# Patient Record
Sex: Female | Born: 1985
Health system: Southern US, Community
[De-identification: ages and names within clinical notes are randomized; demographics above are authoritative.]

## PROBLEM LIST (undated history)

## (undated) ENCOUNTER — Emergency Department (HOSPITAL_COMMUNITY): Discharge status: Absent without leave | Payer: Medicaid Other

## (undated) ENCOUNTER — Inpatient Hospital Stay (HOSPITAL_COMMUNITY): Payer: Self-pay

## (undated) DIAGNOSIS — Z789 Other specified health status: Secondary | ICD-10-CM

## (undated) DIAGNOSIS — IMO0002 Reserved for concepts with insufficient information to code with codable children: Secondary | ICD-10-CM

## (undated) DIAGNOSIS — O24419 Gestational diabetes mellitus in pregnancy, unspecified control: Secondary | ICD-10-CM

## (undated) DIAGNOSIS — E7800 Pure hypercholesterolemia, unspecified: Secondary | ICD-10-CM

## (undated) DIAGNOSIS — E119 Type 2 diabetes mellitus without complications: Secondary | ICD-10-CM

## (undated) DIAGNOSIS — E1165 Type 2 diabetes mellitus with hyperglycemia: Secondary | ICD-10-CM

## (undated) DIAGNOSIS — Z9289 Personal history of other medical treatment: Secondary | ICD-10-CM

## (undated) DIAGNOSIS — E0590 Thyrotoxicosis, unspecified without thyrotoxic crisis or storm: Secondary | ICD-10-CM

## (undated) HISTORY — DX: Personal history of other medical treatment: Z92.89

## (undated) HISTORY — DX: Type 2 diabetes mellitus without complications: E11.9

## (undated) HISTORY — DX: Reserved for concepts with insufficient information to code with codable children: IMO0002

## (undated) HISTORY — DX: Type 2 diabetes mellitus with hyperglycemia: E11.65

## (undated) HISTORY — PX: NO PAST SURGERIES: SHX2092

## (undated) HISTORY — DX: Pure hypercholesterolemia, unspecified: E78.00

## (undated) HISTORY — DX: Thyrotoxicosis, unspecified without thyrotoxic crisis or storm: E05.90

---

## 2007-01-29 DIAGNOSIS — Z9289 Personal history of other medical treatment: Secondary | ICD-10-CM

## 2007-01-29 HISTORY — DX: Personal history of other medical treatment: Z92.89

## 2008-03-17 ENCOUNTER — Ambulatory Visit (HOSPITAL_COMMUNITY): Admission: RE | Admit: 2008-03-17 | Discharge: 2008-03-17 | Payer: Self-pay | Admitting: Obstetrics & Gynecology

## 2008-08-09 ENCOUNTER — Inpatient Hospital Stay (HOSPITAL_COMMUNITY): Admission: AD | Admit: 2008-08-09 | Discharge: 2008-08-12 | Payer: Self-pay | Admitting: Family Medicine

## 2008-08-09 ENCOUNTER — Ambulatory Visit: Payer: Self-pay | Admitting: Advanced Practice Midwife

## 2010-03-19 ENCOUNTER — Inpatient Hospital Stay (INDEPENDENT_AMBULATORY_CARE_PROVIDER_SITE_OTHER)
Admission: RE | Admit: 2010-03-19 | Discharge: 2010-03-19 | Disposition: A | Discharge status: Home / Self Care | Payer: Self-pay | Source: Ambulatory Visit | Attending: Emergency Medicine | Admitting: Emergency Medicine

## 2010-03-19 DIAGNOSIS — L509 Urticaria, unspecified: Secondary | ICD-10-CM

## 2010-05-06 LAB — CBC
Platelets: 169 10*3/uL (ref 150–400)
RBC: 4.4 MIL/uL (ref 3.87–5.11)
RDW: 14.8 % (ref 11.5–15.5)
WBC: 10.4 10*3/uL (ref 4.0–10.5)
WBC: 9.6 10*3/uL (ref 4.0–10.5)

## 2010-05-06 LAB — URINE MICROSCOPIC-ADD ON

## 2010-05-06 LAB — URINALYSIS, ROUTINE W REFLEX MICROSCOPIC
Nitrite: NEGATIVE
Specific Gravity, Urine: <1.005 — ABNORMAL LOW (ref 1.005–1.030)
Urobilinogen, UA: 0.2 mg/dL (ref 0.0–1.0)
pH: 6 (ref 5.0–8.0)

## 2011-01-29 DIAGNOSIS — O24419 Gestational diabetes mellitus in pregnancy, unspecified control: Secondary | ICD-10-CM

## 2011-01-29 HISTORY — DX: Gestational diabetes mellitus in pregnancy, unspecified control: O24.419

## 2011-03-31 ENCOUNTER — Inpatient Hospital Stay (HOSPITAL_COMMUNITY)
Admission: AD | Admit: 2011-03-31 | Discharge: 2011-03-31 | Disposition: A | Discharge status: Home / Self Care | Payer: Medicaid Other | Source: Ambulatory Visit | Attending: Obstetrics & Gynecology | Admitting: Obstetrics & Gynecology

## 2011-03-31 ENCOUNTER — Inpatient Hospital Stay (HOSPITAL_COMMUNITY): Payer: Medicaid Other

## 2011-03-31 ENCOUNTER — Encounter (HOSPITAL_COMMUNITY): Payer: Self-pay

## 2011-03-31 ENCOUNTER — Emergency Department (INDEPENDENT_AMBULATORY_CARE_PROVIDER_SITE_OTHER)
Admission: EM | Admit: 2011-03-31 | Discharge: 2011-03-31 | Disposition: A | Discharge status: Other Acute Inpatient Hospital | Payer: Self-pay | Source: Home / Self Care | Attending: Emergency Medicine | Admitting: Emergency Medicine

## 2011-03-31 ENCOUNTER — Encounter (HOSPITAL_COMMUNITY): Payer: Self-pay | Admitting: *Deleted

## 2011-03-31 DIAGNOSIS — O209 Hemorrhage in early pregnancy, unspecified: Secondary | ICD-10-CM

## 2011-03-31 DIAGNOSIS — O039 Complete or unspecified spontaneous abortion without complication: Secondary | ICD-10-CM

## 2011-03-31 HISTORY — DX: Other specified health status: Z78.9

## 2011-03-31 LAB — POCT URINALYSIS DIP (DEVICE)
Bilirubin Urine: NEGATIVE
Glucose, UA: 500 mg/dL — AB
Ketones, ur: NEGATIVE mg/dL
Specific Gravity, Urine: 1.015 (ref 1.005–1.030)

## 2011-03-31 LAB — CBC
HCT: 38.5 % (ref 36.0–46.0)
Hemoglobin: 12.8 g/dL (ref 12.0–15.0)
MCV: 75.3 fL — ABNORMAL LOW (ref 78.0–100.0)
Platelets: 213 10*3/uL (ref 150–400)
RBC: 5.11 MIL/uL (ref 3.87–5.11)
WBC: 8.2 10*3/uL (ref 4.0–10.5)

## 2011-03-31 NOTE — Discharge Instructions (Signed)
Go to [facility] now. You need to make sure that the pregnancy is seen the right place. They are expecting you.

## 2011-03-31 NOTE — ED Provider Notes (Signed)
History     Chief Complaint  Patient presents with  . Vaginal Bleeding   HPI 26 y.o. G2P1 at Unknown EGA with low back pain and vaginal bleeding x 2 days. Heavier bleeding yesterday, today spotting. No abd pain. Presented to Urgent Care today, sent to MAU for further eval.    Past Medical History  Diagnosis Date  . No pertinent past medical history     No past surgical history on file.  No family history on file.  History  Substance Use Topics  . Smoking status: Never Smoker   . Smokeless tobacco: Not on file  . Alcohol Use: No    Allergies: No Known Allergies  Prescriptions prior to admission  Medication Sig Dispense Refill  . Multiple Vitamin (MULTIVITAMIN) capsule Take 1 capsule by mouth daily.        Review of Systems  Constitutional: Negative.   Respiratory: Negative.   Cardiovascular: Negative.   Gastrointestinal: Negative for nausea, vomiting, abdominal pain, diarrhea and constipation.  Genitourinary: Negative for dysuria, urgency, frequency, hematuria and flank pain.       Positive for vaginal bleeding  Musculoskeletal: Positive for back pain.  Neurological: Negative.   Psychiatric/Behavioral: Negative.    Physical Exam   Blood pressure 119/59, pulse 75, temperature 99 F (37.2 C), temperature source Oral, resp. rate 18, height 5\' 2"  (1.575 m), weight 157 lb 9.6 oz (71.487 kg), last menstrual period 01/21/2011.  Physical Exam  Constitutional: She is oriented to person, place, and time. She appears well-developed and well-nourished. No distress.  HENT:  Head: Normocephalic and atraumatic.  Cardiovascular: Normal rate, regular rhythm and normal heart sounds.   Respiratory: Effort normal and breath sounds normal. No respiratory distress.  GI: Soft. Bowel sounds are normal. She exhibits no distension and no mass. There is no tenderness. There is no rebound and no guarding.  Genitourinary: There is no rash or lesion on the right labia. There is no rash or  lesion on the left labia. Uterus is not deviated, not enlarged, not fixed and not tender. Cervix exhibits no motion tenderness, no discharge and no friability. Right adnexum displays no mass, no tenderness and no fullness. Left adnexum displays no mass, no tenderness and no fullness. There is bleeding (moderate) around the vagina. No erythema or tenderness around the vagina. No vaginal discharge found.       Cervix sl open  Neurological: She is alert and oriented to person, place, and time.  Skin: Skin is warm and dry.  Psychiatric: She has a normal mood and affect.    MAU Course  Procedures  Results for orders placed during the hospital encounter of 03/31/11 (from the past 24 hour(s))  CBC     Status: Abnormal   Collection Time   03/31/11 12:40 PM      Component Value Range   WBC 8.2  4.0 - 10.5 (K/uL)   RBC 5.11  3.87 - 5.11 (MIL/uL)   Hemoglobin 12.8  12.0 - 15.0 (g/dL)   HCT 56.2  13.0 - 86.5 (%)   MCV 75.3 (*) 78.0 - 100.0 (fL)   MCH 25.0 (*) 26.0 - 34.0 (pg)   MCHC 33.2  30.0 - 36.0 (g/dL)   RDW 78.4  69.6 - 29.5 (%)   Platelets 213  150 - 400 (K/uL)  ABO/RH     Status: Normal   Collection Time   03/31/11 12:40 PM      Component Value Range   ABO/RH(D) B POS  HCG, QUANTITATIVE, PREGNANCY     Status: Abnormal   Collection Time   03/31/11 12:40 PM      Component Value Range   hCG, Beta Chain, Quant, S 9282 (*) <5 (mIU/mL)   U/s: 6.3 week fetal pole, no cardiac activity, large subchorionic bleeding c/w failed IUP  Assessment and Plan  26 y.o. G2P1 with SAB at 6.[redacted] weeks EGA Offered Cytotec vs. Expectant mgmt, pt desires expectant management at this time F/U in MAU in 2 weeks, precautions rev'd  Elodie Panameno 03/31/2011, 1:23 PM

## 2011-03-31 NOTE — Progress Notes (Signed)
Pt report having vaginal bleeding x 2 days with back pain. Went to [facility] today and was sent to MAU for further evaluation.

## 2011-03-31 NOTE — ED Provider Notes (Signed)
Medical Screening exam and patient care preformed by advanced practice provider.  Agree with the above management.  

## 2011-03-31 NOTE — ED Provider Notes (Signed)
History     CSN: [id number]  Arrival date & time 03/31/11  7829   First MD Initiated Contact with Patient 03/31/11 [phone]      Chief Complaint  Patient presents with  . Possible Pregnancy    pt request preg test    (Consider location/radiation/quality/duration/timing/severity/associated sxs/prior treatment) HPI Comments: Patient is a G2 P1 who presents for painless vaginal bleeding starting yesterday. Reports some mild bilateral lower back pain. No nausea, vomiting, fevers, dysuria, urinary urgency, frequency, vaginal discharge. Patient denies having intercourse prior to the vaginal bleeding starting. Patient is 9 weeks 6 days pregnant by LMP, this diagnosis being pregnant about 2 weeks ago. Has had no prenatal care or ultrasound confirming location of pregnancy. No history of STDs, PID, ectopic pregnancy.  Patient is a 26 y.o. female presenting with vaginal bleeding. The history is provided by the spouse and the patient. The history is limited by a language barrier. No language interpreter was used.  Vaginal Bleeding This is a new problem. The current episode started yesterday. The problem has not changed since onset.Pertinent negatives include no abdominal pain. She has tried nothing for the symptoms. The treatment provided no relief.    History reviewed. No pertinent past medical history.  History reviewed. No pertinent past surgical history.  History reviewed. No pertinent family history.  History  Substance Use Topics  . Smoking status: Never Smoker   . Smokeless tobacco: Not on file  . Alcohol Use: No    OB History    Grav Para Term Preterm Abortions TAB SAB Ect Mult Living   2 1        1       Review of Systems  Constitutional: Negative for fever.  Gastrointestinal: Negative for nausea, vomiting and abdominal pain.  Genitourinary: Positive for vaginal bleeding. Negative for dysuria, frequency, hematuria, flank pain, vaginal pain and pelvic pain.  Neurological:  Negative for syncope.    Allergies  Review of patient's allergies indicates no known allergies.  Home Medications   Current Outpatient Rx  Name Route Sig Dispense Refill  . MULTIVITAMINS PO CAPS Oral Take 1 capsule by mouth daily.      BP 121/53  Pulse 77  Temp(Src) 98.6 F (37 C) (Oral)  Resp 18  SpO2 100%  LMP 01/21/2011  Physical Exam  Nursing note and vitals reviewed. Constitutional: She is oriented to person, place, and time. She appears well-developed and well-nourished. No distress.  HENT:  Head: Normocephalic and atraumatic.  Eyes: Conjunctivae and EOM are normal.  Neck: Normal range of motion.  Cardiovascular: Normal rate, regular rhythm and normal heart sounds.   Pulmonary/Chest: Effort normal and breath sounds normal.  Abdominal: Soft. Normal appearance and bowel sounds are normal. She exhibits no distension. There is no tenderness. There is no CVA tenderness.  Musculoskeletal: Normal range of motion.  Neurological: She is alert and oriented to person, place, and time.  Skin: Skin is warm and dry.  Psychiatric: She has a normal mood and affect. Her behavior is normal. Judgment and thought content normal.    ED Course  Procedures (including critical care time)  Labs Reviewed  POCT URINALYSIS DIP (DEVICE) - Abnormal; Notable for the following:    Glucose, UA 500 (*)    Hgb urine dipstick LARGE (*)    All other components within normal limits  POCT PREGNANCY, URINE - Abnormal; Notable for the following:    Preg Test, Ur POSITIVE (*)    All other components within normal limits  No results found.   1. Vaginal bleeding before [redacted] weeks gestation       MDM  Patient has had no prenatal care. Transferring to women's to rule out ectopic. Discussed case with intake nurse.   Luiz Blare, MD 03/31/11 [phone]

## 2011-03-31 NOTE — ED Notes (Signed)
Pt states she was seen 2 weeks ago and had a positive pregnancy test, they told her she was two months pregnant , states she started bleeding yesterday.

## 2011-04-01 LAB — GC/CHLAMYDIA PROBE AMP, GENITAL
Chlamydia, DNA Probe: NEGATIVE
GC Probe Amp, Genital: NEGATIVE

## 2011-04-14 ENCOUNTER — Inpatient Hospital Stay (HOSPITAL_COMMUNITY)
Admission: AD | Admit: 2011-04-14 | Discharge: 2011-04-14 | Disposition: A | Discharge status: Home / Self Care | Payer: Medicaid Other | Source: Ambulatory Visit | Attending: Obstetrics & Gynecology | Admitting: Obstetrics & Gynecology

## 2011-04-14 DIAGNOSIS — O039 Complete or unspecified spontaneous abortion without complication: Secondary | ICD-10-CM | POA: Insufficient documentation

## 2011-04-14 NOTE — MAU Note (Signed)
Pt here for f/u BHCG. Denies pain or vaginal bleeding.

## 2011-04-14 NOTE — MAU Provider Note (Signed)
  History     CSN: [id number]  Arrival date and time: 04/14/11 0913   None     Chief Complaint  Patient presents with  . Follow-up   HPI 26 y.o. G2P1 here for f/u quant 2 weeks s/p SAB. States bleeding stopped 1 week ago, denies pain or other complaints or concerns. Pt was seen in MAU with vaginal bleeding on 3/3, quant was 9282 at that time, u/s showed 6.3 week IUP with no cardiac activity. Pt opted for expectant management.     Past Medical History  Diagnosis Date  . No pertinent past medical history     No past surgical history on file.  No family history on file.  History  Substance Use Topics  . Smoking status: Never Smoker   . Smokeless tobacco: Not on file  . Alcohol Use: No    Allergies: No Known Allergies  Prescriptions prior to admission  Medication Sig Dispense Refill  . Multiple Vitamin (MULTIVITAMIN) capsule Take 1 capsule by mouth daily.        Review of Systems  Constitutional: Negative.   Respiratory: Negative.   Cardiovascular: Negative.   Gastrointestinal: Negative for nausea, vomiting, abdominal pain, diarrhea and constipation.  Genitourinary: Negative for dysuria, urgency, frequency, hematuria and flank pain.       Negative for vaginal bleeding, vaginal discharge  Musculoskeletal: Negative.   Neurological: Negative.   Psychiatric/Behavioral: Negative.    Physical Exam   Blood pressure 122/69, pulse 74, temperature 97.4 F (36.3 C), temperature source Oral, resp. rate 18, last menstrual period 01/21/2011.  Physical Exam  Nursing note and vitals reviewed. Constitutional: She is oriented to person, place, and time. She appears well-developed and well-nourished. No distress.  Cardiovascular: Normal rate.   Respiratory: Effort normal.  Musculoskeletal: Normal range of motion.  Neurological: She is alert and oriented to person, place, and time.  Skin: Skin is warm and dry.  Psychiatric: She has a normal mood and affect.    MAU Course   Procedures  Results for orders placed during the hospital encounter of 04/14/11 (from the past 24 hour(s))  HCG, QUANTITATIVE, PREGNANCY     Status: Abnormal   Collection Time   04/14/11  9:23 AM      Component Value Range   hCG, Beta Chain, Quant, S 16 (*) <5 (mIU/mL)     Assessment and Plan  26 y.o. G2P1 with SAB F/U PRN, gave info for GYN clinic   [facility] 04/14/2011, 9:25 AM

## 2011-04-14 NOTE — ED Notes (Signed)
Veronica Love,CNM discussed results with pt and husband both verbalized understanding.

## 2011-10-09 ENCOUNTER — Other Ambulatory Visit (HOSPITAL_COMMUNITY): Payer: Self-pay | Admitting: Family

## 2011-10-09 DIAGNOSIS — Z0489 Encounter for examination and observation for other specified reasons: Secondary | ICD-10-CM

## 2011-10-09 LAB — OB RESULTS CONSOLE HIV ANTIBODY (ROUTINE TESTING): HIV: NONREACTIVE

## 2011-10-09 LAB — OB RESULTS CONSOLE RUBELLA ANTIBODY, IGM: Rubella: IMMUNE

## 2011-10-09 LAB — OB RESULTS CONSOLE HEPATITIS B SURFACE ANTIGEN: Hepatitis B Surface Ag: NEGATIVE

## 2011-10-09 LAB — OB RESULTS CONSOLE PLATELET COUNT: Platelets: 172 10*3/uL

## 2011-10-09 LAB — OB RESULTS CONSOLE RPR: RPR: NONREACTIVE

## 2011-10-09 LAB — GLUCOSE, 3 HOUR

## 2011-10-09 LAB — OB RESULTS CONSOLE HGB/HCT, BLOOD: Hemoglobin: 11.9 g/dL

## 2011-10-09 LAB — SICKLE CELL SCREEN

## 2011-10-09 LAB — OB RESULTS CONSOLE ANTIBODY SCREEN: Antibody Screen: POSITIVE

## 2011-10-09 LAB — GLUCOSE, 1 HOUR: Glucose, 1 hour: 145

## 2011-10-10 LAB — OB RESULTS CONSOLE ANTIBODY SCREEN: Antibody Screen: POSITIVE

## 2011-10-14 ENCOUNTER — Ambulatory Visit (INDEPENDENT_AMBULATORY_CARE_PROVIDER_SITE_OTHER): Payer: Medicaid Other | Admitting: Obstetrics & Gynecology

## 2011-10-14 ENCOUNTER — Encounter: Payer: Medicaid Other | Attending: Obstetrics and Gynecology | Admitting: Dietician

## 2011-10-14 VITALS — Ht 60.0 in | Wt 155.8 lb

## 2011-10-14 DIAGNOSIS — O24419 Gestational diabetes mellitus in pregnancy, unspecified control: Secondary | ICD-10-CM

## 2011-10-14 DIAGNOSIS — O99810 Abnormal glucose complicating pregnancy: Secondary | ICD-10-CM

## 2011-10-14 DIAGNOSIS — Z713 Dietary counseling and surveillance: Secondary | ICD-10-CM | POA: Insufficient documentation

## 2011-10-14 LAB — POCT URINALYSIS DIP (DEVICE)
Bilirubin Urine: NEGATIVE
Leukocytes, UA: NEGATIVE
Nitrite: NEGATIVE
pH: 8.5 — ABNORMAL HIGH (ref 5.0–8.0)

## 2011-10-14 MED ORDER — ACCU-CHEK SOFTCLIX LANCETS MISC: Status: DC

## 2011-10-14 MED ORDER — GLUCOSE BLOOD VI STRP: ORAL_STRIP | Status: DC

## 2011-10-14 NOTE — Progress Notes (Signed)
Nutrition Note: 1st visit- RD/ CDE only Pt seen for GDM diet education. Pt has gained 0.8# @ ~[redacted]w[redacted]d (pt stated she is due 03/19/11), which is < expected. Pt given verbal & written GDM education with a translator Enid Derry) & vietnamese handouts. Pt reports eating 3 meals & 0-1 snack/d.  Pt reports N occ but no V.  Pt is taking PNV. Discussed wt gain goals of 11-20# or 0.5#/wk. Discussed importance of BF. Pt agrees to follow GDM diet including 3 meals & 3 snacks with proper CHO/pro combination. Pt does not receive WIC during this pregnancy but plans to apply. Pt stated she does not plan to BF but was open to at least trying in the hospital. F/u in 4-6 wks Blondell Reveal, MS, RD, LDN

## 2011-10-14 NOTE — Progress Notes (Signed)
Diabetes Education:  Seen today for GDM and blood glucose monitoring.  Assisted by the [place] interpreter.  Completed review of the recommended testing times and the glucose goals.  Provided an [employer] Lot [id number] Exp: 05/09/2014 with a Softclix lancing device and lancets Lot: [id number] Exp 2017-02.  She is to monitor fasting and two hours post-meal.  Today on return demonstration, her blood glucose fasting was 81 mg.  She was instructed to bring her meter and glucose log to all clinic appointments.  Maggie Binnie Vonderhaar, RN, RD, CDE

## 2011-10-15 DIAGNOSIS — O24419 Gestational diabetes mellitus in pregnancy, unspecified control: Secondary | ICD-10-CM | POA: Insufficient documentation

## 2011-10-16 ENCOUNTER — Other Ambulatory Visit (HOSPITAL_COMMUNITY): Payer: Self-pay | Admitting: Family

## 2011-10-16 ENCOUNTER — Ambulatory Visit (HOSPITAL_COMMUNITY)
Admission: RE | Admit: 2011-10-16 | Discharge: 2011-10-16 | Disposition: A | Discharge status: Home / Self Care | Payer: Medicaid Other | Source: Ambulatory Visit | Attending: Family | Admitting: Family

## 2011-10-16 DIAGNOSIS — O99810 Abnormal glucose complicating pregnancy: Secondary | ICD-10-CM | POA: Insufficient documentation

## 2011-10-16 DIAGNOSIS — O24419 Gestational diabetes mellitus in pregnancy, unspecified control: Secondary | ICD-10-CM

## 2011-10-16 DIAGNOSIS — Z0489 Encounter for examination and observation for other specified reasons: Secondary | ICD-10-CM

## 2011-10-16 DIAGNOSIS — Z3689 Encounter for other specified antenatal screening: Secondary | ICD-10-CM | POA: Insufficient documentation

## 2011-10-17 DIAGNOSIS — O24419 Gestational diabetes mellitus in pregnancy, unspecified control: Secondary | ICD-10-CM

## 2011-10-21 ENCOUNTER — Encounter: Payer: Self-pay | Admitting: Obstetrics & Gynecology

## 2011-10-21 ENCOUNTER — Ambulatory Visit (INDEPENDENT_AMBULATORY_CARE_PROVIDER_SITE_OTHER): Payer: Medicaid Other | Admitting: Obstetrics & Gynecology

## 2011-10-21 VITALS — BP 123/79 | Temp 98.0°F | Wt 156.5 lb

## 2011-10-21 DIAGNOSIS — O24419 Gestational diabetes mellitus in pregnancy, unspecified control: Secondary | ICD-10-CM

## 2011-10-21 DIAGNOSIS — O0990 Supervision of high risk pregnancy, unspecified, unspecified trimester: Secondary | ICD-10-CM

## 2011-10-21 DIAGNOSIS — O99810 Abnormal glucose complicating pregnancy: Secondary | ICD-10-CM

## 2011-10-21 DIAGNOSIS — O2340 Unspecified infection of urinary tract in pregnancy, unspecified trimester: Secondary | ICD-10-CM | POA: Insufficient documentation

## 2011-10-21 DIAGNOSIS — B951 Streptococcus, group B, as the cause of diseases classified elsewhere: Secondary | ICD-10-CM

## 2011-10-21 DIAGNOSIS — O2390 Unspecified genitourinary tract infection in pregnancy, unspecified trimester: Secondary | ICD-10-CM

## 2011-10-21 DIAGNOSIS — Z23 Encounter for immunization: Secondary | ICD-10-CM

## 2011-10-21 DIAGNOSIS — N390 Urinary tract infection, site not specified: Secondary | ICD-10-CM

## 2011-10-21 LAB — OB RESULTS CONSOLE GBS: GBS: POSITIVE

## 2011-10-21 LAB — POCT URINALYSIS DIP (DEVICE)
Bilirubin Urine: NEGATIVE
Hgb urine dipstick: NEGATIVE
Nitrite: NEGATIVE
Protein, ur: NEGATIVE mg/dL
pH: 5.5 (ref 5.0–8.0)

## 2011-10-21 MED ORDER — INFLUENZA VIRUS VACC SPLIT PF IM SUSP
0.5000 mL | INTRAMUSCULAR | Status: AC
  Administered 2011-10-21: 0.5 mL via INTRAMUSCULAR

## 2011-10-21 NOTE — Patient Instructions (Addendum)
Gestational Diabetes Mellitus Gestational diabetes mellitus (GDM) is diabetes that occurs only during pregnancy. This happens when the body cannot properly handle the glucose (sugar) that increases in the blood after eating. During pregnancy, insulin resistance (reduced sensitivity to insulin) occurs because of the release of hormones from the placenta. Usually, the pancreas of pregnant women produces enough insulin to overcome the resistance that occurs. However, in gestational diabetes, the insulin is there but it does not work effectively. If the resistance is severe enough that the pancreas does not produce enough insulin, extra glucose builds up in the blood.  WHO IS AT RISK FOR DEVELOPING GESTATIONAL DIABETES?  Women with a history of diabetes in the family.   Women over age 25.   Women who are overweight.   Women in certain ethnic groups (Hispanic, African American, Native American, Asian and Pacific Islander).  WHAT CAN HAPPEN TO THE BABY? If the mother's blood glucose is too high while she is pregnant, the extra sugar will travel through the umbilical cord to the baby. Some of the problems the baby may have are:  Large Baby - If the baby receives too much sugar, the baby will gain more weight. This may cause the baby to be too large to be born normally (vaginally) and a Cesarean section (C-section) may be needed.   Low Blood Glucose (hypoglycemia) - The baby makes extra insulin, in response to the extra sugar its gets from its mother. When the baby is born and no longer needs this extra insulin, the baby's blood glucose level may drop.   Jaundice (yellow coloring of the skin and eyes) - This is fairly common in babies. It is caused from a build-up of the chemical called bilirubin. This is rarely serious, but is seen more often in babies whose mothers had gestational diabetes.  RISKS TO THE MOTHER Women who have had gestational diabetes may be at higher risk for some problems,  including:  Preeclampsia or toxemia, which includes problems with high blood pressure. Blood pressure and protein levels in the urine must be checked frequently.   Infections.   Cesarean section (C-section) for delivery.   Developing Type 2 diabetes later in life. About 30-50% will develop diabetes later, especially if obese.  DIAGNOSIS  The hormones that cause insulin resistance are highest at about 24-28 weeks of pregnancy. If symptoms are experienced, they are much like symptoms you would normally expect during pregnancy.  GDM is often diagnosed using a two part method: 1. After 24-28 weeks of pregnancy, the woman drinks a glucose solution and takes a blood test. If the glucose level is high, a second test will be given.  2. Oral Glucose Tolerance Test (OGTT) which is 3 hours long - After not eating overnight, the blood glucose is checked. The woman drinks a glucose solution, and hourly blood glucose tests are taken.  If the woman has risk factors for GDM, the caregiver may test earlier than 24 weeks of pregnancy. TREATMENT  Treatment of GDM is directed at keeping the mother's blood glucose level normal, and may include:  Meal planning.   Taking insulin or other medicine to control your blood glucose level.   Exercise.   Keeping a daily record of the foods you eat.   Blood glucose monitoring and keeping a record of your blood glucose levels.   May monitor ketone levels in the urine, although this is no longer considered necessary in most pregnancies.  HOME CARE INSTRUCTIONS  While you are pregnant:    Follow your caregiver's advice regarding your prenatal appointments, meal planning, exercise, medicines, vitamins, blood and other tests, and physical activities.   Keep a record of your meals, blood glucose tests, and the amount of insulin you are taking (if any). Show this to your caregiver at every prenatal visit.   If you have GDM, you may have problems with hypoglycemia (low  blood glucose). You may suspect this if you become suddenly dizzy, feel shaky, and/or weak. If you think this is happening and you have a glucose meter, try to test your blood glucose level. Follow your caregiver's advice for when and how to treat your low blood glucose. Generally, the 15:15 rule is followed: Treat by consuming 15 grams of carbohydrates, wait 15 minutes, and recheck blood glucose. Examples of 15 grams of carbohydrates are:   1 cup skim or low-fat milk.    cup juice.   3-4 glucose tablets.   5-6 hard candies.   1 small box raisins.    cup regular soda pop.   Practice good hygiene, to avoid infections.   Do not smoke.  SEEK MEDICAL CARE IF:   You develop abnormal vaginal discharge, with or without itching.   You become weak and tired more than expected.   You seem to sweat a lot.   You have a sudden increase in weight, 5 pounds or more in one week.   You are losing weight, 3 pounds or more in a week.   Your blood glucose level is high, and you need instructions on what to do about it.  SEEK IMMEDIATE MEDICAL CARE IF:   You develop a severe headache.   You faint or pass out.   You develop nausea and vomiting.   You become disoriented or confused.   You have a convulsion.   You develop vision problems.   You develop stomach pain.   You develop vaginal bleeding.   You develop uterine contractions.   You have leaking or a gush of fluid from the vagina.  AFTER YOU HAVE THE BABY:  Go to all of your follow-up appointments, and have blood tests as advised by your caregiver.   Maintain a healthy lifestyle, to prevent diabetes in the future. This includes:   Following a healthy meal plan.   Controlling your weight.   Getting enough exercise and proper rest.   Do not smoke.   Breastfeed your baby if you can. This will lower the chance of you and your baby developing diabetes later in life.  For more information about diabetes, go to the American  Diabetes Association at: www.americandiabetesassociation.org. For more information about gestational diabetes, go to the American Congress of Obstetricians and Gynecologists at: www.acog.org. Document Released: 04/22/2000 Document Revised: 01/03/2011 Document Reviewed: 11/14/2008 ExitCare Patient Information 2012 [employer]. 

## 2011-10-21 NOTE — Progress Notes (Signed)
Nutrition note: f/u visit Pt has gained 1.5# @ [redacted]w[redacted]d, which is < expected. But pt gained 1.5# in 1 wk, which is > expected. Pt reports still only eating 3 meals & 1-2 snacks/ d & is often hungry. Pt stated she has noticed that her BS have been high after she eats a large amount of rice. Pt reports eating fruit alone as a snack. Pt unable to state food sources with cho vs pro only foods. Reviewed GDM diet & importance of having 3 meals & 3 snacks/ d with CHO & pro at all meals & snacks. Discussed pro sources pt could eat if hungry & has had her CHO allotment for that meal/ snack - ex nuts, cheese, egg, meat, peanut butter.   Pt agrees to decrease rice portions & add protein sources at all meals & snacks. F/u in 2-4 wks Blondell Reveal, MS, RD, LDN

## 2011-10-21 NOTE — Progress Notes (Signed)
U/S scheduled 11/04/11 at 930 am.

## 2011-10-21 NOTE — Progress Notes (Signed)
Transfer from [facility] abnormal early blood glucose, with 1 hr 145, 3 hr GTT 1 hr 236. Normal FBS now, pp are improving as she adjusts to diet. RTC 2 weeks. Detailed [place] 3 weeks.

## 2011-10-21 NOTE — Progress Notes (Signed)
Pulse 91 Desires flu vaccine, consent signed

## 2011-10-22 DIAGNOSIS — O24419 Gestational diabetes mellitus in pregnancy, unspecified control: Secondary | ICD-10-CM

## 2011-11-04 ENCOUNTER — Encounter: Payer: Medicaid Other | Admitting: Family Medicine

## 2011-11-04 ENCOUNTER — Ambulatory Visit (HOSPITAL_COMMUNITY)
Admission: RE | Admit: 2011-11-04 | Discharge: 2011-11-04 | Disposition: A | Discharge status: Home / Self Care | Payer: Medicaid Other | Source: Ambulatory Visit | Attending: Obstetrics & Gynecology | Admitting: Obstetrics & Gynecology

## 2011-11-04 ENCOUNTER — Ambulatory Visit (INDEPENDENT_AMBULATORY_CARE_PROVIDER_SITE_OTHER): Payer: Medicaid Other | Admitting: Family Medicine

## 2011-11-04 VITALS — BP 117/76 | Temp 97.5°F | Wt 156.3 lb

## 2011-11-04 DIAGNOSIS — Z363 Encounter for antenatal screening for malformations: Secondary | ICD-10-CM | POA: Insufficient documentation

## 2011-11-04 DIAGNOSIS — O358XX0 Maternal care for other (suspected) fetal abnormality and damage, not applicable or unspecified: Secondary | ICD-10-CM | POA: Insufficient documentation

## 2011-11-04 DIAGNOSIS — Z1389 Encounter for screening for other disorder: Secondary | ICD-10-CM | POA: Insufficient documentation

## 2011-11-04 DIAGNOSIS — O99810 Abnormal glucose complicating pregnancy: Secondary | ICD-10-CM | POA: Insufficient documentation

## 2011-11-04 DIAGNOSIS — O24419 Gestational diabetes mellitus in pregnancy, unspecified control: Secondary | ICD-10-CM

## 2011-11-04 LAB — POCT URINALYSIS DIP (DEVICE)
Hgb urine dipstick: NEGATIVE
Ketones, ur: NEGATIVE mg/dL
Protein, ur: NEGATIVE mg/dL
Specific Gravity, Urine: 1.01 (ref 1.005–1.030)

## 2011-11-04 MED ORDER — GLUCOSE BLOOD VI STRP: ORAL_STRIP | Status: DC

## 2011-11-04 MED ORDER — ACCU-CHEK SOFTCLIX LANCETS MISC: Status: DC

## 2011-11-04 NOTE — Progress Notes (Signed)
Fasting BS all in 70s or low 80s. PP 65-185 (5/36 over 120). Majority 70s to 100s. Continue diet control. Ultrasound (fetal survey) normal. No cramping, bleeding, headache, vision changes.

## 2011-11-04 NOTE — Patient Instructions (Signed)
Call if blood sugars are repeatedly over 95 fasting or over 120 after meals, call.   Gestational Diabetes Mellitus Gestational diabetes mellitus (GDM) is diabetes that occurs only during pregnancy. This happens when the body cannot properly handle the glucose (sugar) that increases in the blood after eating. During pregnancy, insulin resistance (reduced sensitivity to insulin) occurs because of the release of hormones from the placenta. Usually, the pancreas of pregnant women produces enough insulin to overcome the resistance that occurs. However, in gestational diabetes, the insulin is there but it does not work effectively. If the resistance is severe enough that the pancreas does not produce enough insulin, extra glucose builds up in the blood.  WHO IS AT RISK FOR DEVELOPING GESTATIONAL DIABETES?  Women with a history of diabetes in the family.  Women over age 36.  Women who are overweight.  Women in certain ethnic groups (Hispanic, African American, Native American, [place] and [place]er). WHAT CAN HAPPEN TO THE BABY? If the mother's blood glucose is too high while she is pregnant, the extra sugar will travel through the umbilical cord to the baby. Some of the problems the baby may have are:  Large Baby - If the baby receives too much sugar, the baby will gain more weight. This may cause the baby to be too large to be born normally (vaginally) and a Cesarean section (C-section) may be needed.  Low Blood Glucose (hypoglycemia)  The baby makes extra insulin, in response to the extra sugar its gets from its mother. When the baby is born and no longer needs this extra insulin, the baby's blood glucose level may drop.  Jaundice (yellow coloring of the skin and eyes)  This is fairly common in babies. It is caused from a build-up of the chemical called bilirubin. This is rarely serious, but is seen more often in babies whose mothers had gestational diabetes. RISKS TO THE MOTHER Women who have  had gestational diabetes may be at higher risk for some problems, including:  Preeclampsia or toxemia, which includes problems with high blood pressure. Blood pressure and protein levels in the urine must be checked frequently.  Infections.  Cesarean section (C-section) for delivery.  Developing Type 2 diabetes later in life. About 30-50% will develop diabetes later, especially if obese. DIAGNOSIS  The hormones that cause insulin resistance are highest at about 24-28 weeks of pregnancy. If symptoms are experienced, they are much like symptoms you would normally expect during pregnancy.  GDM is often diagnosed using a two part method: 1. After 24-28 weeks of pregnancy, the woman drinks a glucose solution and takes a blood test. If the glucose level is high, a second test will be given. 2. Oral Glucose Tolerance Test (OGTT) which is 3 hours long  After not eating overnight, the blood glucose is checked. The woman drinks a glucose solution, and hourly blood glucose tests are taken. If the woman has risk factors for GDM, the caregiver may test earlier than 24 weeks of pregnancy. TREATMENT  Treatment of GDM is directed at keeping the mother's blood glucose level normal, and may include:  Meal planning.  Taking insulin or other medicine to control your blood glucose level.  Exercise.  Keeping a daily record of the foods you eat.  Blood glucose monitoring and keeping a record of your blood glucose levels.  May monitor ketone levels in the urine, although this is no longer considered necessary in most pregnancies. HOME CARE INSTRUCTIONS  While you are pregnant:  Follow your  caregiver's advice regarding your prenatal appointments, meal planning, exercise, medicines, vitamins, blood and other tests, and physical activities.  Keep a record of your meals, blood glucose tests, and the amount of insulin you are taking (if any). Show this to your caregiver at every prenatal visit.  If you have  GDM, you may have problems with hypoglycemia (low blood glucose). You may suspect this if you become suddenly dizzy, feel shaky, and/or weak. If you think this is happening and you have a glucose meter, try to test your blood glucose level. Follow your caregiver's advice for when and how to treat your low blood glucose. Generally, the 15:15 rule is followed: Treat by consuming 15 grams of carbohydrates, wait 15 minutes, and recheck blood glucose. Examples of 15 grams of carbohydrates are:  1 cup skim or low-fat milk.   cup juice.  3-4 glucose tablets.  5-6 hard candies.  1 small box raisins.   cup regular soda pop.  Practice good hygiene, to avoid infections.  Do not smoke. SEEK MEDICAL CARE IF:   You develop abnormal vaginal discharge, with or without itching.  You become weak and tired more than expected.  You seem to sweat a lot.  You have a sudden increase in weight, 5 pounds or more in one week.  You are losing weight, 3 pounds or more in a week.  Your blood glucose level is high, and you need instructions on what to do about it. SEEK IMMEDIATE MEDICAL CARE IF:   You develop a severe headache.  You faint or pass out.  You develop nausea and vomiting.  You become disoriented or confused.  You have a convulsion.  You develop vision problems.  You develop stomach pain.  You develop vaginal bleeding.  You develop uterine contractions.  You have leaking or a gush of fluid from the vagina. AFTER YOU HAVE THE BABY:  Go to all of your follow-up appointments, and have blood tests as advised by your caregiver.  Maintain a healthy lifestyle, to prevent diabetes in the future. This includes:  Following a healthy meal plan.  Controlling your weight.  Getting enough exercise and proper rest.  Do not smoke.  Breastfeed your baby if you can. This will lower the chance of you and your baby developing diabetes later in life. For more information about diabetes,  go to the American Diabetes Association at: PMFashions.com.cy. For more information about gestational diabetes, go to the [employer] at: RentRule.com.au. Document Released: 04/22/2000 Document Revised: 04/08/2011 Document Reviewed: 11/14/2008 [facility] Patient Information 2013 [place], [place].   Pregnancy - Second Trimester The second trimester of pregnancy (3 to 6 months) is a period of rapid growth for you and your baby. At the end of the sixth month, your baby is about 9 inches long and weighs 1 1/2 pounds. You will begin to feel the baby move between 18 and 20 weeks of the pregnancy. This is called quickening. Weight gain is faster. A clear fluid (colostrum) may leak out of your breasts. You may feel small contractions of the womb (uterus). This is known as false labor or Braxton-Hicks contractions. This is like a practice for labor when the baby is ready to be born. Usually, the problems with morning sickness have usually passed by the end of your first trimester. Some women develop small dark blotches (called cholasma, mask of pregnancy) on their face that usually goes away after the baby is born. Exposure to the sun makes the blotches  worse. Acne may also develop in some pregnant women and pregnant women who have acne, may find that it goes away. PRENATAL EXAMS  Blood work may continue to be done during prenatal exams. These tests are done to check on your health and the probable health of your baby. Blood work is used to follow your blood levels (hemoglobin). Anemia (low hemoglobin) is common during pregnancy. Iron and vitamins are given to help prevent this. You will also be checked for diabetes between 24 and 28 weeks of the pregnancy. Some of the previous blood tests may be repeated.  The size of the uterus is measured during each visit. This is to make sure that the baby is continuing to grow properly according to the dates of the  pregnancy.  Your blood pressure is checked every prenatal visit. This is to make sure you are not getting toxemia.  Your urine is checked to make sure you do not have an infection, diabetes or protein in the urine.  Your weight is checked often to make sure gains are happening at the suggested rate. This is to ensure that both you and your baby are growing normally.  Sometimes, an ultrasound is performed to confirm the proper growth and development of the baby. This is a test which bounces harmless sound waves off the baby so your caregiver can more accurately determine due dates. Sometimes, a specialized test is done on the amniotic fluid surrounding the baby. This test is called an amniocentesis. The amniotic fluid is obtained by sticking a needle into the belly (abdomen). This is done to check the chromosomes in instances where there is a concern about possible genetic problems with the baby. It is also sometimes done near the end of pregnancy if an early delivery is required. In this case, it is done to help make sure the baby's lungs are mature enough for the baby to live outside of the womb. CHANGES OCCURING IN THE SECOND TRIMESTER OF PREGNANCY Your body goes through many changes during pregnancy. They vary from person to person. Talk to your caregiver about changes you notice that you are concerned about.  During the second trimester, you will likely have an increase in your appetite. It is normal to have cravings for certain foods. This varies from person to person and pregnancy to pregnancy.  Your lower abdomen will begin to bulge.  You may have to urinate more often because the uterus and baby are pressing on your bladder. It is also common to get more bladder infections during pregnancy (pain with urination). You can help this by drinking lots of fluids and emptying your bladder before and after intercourse.  You may begin to get stretch marks on your hips, abdomen, and breasts. These  are normal changes in the body during pregnancy. There are no exercises or medications to take that prevent this change.  You may begin to develop swollen and bulging veins (varicose veins) in your legs. Wearing support hose, elevating your feet for 15 minutes, 3 to 4 times a day and limiting salt in your diet helps lessen the problem.  Heartburn may develop as the uterus grows and pushes up against the stomach. Antacids recommended by your caregiver helps with this problem. Also, eating smaller meals 4 to 5 times a day helps.  Constipation can be treated with a stool softener or adding bulk to your diet. Drinking lots of fluids, vegetables, fruits, and whole grains are helpful.  Exercising is also helpful. If you have  been very active up until your pregnancy, most of these activities can be continued during your pregnancy. If you have been less active, it is helpful to start an exercise program such as walking.  Hemorrhoids (varicose veins in the rectum) may develop at the end of the second trimester. Warm sitz baths and hemorrhoid cream recommended by your caregiver helps hemorrhoid problems.  Backaches may develop during this time of your pregnancy. Avoid heavy lifting, wear low heal shoes and practice good posture to help with backache problems.  Some pregnant women develop tingling and numbness of their hand and fingers because of swelling and tightening of ligaments in the wrist (carpel tunnel syndrome). This goes away after the baby is born.  As your breasts enlarge, you may have to get a bigger bra. Get a comfortable, cotton, support bra. Do not get a nursing bra until the last month of the pregnancy if you will be nursing the baby.  You may get a dark line from your belly button to the pubic area called the linea nigra.  You may develop rosy cheeks because of increase blood flow to the face.  You may develop spider looking lines of the face, neck, arms and chest. These go away after  the baby is born. HOME CARE INSTRUCTIONS   It is extremely important to avoid all smoking, herbs, alcohol, and unprescribed drugs during your pregnancy. These chemicals affect the formation and growth of the baby. Avoid these chemicals throughout the pregnancy to ensure the delivery of a healthy infant.  Most of your home care instructions are the same as suggested for the first trimester of your pregnancy. Keep your caregiver's appointments. Follow your caregiver's instructions regarding medication use, exercise and diet.  During pregnancy, you are providing food for you and your baby. Continue to eat regular, well-balanced meals. Choose foods such as meat, fish, milk and other low fat dairy products, vegetables, fruits, and whole-grain breads and cereals. Your caregiver will tell you of the ideal weight gain.  A physical sexual relationship may be continued up until near the end of pregnancy if there are no other problems. Problems could include early (premature) leaking of amniotic fluid from the membranes, vaginal bleeding, abdominal pain, or other medical or pregnancy problems.  Exercise regularly if there are no restrictions. Check with your caregiver if you are unsure of the safety of some of your exercises. The greatest weight gain will occur in the last 2 trimesters of pregnancy. Exercise will help you:  Control your weight.  Get you in shape for labor and delivery.  Lose weight after you have the baby.  Wear a good support or jogging bra for breast tenderness during pregnancy. This may help if worn during sleep. Pads or tissues may be used in the bra if you are leaking colostrum.  Do not use hot tubs, steam rooms or saunas throughout the pregnancy.  Wear your seat belt at all times when driving. This protects you and your baby if you are in an accident.  Avoid raw meat, uncooked cheese, cat litter boxes and soil used by cats. These carry germs that can cause birth defects in the  baby.  The second trimester is also a good time to visit your dentist for your dental health if this has not been done yet. Getting your teeth cleaned is OK. Use a soft toothbrush. Brush gently during pregnancy.  It is easier to loose urine during pregnancy. Tightening up and strengthening the pelvic muscles will help  with this problem. Practice stopping your urination while you are going to the bathroom. These are the same muscles you need to strengthen. It is also the muscles you would use as if you were trying to stop from passing gas. You can practice tightening these muscles up 10 times a set and repeating this about 3 times per day. Once you know what muscles to tighten up, do not perform these exercises during urination. It is more likely to contribute to an infection by backing up the urine.  Ask for help if you have financial, counseling or nutritional needs during pregnancy. Your caregiver will be able to offer counseling for these needs as well as refer you for other special needs.  Your skin may become oily. If so, wash your face with mild soap, use non-greasy moisturizer and oil or cream based makeup. MEDICATIONS AND DRUG USE IN PREGNANCY  Take prenatal vitamins as directed. The vitamin should contain 1 milligram of folic acid. Keep all vitamins out of reach of children. Only a couple vitamins or tablets containing iron may be fatal to a baby or young child when ingested.  Avoid use of all medications, including herbs, over-the-counter medications, not prescribed or suggested by your caregiver. Only take over-the-counter or prescription medicines for pain, discomfort, or fever as directed by your caregiver. Do not use aspirin.  Let your caregiver also know about herbs you may be using.  Alcohol is related to a number of birth defects. This includes fetal alcohol syndrome. All alcohol, in any form, should be avoided completely. Smoking will cause low birth rate and premature  babies.  Street or illegal drugs are very harmful to the baby. They are absolutely forbidden. A baby born to an addicted mother will be addicted at birth. The baby will go through the same withdrawal an adult does. SEEK MEDICAL CARE IF:  You have any concerns or worries during your pregnancy. It is better to call with your questions if you feel they cannot wait, rather than worry about them. SEEK IMMEDIATE MEDICAL CARE IF:   An unexplained oral temperature above 102 F (38.9 C) develops, or as your caregiver suggests.  You have leaking of fluid from the vagina (birth canal). If leaking membranes are suspected, take your temperature and tell your caregiver of this when you call.  There is vaginal spotting, bleeding, or passing clots. Tell your caregiver of the amount and how many pads are used. Light spotting in pregnancy is common, especially following intercourse.  You develop a bad smelling vaginal discharge with a change in the color from clear to white.  You continue to feel sick to your stomach (nauseated) and have no relief from remedies suggested. You vomit blood or coffee ground-like materials.  You lose more than 2 pounds of weight or gain more than 2 pounds of weight over 1 week, or as suggested by your caregiver.  You notice swelling of your face, hands, feet, or legs.  You get exposed to [place] measles and have never had them.  You are exposed to fifth disease or chickenpox.  You develop belly (abdominal) pain. Round ligament discomfort is a common non-cancerous (benign) cause of abdominal pain in pregnancy. Your caregiver still must evaluate you.  You develop a bad headache that does not go away.  You develop fever, diarrhea, pain with urination, or shortness of breath.  You develop visual problems, blurry, or double vision.  You fall or are in a car accident or any kind of trauma.  There is mental or physical violence at home. Document Released: 01/08/2001 Document  Revised: 04/08/2011 Document Reviewed: 07/13/2008 [facility] Patient Information 2013 [place], [place].

## 2011-11-04 NOTE — Progress Notes (Signed)
Pulse: 88

## 2011-11-25 ENCOUNTER — Ambulatory Visit (INDEPENDENT_AMBULATORY_CARE_PROVIDER_SITE_OTHER): Payer: Medicaid Other | Admitting: Obstetrics and Gynecology

## 2011-11-25 VITALS — BP 114/74 | Temp 97.7°F | Wt 158.8 lb

## 2011-11-25 DIAGNOSIS — O0990 Supervision of high risk pregnancy, unspecified, unspecified trimester: Secondary | ICD-10-CM

## 2011-11-25 DIAGNOSIS — O24419 Gestational diabetes mellitus in pregnancy, unspecified control: Secondary | ICD-10-CM

## 2011-11-25 DIAGNOSIS — O99810 Abnormal glucose complicating pregnancy: Secondary | ICD-10-CM

## 2011-11-25 LAB — POCT URINALYSIS DIP (DEVICE)
Bilirubin Urine: NEGATIVE
Glucose, UA: NEGATIVE mg/dL
Hgb urine dipstick: NEGATIVE
Specific Gravity, Urine: 1.01 (ref 1.005–1.030)
Urobilinogen, UA: 0.2 mg/dL (ref 0.0–1.0)

## 2011-11-25 NOTE — Progress Notes (Signed)
Fetal Echo scheduled with Dr. Elizebeth Brooking Nov. 4th at 11 am. Auth # [id number].

## 2011-11-25 NOTE — Progress Notes (Signed)
Pulse: 89

## 2011-11-25 NOTE — Progress Notes (Signed)
CBG f 69-83 2hr pp 98-168 (4/16 abnormal mainly after dinner). Patient doing well without complaints. Advised to pay attention to diet at dinner. Will schedule fetal echo

## 2011-12-09 ENCOUNTER — Ambulatory Visit (INDEPENDENT_AMBULATORY_CARE_PROVIDER_SITE_OTHER): Payer: Medicaid Other | Admitting: Advanced Practice Midwife

## 2011-12-09 VITALS — BP 109/69 | Temp 97.8°F | Wt 159.6 lb

## 2011-12-09 DIAGNOSIS — O2390 Unspecified genitourinary tract infection in pregnancy, unspecified trimester: Secondary | ICD-10-CM

## 2011-12-09 DIAGNOSIS — O99810 Abnormal glucose complicating pregnancy: Secondary | ICD-10-CM

## 2011-12-09 DIAGNOSIS — O0990 Supervision of high risk pregnancy, unspecified, unspecified trimester: Secondary | ICD-10-CM

## 2011-12-09 LAB — POCT URINALYSIS DIP (DEVICE)
Bilirubin Urine: NEGATIVE
Hgb urine dipstick: NEGATIVE
pH: 7 (ref 5.0–8.0)

## 2011-12-09 NOTE — Progress Notes (Signed)
Pulse: 89

## 2011-12-09 NOTE — Progress Notes (Signed)
Doing well.  Good fetal movement, denies vaginal bleeding, LOF, regular contractions.  Pt forgot blood sugar log but reports fasting sugars in 90s and after meals ranging from 110-150s.  Discussed diet, need to bring log next visit to review.

## 2011-12-30 ENCOUNTER — Encounter: Payer: Self-pay | Admitting: Obstetrics & Gynecology

## 2011-12-30 ENCOUNTER — Ambulatory Visit (INDEPENDENT_AMBULATORY_CARE_PROVIDER_SITE_OTHER): Payer: Medicaid Other | Admitting: Obstetrics & Gynecology

## 2011-12-30 VITALS — BP 105/72 | Temp 97.0°F | Wt 162.5 lb

## 2011-12-30 DIAGNOSIS — O0990 Supervision of high risk pregnancy, unspecified, unspecified trimester: Secondary | ICD-10-CM

## 2011-12-30 DIAGNOSIS — O24419 Gestational diabetes mellitus in pregnancy, unspecified control: Secondary | ICD-10-CM

## 2011-12-30 DIAGNOSIS — O99810 Abnormal glucose complicating pregnancy: Secondary | ICD-10-CM

## 2011-12-30 LAB — POCT URINALYSIS DIP (DEVICE)
Ketones, ur: 15 mg/dL — AB
Protein, ur: NEGATIVE mg/dL
Specific Gravity, Urine: 1.01 (ref 1.005–1.030)
pH: 7 (ref 5.0–8.0)

## 2011-12-30 NOTE — Progress Notes (Signed)
Pulse- 96 

## 2011-12-30 NOTE — Progress Notes (Signed)
Routine visit. Good FM. No OB problems. Good sugars on no meds. Almost all fbs less than 90 and 2 hours less than 130. Routine labs at next visit.

## 2012-01-13 ENCOUNTER — Ambulatory Visit (INDEPENDENT_AMBULATORY_CARE_PROVIDER_SITE_OTHER): Payer: Self-pay | Admitting: Family

## 2012-01-13 ENCOUNTER — Encounter: Payer: Self-pay | Attending: Obstetrics & Gynecology | Admitting: Dietician

## 2012-01-13 VITALS — BP 98/66 | Temp 98.1°F | Wt 162.7 lb

## 2012-01-13 DIAGNOSIS — O24419 Gestational diabetes mellitus in pregnancy, unspecified control: Secondary | ICD-10-CM

## 2012-01-13 DIAGNOSIS — O0990 Supervision of high risk pregnancy, unspecified, unspecified trimester: Secondary | ICD-10-CM

## 2012-01-13 DIAGNOSIS — O99810 Abnormal glucose complicating pregnancy: Secondary | ICD-10-CM | POA: Insufficient documentation

## 2012-01-13 DIAGNOSIS — Z713 Dietary counseling and surveillance: Secondary | ICD-10-CM | POA: Insufficient documentation

## 2012-01-13 LAB — POCT URINALYSIS DIP (DEVICE)
Hgb urine dipstick: NEGATIVE
Protein, ur: NEGATIVE mg/dL
Specific Gravity, Urine: 1.01 (ref 1.005–1.030)
Urobilinogen, UA: 0.2 mg/dL (ref 0.0–1.0)
pH: 6.5 (ref 5.0–8.0)

## 2012-01-13 MED ORDER — GLYBURIDE 2.5 MG PO TABS: 2.5000 mg | ORAL_TABLET | Freq: Two times a day (BID) | ORAL | Status: DC

## 2012-01-13 NOTE — Progress Notes (Signed)
p=102 

## 2012-01-13 NOTE — Progress Notes (Signed)
Diabetes Education:  Reports using the [employer] interpreter, that at her last pharmacy visit, she was told that she no longer had the Medicaid to cover her strips and lancets.  On checking with the DSS representative off of the High Risk lobby, it was discovered that indeed, her Medicaid in not in effect, and she needs to see her social worker with necessary information to enable her to continue to use Medicaid.  The DSS lady gave her the name and number of the social worker to contact.  I have supplied her with a True Track meter [id number] Exp: 2013/12/27 and 1 box of strips Lot: [id number] Exp: 2014/03/28 and 1 box of strips Lot: [id number] Exp: 2015/07/01.  Review of the meter and on return demonstration, her Bl glucose was 100 mg/dl.  Possibly had a piece of candy earlier.  Maggie Elly Haffey, RN, RD, CDE

## 2012-01-13 NOTE — Progress Notes (Signed)
FBS 92-101 (3/6 abnl); B 110-146 (4/6 abnl), L 110-187 (3/6), 130-190 (5/6); Pt states unable to check blood sugar in past week due to not having strips; begin Glyburide 2.5mg  BID; Meet with Seward Grater to review diet again and obtain additional strips.  Results show positive antibody screen, however type not easily identifiable > request Lisa follow-up with type.

## 2012-01-13 NOTE — Addendum Note (Signed)
Addended by: Melissa Noon on: 01/13/2012 04:50 PM   Modules accepted: Orders

## 2012-01-13 NOTE — Progress Notes (Signed)
UNABLE TO OBTAIN ANTIBODY TYPE WITH RESEARCH > OBTAIN AT NEXT VISIT

## 2012-01-20 ENCOUNTER — Ambulatory Visit (INDEPENDENT_AMBULATORY_CARE_PROVIDER_SITE_OTHER): Payer: Self-pay | Admitting: Obstetrics and Gynecology

## 2012-01-20 ENCOUNTER — Encounter: Payer: Self-pay | Admitting: Obstetrics and Gynecology

## 2012-01-20 ENCOUNTER — Encounter: Payer: Medicaid Other | Attending: Obstetrics & Gynecology | Admitting: Dietician

## 2012-01-20 VITALS — BP 113/70 | Temp 97.1°F | Wt 166.0 lb

## 2012-01-20 DIAGNOSIS — B951 Streptococcus, group B, as the cause of diseases classified elsewhere: Secondary | ICD-10-CM

## 2012-01-20 DIAGNOSIS — O361990 Maternal care for other isoimmunization, unspecified trimester, not applicable or unspecified: Secondary | ICD-10-CM

## 2012-01-20 DIAGNOSIS — O99810 Abnormal glucose complicating pregnancy: Secondary | ICD-10-CM

## 2012-01-20 DIAGNOSIS — O2390 Unspecified genitourinary tract infection in pregnancy, unspecified trimester: Secondary | ICD-10-CM

## 2012-01-20 DIAGNOSIS — O24419 Gestational diabetes mellitus in pregnancy, unspecified control: Secondary | ICD-10-CM

## 2012-01-20 DIAGNOSIS — Z713 Dietary counseling and surveillance: Secondary | ICD-10-CM | POA: Insufficient documentation

## 2012-01-20 DIAGNOSIS — O2340 Unspecified infection of urinary tract in pregnancy, unspecified trimester: Secondary | ICD-10-CM

## 2012-01-20 DIAGNOSIS — N390 Urinary tract infection, site not specified: Secondary | ICD-10-CM

## 2012-01-20 DIAGNOSIS — O361190 Maternal care for Anti-A sensitization, unspecified trimester, not applicable or unspecified: Secondary | ICD-10-CM

## 2012-01-20 LAB — CBC
HCT: 35.4 % — ABNORMAL LOW (ref 36.0–46.0)
Hemoglobin: 11.7 g/dL — ABNORMAL LOW (ref 12.0–15.0)
MCH: 23.9 pg — ABNORMAL LOW (ref 26.0–34.0)
MCHC: 33.1 g/dL (ref 30.0–36.0)

## 2012-01-20 LAB — RPR

## 2012-01-20 LAB — POCT URINALYSIS DIP (DEVICE)
Bilirubin Urine: NEGATIVE
Hgb urine dipstick: NEGATIVE
Nitrite: NEGATIVE
pH: 6.5 (ref 5.0–8.0)

## 2012-01-20 NOTE — Progress Notes (Signed)
Diabetes Education:  Seen today for increased post-meal glucose levels at breakfast and dinner.  Eating later breakfast with increased intake of rice along with larger servings at her evening meal.  Does not have a real schedule.  Notes that she eats when she is hungry.  Counseled regarding use of protein, non-starchy vegetables at meals and to use some of the amount of food she is using at meal time and use as a snack.  Recommended eating an earlier breakfast.  Review of the need to increase the Glyburide in the AM to 5 mg or 2 tabs.  Maggie Shine Mikes, RN, RD, CDE

## 2012-01-20 NOTE — Progress Notes (Signed)
Pulse- 96 

## 2012-01-20 NOTE — Patient Instructions (Signed)
Gestational Diabetes Mellitus Gestational diabetes mellitus (GDM) is diabetes that occurs only during pregnancy. This happens when the body cannot properly handle the glucose (sugar) that increases in the blood after eating. During pregnancy, insulin resistance (reduced sensitivity to insulin) occurs because of the release of hormones from the placenta. Usually, the pancreas of pregnant women produces enough insulin to overcome the resistance that occurs. However, in gestational diabetes, the insulin is there but it does not work effectively. If the resistance is severe enough that the pancreas does not produce enough insulin, extra glucose builds up in the blood.  WHO IS AT RISK FOR DEVELOPING GESTATIONAL DIABETES?  Women with a history of diabetes in the family.  Women over age 25.  Women who are overweight.  Women in certain ethnic groups (Hispanic, African American, Native American, Asian and Pacific Islander). WHAT CAN HAPPEN TO THE BABY? If the mother's blood glucose is too high while she is pregnant, the extra sugar will travel through the umbilical cord to the baby. Some of the problems the baby may have are:  Large Baby - If the baby receives too much sugar, the baby will gain more weight. This may cause the baby to be too large to be born normally (vaginally) and a Cesarean section (C-section) may be needed.  Low Blood Glucose (hypoglycemia)  The baby makes extra insulin, in response to the extra sugar its gets from its mother. When the baby is born and no longer needs this extra insulin, the baby's blood glucose level may drop.  Jaundice (yellow coloring of the skin and eyes)  This is fairly common in babies. It is caused from a build-up of the chemical called bilirubin. This is rarely serious, but is seen more often in babies whose mothers had gestational diabetes. RISKS TO THE MOTHER Women who have had gestational diabetes may be at higher risk for some problems,  including:  Preeclampsia or toxemia, which includes problems with high blood pressure. Blood pressure and protein levels in the urine must be checked frequently.  Infections.  Cesarean section (C-section) for delivery.  Developing Type 2 diabetes later in life. About 30-50% will develop diabetes later, especially if obese. DIAGNOSIS  The hormones that cause insulin resistance are highest at about 24-28 weeks of pregnancy. If symptoms are experienced, they are much like symptoms you would normally expect during pregnancy.  GDM is often diagnosed using a two part method: 1. After 24-28 weeks of pregnancy, the woman drinks a glucose solution and takes a blood test. If the glucose level is high, a second test will be given. 2. Oral Glucose Tolerance Test (OGTT) which is 3 hours long  After not eating overnight, the blood glucose is checked. The woman drinks a glucose solution, and hourly blood glucose tests are taken. If the woman has risk factors for GDM, the caregiver may test earlier than 24 weeks of pregnancy. TREATMENT  Treatment of GDM is directed at keeping the mother's blood glucose level normal, and may include:  Meal planning.  Taking insulin or other medicine to control your blood glucose level.  Exercise.  Keeping a daily record of the foods you eat.  Blood glucose monitoring and keeping a record of your blood glucose levels.  May monitor ketone levels in the urine, although this is no longer considered necessary in most pregnancies. HOME CARE INSTRUCTIONS  While you are pregnant:  Follow your caregiver's advice regarding your prenatal appointments, meal planning, exercise, medicines, vitamins, blood and other tests, and physical   activities.  Keep a record of your meals, blood glucose tests, and the amount of insulin you are taking (if any). Show this to your caregiver at every prenatal visit.  If you have GDM, you may have problems with hypoglycemia (low blood glucose).  You may suspect this if you become suddenly dizzy, feel shaky, and/or weak. If you think this is happening and you have a glucose meter, try to test your blood glucose level. Follow your caregiver's advice for when and how to treat your low blood glucose. Generally, the 15:15 rule is followed: Treat by consuming 15 grams of carbohydrates, wait 15 minutes, and recheck blood glucose. Examples of 15 grams of carbohydrates are:  1 cup skim or low-fat milk.   cup juice.  3-4 glucose tablets.  5-6 hard candies.  1 small box raisins.   cup regular soda pop.  Practice good hygiene, to avoid infections.  Do not smoke. SEEK MEDICAL CARE IF:   You develop abnormal vaginal discharge, with or without itching.  You become weak and tired more than expected.  You seem to sweat a lot.  You have a sudden increase in weight, 5 pounds or more in one week.  You are losing weight, 3 pounds or more in a week.  Your blood glucose level is high, and you need instructions on what to do about it. SEEK IMMEDIATE MEDICAL CARE IF:   You develop a severe headache.  You faint or pass out.  You develop nausea and vomiting.  You become disoriented or confused.  You have a convulsion.  You develop vision problems.  You develop stomach pain.  You develop vaginal bleeding.  You develop uterine contractions.  You have leaking or a gush of fluid from the vagina. AFTER YOU HAVE THE BABY:  Go to all of your follow-up appointments, and have blood tests as advised by your caregiver.  Maintain a healthy lifestyle, to prevent diabetes in the future. This includes:  Following a healthy meal plan.  Controlling your weight.  Getting enough exercise and proper rest.  Do not smoke.  Breastfeed your baby if you can. This will lower the chance of you and your baby developing diabetes later in life. For more information about diabetes, go to the American Diabetes Association at:  www.americandiabetesassociation.org. For more information about gestational diabetes, go to the American Congress of Obstetricians and Gynecologists at: www.acog.org. Document Released: 04/22/2000 Document Revised: 04/08/2011 Document Reviewed: 11/14/2008 ExitCare Patient Information 2013 [employer].  

## 2012-01-20 NOTE — Progress Notes (Signed)
B pos, ABS pos: redraw for AB identification. No hx blood transfusion.  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 F: 78-90, PP breakfast: 90-120; lunch:160-130-137-121-140;dinner: 140-145-120-137-145. Increase am dose to 5 mg.See Seward Grater

## 2012-01-29 NOTE — L&D Delivery Note (Signed)
Operative Delivery Note At 6:57 AM a viable female was delivered via Vaginal, Spontaneous Delivery.  Presentation: vertex; Position: Left, Occiput, Anterior  Delivery of the head: 03/19/2012  6:56 AM First maneuver: 03/19/2012  6:56 AM, Suprapubic Pressure Second maneuver: 03/19/2012  6:56 AM, McRoberts  Woods screw maneuver attempted with slight rotation of infant in counterclockwise direction, but delivery accomplished when nurse repositioned to opposite side of pt and reapplied suprapubic pressure    Verbal consent: obtained from patient.  APGAR: 6, 7; weight pending Placenta status: Intact, Spontaneous.   Cord: 3 vessels with the following complications: None.   Cord clamped and cut immediately following delivery and infant taken to warmer for further evaluation  Anesthesia: Epidural  Episiotomy: None Lacerations: 2nd degree Suture Repair: 3.0 vicryl rapide Est. Blood Loss (mL): 400  Mom to postpartum.  Baby stable, moving both arms normally, rooming in with parents.  [name] 03/19/2012, 7:33 AM  I was present for this delivery in which student midwife pushed with pt and assisted delivery of infant's head, then dystocia was determined and I performed maneuvers for shoulder dystocia and delivery of infant.  Dr Emelda Fear called to room and arrived in less than one minute, and was present for delivery of infant, cord clamping, and early resuscitation of infant. Infant stable and placed in father's arms within 10 minutes after birth, as repair was performed by student midwife and myself.  Sharen Counter Certified Nurse-Midwife

## 2012-02-04 ENCOUNTER — Encounter: Payer: Self-pay | Admitting: Obstetrics & Gynecology

## 2012-02-04 ENCOUNTER — Ambulatory Visit (INDEPENDENT_AMBULATORY_CARE_PROVIDER_SITE_OTHER): Payer: Medicaid Other | Admitting: Obstetrics & Gynecology

## 2012-02-04 VITALS — BP 111/74 | Temp 96.8°F | Wt 167.5 lb

## 2012-02-04 DIAGNOSIS — O24419 Gestational diabetes mellitus in pregnancy, unspecified control: Secondary | ICD-10-CM

## 2012-02-04 DIAGNOSIS — O99810 Abnormal glucose complicating pregnancy: Secondary | ICD-10-CM

## 2012-02-04 DIAGNOSIS — O0990 Supervision of high risk pregnancy, unspecified, unspecified trimester: Secondary | ICD-10-CM

## 2012-02-04 LAB — POCT URINALYSIS DIP (DEVICE)
Bilirubin Urine: NEGATIVE
Hgb urine dipstick: NEGATIVE
Nitrite: NEGATIVE
pH: 7 (ref 5.0–8.0)

## 2012-02-04 MED ORDER — GLUCOSE BLOOD VI STRP: ORAL_STRIP | Status: DC

## 2012-02-04 MED ORDER — ACCU-CHEK SOFTCLIX LANCETS MISC: Status: DC

## 2012-02-04 MED ORDER — GLYBURIDE 5 MG PO TABS: 5.0000 mg | ORAL_TABLET | Freq: Two times a day (BID) | ORAL | Status: DC

## 2012-02-04 NOTE — Progress Notes (Addendum)
Fetal echo scheduled with Dr. Elizebeth Brooking on 02/20/12 @ 0920.  Medicaid authorization completed.

## 2012-02-04 NOTE — Progress Notes (Signed)
Pulse: 93

## 2012-02-04 NOTE — Addendum Note (Signed)
Addended by: Faythe Casa on: 02/04/2012 12:00 PM   Modules accepted: Orders

## 2012-02-04 NOTE — Patient Instructions (Signed)
Return to clinic for any obstetric concerns or go to MAU for evaluation  

## 2012-02-04 NOTE — Progress Notes (Signed)
[place] interpreter present for encounter.  CBGs fastings 58-93; postprandials B 100-135, L 110-135, D 125-150s.  Increased Glyburide to 5mg  po bid. Will reevaluate CBGs in one week; will also start twice a week testing.  Patient informed.  No other complaints or concerns.  Fetal movement and labor precautions reviewed.  Fetal echo results not seen, patient did not go for scheduled appointment.  This was rescheduled, will follow up report.

## 2012-02-10 ENCOUNTER — Ambulatory Visit (INDEPENDENT_AMBULATORY_CARE_PROVIDER_SITE_OTHER): Payer: Medicaid Other | Admitting: Obstetrics & Gynecology

## 2012-02-10 ENCOUNTER — Encounter: Payer: Self-pay | Admitting: Obstetrics & Gynecology

## 2012-02-10 ENCOUNTER — Encounter (HOSPITAL_COMMUNITY): Payer: Self-pay | Admitting: *Deleted

## 2012-02-10 ENCOUNTER — Inpatient Hospital Stay (HOSPITAL_COMMUNITY): Payer: Medicaid Other

## 2012-02-10 ENCOUNTER — Inpatient Hospital Stay (HOSPITAL_COMMUNITY)
Admission: AD | Admit: 2012-02-10 | Discharge: 2012-02-10 | Disposition: A | Discharge status: Home / Self Care | Payer: Medicaid Other | Source: Intra-hospital | Attending: Obstetrics & Gynecology | Admitting: Obstetrics & Gynecology

## 2012-02-10 VITALS — BP 111/74 | Wt 168.5 lb

## 2012-02-10 DIAGNOSIS — Z3689 Encounter for other specified antenatal screening: Secondary | ICD-10-CM

## 2012-02-10 DIAGNOSIS — O99810 Abnormal glucose complicating pregnancy: Secondary | ICD-10-CM | POA: Insufficient documentation

## 2012-02-10 DIAGNOSIS — O24419 Gestational diabetes mellitus in pregnancy, unspecified control: Secondary | ICD-10-CM

## 2012-02-10 LAB — POCT URINALYSIS DIP (DEVICE)
Hgb urine dipstick: NEGATIVE
Ketones, ur: NEGATIVE mg/dL
Protein, ur: NEGATIVE mg/dL
Specific Gravity, Urine: <=1.005 (ref 1.005–1.030)
pH: 6.5 (ref 5.0–8.0)

## 2012-02-10 NOTE — MAU Provider Note (Signed)
Chief Complaint:  No chief complaint on file.   First Provider Initiated Contact with Patient 02/10/12 1153      HPI: Veronica Love is a 27 y.o. G3P1011 at [redacted]w[redacted]d who presents to maternity admissions from clinic following a non-reactive NST. The patient had a tracing showing minimal variability, but no decelerations. She reports no pain, discomfort, contractions here today. She reports good fetal movement.     Past Medical History: Past Medical History  Diagnosis Date  . No pertinent past medical history     Past obstetric history: OB History    Grav Para Term Preterm Abortions TAB SAB Ect Mult Living   3 1 1  1  1   1      # Outc Date GA Lbr Len/2nd Wgt Sex Del Anes PTL Lv   1 TRM 7/10 [redacted]w[redacted]d 24:00 7lb14oz(3.572kg) F SVD EPI  Yes   2 SAB 3/13 [redacted]w[redacted]d          3 CUR               Past Surgical History: Past Surgical History  Procedure Date  . No past surgeries     Family History: History reviewed. No pertinent family history.  Social History: History  Substance Use Topics  . Smoking status: Never Smoker   . Smokeless tobacco: Never Used  . Alcohol Use: No    Allergies: No Known Allergies  Meds:  Prescriptions prior to admission  Medication Sig Dispense Refill  . glyBURIDE (DIABETA) 5 MG tablet Take 1 tablet (5 mg total) by mouth 2 (two) times daily with a meal.  60 tablet  5  . Prenatal Vit-Fe Fumarate-FA (MULTIVITAMIN-PRENATAL) 27-0.8 MG TABS Take 1 tablet by mouth daily.      Marland Kitchen ACCU-CHEK SOFTCLIX LANCETS lancets Use as instructed  100 each  12  . glucose blood (ACCU-CHEK SMARTVIEW) test strip Use as instructed  100 each  12    ROS: Pertinent findings in history of present illness.  Physical Exam  Blood pressure 124/79, pulse 118, temperature 97.6 F (36.4 C), temperature source Oral, resp. rate 18, last menstrual period 06/12/2011. GENERAL: Well-developed, well-nourished female in no acute distress.  HEENT: normocephalic HEART: normal rate and rhythm RESP: normal  effort, clear to auscultation ABDOMEN: Soft, non-tender, gravid appropriate for gestational age EXTREMITIES: Nontender, no edema NEURO: alert and oriented    FHT:  Baseline 155 , moderate variability, accelerations present, no decelerations Contractions: none   Labs: Results for orders placed in visit on 02/10/12 (from the past 24 hour(s))  POCT URINALYSIS DIP (DEVICE)     Status: Abnormal   Collection Time   02/10/12  9:09 AM      Component Value Range   Glucose, UA 100 (*) NEGATIVE mg/dL   Bilirubin Urine NEGATIVE  NEGATIVE   Ketones, ur NEGATIVE  NEGATIVE mg/dL   Specific Gravity, Urine <=1.005  1.005 - 1.030   Hgb urine dipstick NEGATIVE  NEGATIVE   pH 6.5  5.0 - 8.0   Protein, ur NEGATIVE  NEGATIVE mg/dL   Urobilinogen, UA 0.2  0.0 - 1.0 mg/dL   Nitrite NEGATIVE  NEGATIVE   Leukocytes, UA TRACE (*) NEGATIVE    Imaging:    MAU Course: Discussed patient with Dr. Macon Large. Reactive tracing here in MAU. BPP today.  Assessment: 1. Gestational diabetes mellitus, currently pregnant   2. NST (non-stress test) reactive     Plan: Discharge home Patient encouraged to keep next appointment in the clinic Patient may return  to MAU as needed.       Follow-up Information    Follow up with [facility]. (As scheduled)    Contact information:   [address] [place] [address]           Medication List     As of 02/10/2012  1:13 PM    TAKE these medications         ACCU-CHEK SOFTCLIX LANCETS lancets   Use as instructed      glucose blood test strip   Use as instructed      glyBURIDE 5 MG tablet   Commonly known as: DIABETA   Take 1 tablet (5 mg total) by mouth 2 (two) times daily with a meal.      multivitamin-prenatal 27-0.8 MG Tabs   Take 1 tablet by mouth daily.         Freddi Starr, PA-C 02/10/2012 1:13 PM

## 2012-02-10 NOTE — Patient Instructions (Signed)
Fetal Movement Counts Patient Name: __________________________________________________ Patient Due Date: ____________________ Kick counts is highly recommended in high risk pregnancies, but it is a good idea for every pregnant woman to do. Start counting fetal movements at 28 weeks of the pregnancy. Fetal movements increase after eating a full meal or eating or drinking something sweet (the blood sugar is higher). It is also important to drink plenty of fluids (well hydrated) before doing the count. Lie on your left side because it helps with the circulation or you can sit in a comfortable chair with your arms over your belly (abdomen) with no distractions around you. DOING THE COUNT  Try to do the count the same time of day each time you do it.  Mark the day and time, then see how long it takes for you to feel 10 movements (kicks, flutters, swishes, rolls). You should have at least 10 movements within 2 hours. You will most likely feel 10 movements in much less than 2 hours. If you do not, wait an hour and count again. After a couple of days you will see a pattern.  What you are looking for is a change in the pattern or not enough counts in 2 hours. Is it taking longer in time to reach 10 movements? SEEK MEDICAL CARE IF:  You feel less than 10 counts in 2 hours. Tried twice.  No movement in one hour.  The pattern is changing or taking longer each day to reach 10 counts in 2 hours.  You feel the baby is not moving as it usually does. Date: ____________ Movements: ____________ Start time: ____________ Finish time: ____________  Date: ____________ Movements: ____________ Start time: ____________ Finish time: ____________ Date: ____________ Movements: ____________ Start time: ____________ Finish time: ____________ Date: ____________ Movements: ____________ Start time: ____________ Finish time: ____________ Date: ____________ Movements: ____________ Start time: ____________ Finish time:  ____________ Date: ____________ Movements: ____________ Start time: ____________ Finish time: ____________ Date: ____________ Movements: ____________ Start time: ____________ Finish time: ____________ Date: ____________ Movements: ____________ Start time: ____________ Finish time: ____________  Date: ____________ Movements: ____________ Start time: ____________ Finish time: ____________ Date: ____________ Movements: ____________ Start time: ____________ Finish time: ____________ Date: ____________ Movements: ____________ Start time: ____________ Finish time: ____________ Date: ____________ Movements: ____________ Start time: ____________ Finish time: ____________ Date: ____________ Movements: ____________ Start time: ____________ Finish time: ____________ Date: ____________ Movements: ____________ Start time: ____________ Finish time: ____________ Date: ____________ Movements: ____________ Start time: ____________ Finish time: ____________  Date: ____________ Movements: ____________ Start time: ____________ Finish time: ____________ Date: ____________ Movements: ____________ Start time: ____________ Finish time: ____________ Date: ____________ Movements: ____________ Start time: ____________ Finish time: ____________ Date: ____________ Movements: ____________ Start time: ____________ Finish time: ____________ Date: ____________ Movements: ____________ Start time: ____________ Finish time: ____________ Date: ____________ Movements: ____________ Start time: ____________ Finish time: ____________ Date: ____________ Movements: ____________ Start time: ____________ Finish time: ____________  Date: ____________ Movements: ____________ Start time: ____________ Finish time: ____________ Date: ____________ Movements: ____________ Start time: ____________ Finish time: ____________ Date: ____________ Movements: ____________ Start time: ____________ Finish time: ____________ Date: ____________ Movements:  ____________ Start time: ____________ Finish time: ____________ Date: ____________ Movements: ____________ Start time: ____________ Finish time: ____________ Date: ____________ Movements: ____________ Start time: ____________ Finish time: ____________ Date: ____________ Movements: ____________ Start time: ____________ Finish time: ____________  Date: ____________ Movements: ____________ Start time: ____________ Finish time: ____________ Date: ____________ Movements: ____________ Start time: ____________ Finish time: ____________ Date: ____________ Movements: ____________ Start time: ____________ Finish time: ____________ Date: ____________ Movements:   ____________ Start time: ____________ Finish time: ____________ Date: ____________ Movements: ____________ Start time: ____________ Finish time: ____________ Date: ____________ Movements: ____________ Start time: ____________ Finish time: ____________ Date: ____________ Movements: ____________ Start time: ____________ Finish time: ____________  Date: ____________ Movements: ____________ Start time: ____________ Finish time: ____________ Date: ____________ Movements: ____________ Start time: ____________ Finish time: ____________ Date: ____________ Movements: ____________ Start time: ____________ Finish time: ____________ Date: ____________ Movements: ____________ Start time: ____________ Finish time: ____________ Date: ____________ Movements: ____________ Start time: ____________ Finish time: ____________ Date: ____________ Movements: ____________ Start time: ____________ Finish time: ____________ Date: ____________ Movements: ____________ Start time: ____________ Finish time: ____________  Date: ____________ Movements: ____________ Start time: ____________ Finish time: ____________ Date: ____________ Movements: ____________ Start time: ____________ Finish time: ____________ Date: ____________ Movements: ____________ Start time: ____________ Finish  time: ____________ Date: ____________ Movements: ____________ Start time: ____________ Finish time: ____________ Date: ____________ Movements: ____________ Start time: ____________ Finish time: ____________ Date: ____________ Movements: ____________ Start time: ____________ Finish time: ____________ Date: ____________ Movements: ____________ Start time: ____________ Finish time: ____________  Date: ____________ Movements: ____________ Start time: ____________ Finish time: ____________ Date: ____________ Movements: ____________ Start time: ____________ Finish time: ____________ Date: ____________ Movements: ____________ Start time: ____________ Finish time: ____________ Date: ____________ Movements: ____________ Start time: ____________ Finish time: ____________ Date: ____________ Movements: ____________ Start time: ____________ Finish time: ____________ Date: ____________ Movements: ____________ Start time: ____________ Finish time: ____________ Document Released: 02/13/2006 Document Revised: 04/08/2011 Document Reviewed: 08/16/2008 ExitCare Patient Information 2013 [employer].  

## 2012-02-10 NOTE — MAU Provider Note (Signed)
Attestation of Attending Supervision of Advanced Practitioner (PA/CNM/NP): Evaluation and management procedures were performed by the Advanced Practitioner under my supervision and collaboration.  I have reviewed the Advanced Practitioner's note and chart, and I agree with the management and plan. BPP 10/10, patient sent home with labor and fetal movement precautions.  Jaynie Collins, MD, FACOG Attending Obstetrician & Gynecologist Faculty Practice, [facility]

## 2012-02-10 NOTE — Progress Notes (Signed)
P-109 

## 2012-02-10 NOTE — MAU Note (Signed)
Pt sent up from clinic for non-reactive NST.  Reports fetal movement now.  Denies any pain or bleeding or lof.

## 2012-02-10 NOTE — Progress Notes (Deleted)
NST tracing evaluated by Dr. Penne Lash- pt sent to MAU for prolonged EFM and BPP.

## 2012-02-10 NOTE — Progress Notes (Signed)
NST shows minimal variability.  There are a few 10 x 10 accelerations.  Need to send to MAU for prolonged monitoring and a BPP.  Fastings:53-92; 2 hr pp 100-139 (1 abml value); 110-150 (most in 130s); 22 pp dinner 96-130 most nml. Continue glyburide.  To MAU.

## 2012-02-13 ENCOUNTER — Ambulatory Visit (INDEPENDENT_AMBULATORY_CARE_PROVIDER_SITE_OTHER): Payer: Medicaid Other | Admitting: *Deleted

## 2012-02-13 VITALS — BP 117/69 | Wt 172.0 lb

## 2012-02-13 DIAGNOSIS — O99810 Abnormal glucose complicating pregnancy: Secondary | ICD-10-CM

## 2012-02-13 DIAGNOSIS — O24419 Gestational diabetes mellitus in pregnancy, unspecified control: Secondary | ICD-10-CM

## 2012-02-13 NOTE — Progress Notes (Signed)
1/16 NST reviewed and reactive

## 2012-02-13 NOTE — Progress Notes (Signed)
P-85 

## 2012-02-17 ENCOUNTER — Ambulatory Visit (INDEPENDENT_AMBULATORY_CARE_PROVIDER_SITE_OTHER): Payer: Medicaid Other | Admitting: Obstetrics & Gynecology

## 2012-02-17 VITALS — BP 106/68 | Temp 96.4°F | Wt 171.2 lb

## 2012-02-17 DIAGNOSIS — O99810 Abnormal glucose complicating pregnancy: Secondary | ICD-10-CM

## 2012-02-17 DIAGNOSIS — O24419 Gestational diabetes mellitus in pregnancy, unspecified control: Secondary | ICD-10-CM

## 2012-02-17 LAB — POCT URINALYSIS DIP (DEVICE)
Bilirubin Urine: NEGATIVE
Glucose, UA: 500 mg/dL — AB
Hgb urine dipstick: NEGATIVE
Ketones, ur: NEGATIVE mg/dL
Specific Gravity, Urine: 1.01 (ref 1.005–1.030)

## 2012-02-17 LAB — GLUCOSE, CAPILLARY: Glucose-Capillary: 187 mg/dL — ABNORMAL HIGH (ref 70–99)

## 2012-02-17 LAB — HEMOGLOBIN A1C
Hgb A1c MFr Bld: 6.7 % — ABNORMAL HIGH (ref <5.7)
Mean Plasma Glucose: 146 mg/dL — ABNORMAL HIGH (ref <117)

## 2012-02-17 NOTE — Progress Notes (Signed)
Urine glucose 500 today.  CBGs all within normal limits as recorded in her log book  Patient reported having sugary drink and rice noodles this morning.  Emphasized importance of sticking to prescribed diet.  Random CBG in clinic 187. Will check HgbA1C. Advised to bring log book and meter next visit.  NST performed today was reviewed and was found to be reactive.  Continue recommended antenatal testing and prenatal care. Tocometer showed q3-4 minutes contractions, cervix checked and was closed/thick/posterior. Patient reassured, fetal movement and labor precautions reviewed.

## 2012-02-17 NOTE — Progress Notes (Signed)
P = 87 

## 2012-02-17 NOTE — Patient Instructions (Signed)
Return to clinic for any obstetric concerns or go to MAU for evaluation  

## 2012-02-20 ENCOUNTER — Ambulatory Visit (INDEPENDENT_AMBULATORY_CARE_PROVIDER_SITE_OTHER): Payer: Medicaid Other | Admitting: Obstetrics and Gynecology

## 2012-02-20 VITALS — BP 117/69 | Wt 173.7 lb

## 2012-02-20 DIAGNOSIS — O99810 Abnormal glucose complicating pregnancy: Secondary | ICD-10-CM

## 2012-02-20 NOTE — Progress Notes (Signed)
NST reviewed and reactive. Patient aware of contractions on monitor Cx: FT/thick/ posterior/-3. FM/PTL precautions reviewed

## 2012-02-20 NOTE — Progress Notes (Signed)
P = 79    Dr. Jolayne Panther informed of NST results and frequency of UC's.  Cx exam per Dr. Jolayne Panther

## 2012-02-24 ENCOUNTER — Encounter: Payer: Medicaid Other | Attending: Obstetrics & Gynecology | Admitting: Dietician

## 2012-02-24 ENCOUNTER — Ambulatory Visit (INDEPENDENT_AMBULATORY_CARE_PROVIDER_SITE_OTHER): Payer: Medicaid Other | Admitting: Obstetrics & Gynecology

## 2012-02-24 VITALS — BP 120/70 | Temp 97.1°F | Wt 175.0 lb

## 2012-02-24 DIAGNOSIS — O99810 Abnormal glucose complicating pregnancy: Secondary | ICD-10-CM

## 2012-02-24 DIAGNOSIS — Z713 Dietary counseling and surveillance: Secondary | ICD-10-CM | POA: Insufficient documentation

## 2012-02-24 DIAGNOSIS — O24419 Gestational diabetes mellitus in pregnancy, unspecified control: Secondary | ICD-10-CM

## 2012-02-24 LAB — POCT URINALYSIS DIP (DEVICE)
Bilirubin Urine: NEGATIVE
Glucose, UA: 500 mg/dL — AB
Leukocytes, UA: NEGATIVE
Nitrite: NEGATIVE
pH: 7 (ref 5.0–8.0)

## 2012-02-24 NOTE — Progress Notes (Signed)
Diabetes Education:  Review of glucose meter reveals a number of entries in her glucose log that are not in her meter.  With the assistance of the interpreter, determined that she "is using two meters" and only brought one to clinic.  Even with the one meter, there were a number of elevated numbers in the 150-220 range that were not entered into her log book.  Ask that she use only one meter and at her next clinic visit, to bring both meters.  Cautioned her that she needs to be limiting the starch servings, especially the rice.  Reviewed serving sizes.  Maggie May. RN, RD, CDE

## 2012-02-24 NOTE — Patient Instructions (Signed)
Return to clinic for any obstetric concerns or go to MAU for evaluation  

## 2012-02-24 NOTE — Progress Notes (Signed)
02/17/12 Hgb A1C 6.7.  Repeat urine glucose today was 500 again. Reviewed meter CBG log with Maggie May, RN, CDE; her numbers on the meter do not match up with her log book, several numbers are missing, and she did not record high values such as 251 which was obtained this morning (she wrote 87).  She had a long conversation with Maggie with the help of her interpreter, emphasized the importance of accurate reporting to make sure she is appropriately treated as incorrect treatment this could result in increased risk of maternal of fetal morbidity.  She will get her NST today, continue twice weekly testing.  NST performed today was reviewed and was found to be reactive.  Continue recommended antenatal testing and prenatal care.  Fetal movement and labor precautions reviewed.

## 2012-02-24 NOTE — Progress Notes (Signed)
Pulse-  95 

## 2012-02-27 ENCOUNTER — Ambulatory Visit (INDEPENDENT_AMBULATORY_CARE_PROVIDER_SITE_OTHER): Payer: Medicaid Other | Admitting: *Deleted

## 2012-02-27 VITALS — BP 112/73 | Wt 176.3 lb

## 2012-02-27 DIAGNOSIS — O99810 Abnormal glucose complicating pregnancy: Secondary | ICD-10-CM

## 2012-02-27 NOTE — Progress Notes (Signed)
P-90 

## 2012-03-02 ENCOUNTER — Encounter: Payer: Self-pay | Admitting: Obstetrics and Gynecology

## 2012-03-02 ENCOUNTER — Ambulatory Visit (INDEPENDENT_AMBULATORY_CARE_PROVIDER_SITE_OTHER): Payer: Medicaid Other | Admitting: Obstetrics and Gynecology

## 2012-03-02 VITALS — BP 113/70 | Wt 177.0 lb

## 2012-03-02 DIAGNOSIS — B951 Streptococcus, group B, as the cause of diseases classified elsewhere: Secondary | ICD-10-CM

## 2012-03-02 DIAGNOSIS — O2390 Unspecified genitourinary tract infection in pregnancy, unspecified trimester: Secondary | ICD-10-CM

## 2012-03-02 DIAGNOSIS — N390 Urinary tract infection, site not specified: Secondary | ICD-10-CM

## 2012-03-02 DIAGNOSIS — O99810 Abnormal glucose complicating pregnancy: Secondary | ICD-10-CM

## 2012-03-02 DIAGNOSIS — O2340 Unspecified infection of urinary tract in pregnancy, unspecified trimester: Secondary | ICD-10-CM

## 2012-03-02 DIAGNOSIS — O24419 Gestational diabetes mellitus in pregnancy, unspecified control: Secondary | ICD-10-CM

## 2012-03-02 LAB — POCT URINALYSIS DIP (DEVICE)
Glucose, UA: NEGATIVE mg/dL
Hgb urine dipstick: NEGATIVE
Leukocytes, UA: NEGATIVE
Nitrite: NEGATIVE
Urobilinogen, UA: 0.2 mg/dL (ref 0.0–1.0)

## 2012-03-02 NOTE — Progress Notes (Signed)
NST reviewed and reactive. Patient did not bring meter today, just log book. CBG f 62-86 2hr pp 57-237 (5/11 abnormal). Patient not always recording CBGs. Reviewed importance of adhering to diet and recording CBG's. FM/PTL precautions reviewed. Patient declined exam today. F/U ultrasound on 2/6

## 2012-03-02 NOTE — Progress Notes (Signed)
P=80     , c/o edema in feet only, Used Interpreter Snow,c/o pelvic pressure x 2 days,

## 2012-03-03 LAB — GC/CHLAMYDIA PROBE AMP, URINE
Chlamydia, Swab/Urine, PCR: NEGATIVE
GC Probe Amp, Urine: NEGATIVE

## 2012-03-05 ENCOUNTER — Ambulatory Visit (INDEPENDENT_AMBULATORY_CARE_PROVIDER_SITE_OTHER): Payer: Medicaid Other | Admitting: General Practice

## 2012-03-05 ENCOUNTER — Ambulatory Visit (HOSPITAL_COMMUNITY)
Admission: RE | Admit: 2012-03-05 | Discharge: 2012-03-05 | Disposition: A | Discharge status: Home / Self Care | Payer: Medicaid Other | Source: Ambulatory Visit | Attending: Obstetrics & Gynecology | Admitting: Obstetrics & Gynecology

## 2012-03-05 VITALS — BP 125/74 | Wt 178.1 lb

## 2012-03-05 DIAGNOSIS — Z3689 Encounter for other specified antenatal screening: Secondary | ICD-10-CM | POA: Insufficient documentation

## 2012-03-05 DIAGNOSIS — O99810 Abnormal glucose complicating pregnancy: Secondary | ICD-10-CM | POA: Insufficient documentation

## 2012-03-05 NOTE — Progress Notes (Signed)
Pulse- 84 Patient states she sometimes feels contractions but they aren't painful

## 2012-03-05 NOTE — Progress Notes (Signed)
NST 03/05/12 reactive 

## 2012-03-09 ENCOUNTER — Ambulatory Visit (INDEPENDENT_AMBULATORY_CARE_PROVIDER_SITE_OTHER): Payer: Medicaid Other | Admitting: Obstetrics & Gynecology

## 2012-03-09 ENCOUNTER — Encounter: Payer: Self-pay | Admitting: *Deleted

## 2012-03-09 VITALS — BP 116/77 | Temp 97.9°F | Wt 181.7 lb

## 2012-03-09 DIAGNOSIS — O99810 Abnormal glucose complicating pregnancy: Secondary | ICD-10-CM

## 2012-03-09 DIAGNOSIS — O24419 Gestational diabetes mellitus in pregnancy, unspecified control: Secondary | ICD-10-CM

## 2012-03-09 LAB — POCT URINALYSIS DIP (DEVICE)
Bilirubin Urine: NEGATIVE
Nitrite: NEGATIVE
pH: 6.5 (ref 5.0–8.0)

## 2012-03-09 NOTE — Progress Notes (Signed)
Pulse: 81

## 2012-03-09 NOTE — Progress Notes (Signed)
FBS 56-88. PP 64-160, usually 120-140. Will not change meds. [place] next week for growth

## 2012-03-09 NOTE — Progress Notes (Signed)
NST today reactive, few contractions. Needs growth [place] 38 weeks

## 2012-03-09 NOTE — Progress Notes (Signed)
U/S scheduled 03/16/12 at 1145 am.

## 2012-03-12 ENCOUNTER — Other Ambulatory Visit: Payer: Medicaid Other

## 2012-03-16 ENCOUNTER — Ambulatory Visit (HOSPITAL_COMMUNITY)
Admission: RE | Admit: 2012-03-16 | Discharge: 2012-03-16 | Disposition: A | Discharge status: Home / Self Care | Payer: Medicaid Other | Source: Ambulatory Visit | Attending: Obstetrics & Gynecology | Admitting: Obstetrics & Gynecology

## 2012-03-16 ENCOUNTER — Other Ambulatory Visit: Payer: Self-pay | Admitting: Obstetrics and Gynecology

## 2012-03-16 ENCOUNTER — Encounter: Payer: Self-pay | Admitting: Obstetrics and Gynecology

## 2012-03-16 ENCOUNTER — Ambulatory Visit (INDEPENDENT_AMBULATORY_CARE_PROVIDER_SITE_OTHER): Payer: Medicaid Other | Admitting: Obstetrics and Gynecology

## 2012-03-16 VITALS — BP 128/82 | Temp 98.0°F | Wt 187.8 lb

## 2012-03-16 DIAGNOSIS — B951 Streptococcus, group B, as the cause of diseases classified elsewhere: Secondary | ICD-10-CM

## 2012-03-16 DIAGNOSIS — Z3689 Encounter for other specified antenatal screening: Secondary | ICD-10-CM | POA: Insufficient documentation

## 2012-03-16 DIAGNOSIS — O2340 Unspecified infection of urinary tract in pregnancy, unspecified trimester: Secondary | ICD-10-CM

## 2012-03-16 DIAGNOSIS — O24419 Gestational diabetes mellitus in pregnancy, unspecified control: Secondary | ICD-10-CM

## 2012-03-16 DIAGNOSIS — O99810 Abnormal glucose complicating pregnancy: Secondary | ICD-10-CM | POA: Insufficient documentation

## 2012-03-16 DIAGNOSIS — N390 Urinary tract infection, site not specified: Secondary | ICD-10-CM

## 2012-03-16 DIAGNOSIS — O0990 Supervision of high risk pregnancy, unspecified, unspecified trimester: Secondary | ICD-10-CM

## 2012-03-16 DIAGNOSIS — O2390 Unspecified genitourinary tract infection in pregnancy, unspecified trimester: Secondary | ICD-10-CM

## 2012-03-16 NOTE — Progress Notes (Signed)
Pulse: 79

## 2012-03-16 NOTE — Progress Notes (Signed)
NSt reviewed and reactive. CBG- majority within range. A few post lunch and dinner in 150 range. Patient admits to not adhering to diet. Diabetic education provided. FM/labor precautions reviewed. F/U growth ultrasound today. Will schedule induction of labor on 2/26 at 39 weeks.

## 2012-03-16 NOTE — Progress Notes (Signed)
[place] growth today

## 2012-03-17 LAB — POCT URINALYSIS DIP (DEVICE)
Ketones, ur: NEGATIVE mg/dL
Protein, ur: NEGATIVE mg/dL
Urobilinogen, UA: 0.2 mg/dL (ref 0.0–1.0)

## 2012-03-18 ENCOUNTER — Inpatient Hospital Stay (HOSPITAL_COMMUNITY)
Admission: AD | Admit: 2012-03-18 | Discharge: 2012-03-21 | DRG: 774 | Disposition: A | Discharge status: Home / Self Care | Payer: Medicaid Other | Source: Ambulatory Visit | Attending: Obstetrics & Gynecology | Admitting: Obstetrics & Gynecology

## 2012-03-18 ENCOUNTER — Telehealth (HOSPITAL_COMMUNITY): Payer: Self-pay | Admitting: *Deleted

## 2012-03-18 ENCOUNTER — Encounter (HOSPITAL_COMMUNITY): Payer: Self-pay | Admitting: *Deleted

## 2012-03-18 DIAGNOSIS — E119 Type 2 diabetes mellitus without complications: Secondary | ICD-10-CM | POA: Diagnosis present

## 2012-03-18 DIAGNOSIS — IMO0001 Reserved for inherently not codable concepts without codable children: Secondary | ICD-10-CM

## 2012-03-18 DIAGNOSIS — O864 Pyrexia of unknown origin following delivery: Secondary | ICD-10-CM | POA: Diagnosis not present

## 2012-03-18 DIAGNOSIS — Z22330 Carrier of Group B streptococcus: Secondary | ICD-10-CM

## 2012-03-18 DIAGNOSIS — IMO0002 Reserved for concepts with insufficient information to code with codable children: Secondary | ICD-10-CM | POA: Diagnosis not present

## 2012-03-18 DIAGNOSIS — O3660X0 Maternal care for excessive fetal growth, unspecified trimester, not applicable or unspecified: Secondary | ICD-10-CM | POA: Diagnosis present

## 2012-03-18 DIAGNOSIS — O2432 Unspecified pre-existing diabetes mellitus in childbirth: Secondary | ICD-10-CM | POA: Diagnosis present

## 2012-03-18 DIAGNOSIS — O99892 Other specified diseases and conditions complicating childbirth: Secondary | ICD-10-CM | POA: Diagnosis present

## 2012-03-18 HISTORY — DX: Gestational diabetes mellitus in pregnancy, unspecified control: O24.419

## 2012-03-18 LAB — PREPARE RBC (CROSSMATCH)

## 2012-03-18 LAB — CBC
HCT: 37.5 % (ref 36.0–46.0)
MCHC: 32.3 g/dL (ref 30.0–36.0)
MCV: 73.8 fL — ABNORMAL LOW (ref 78.0–100.0)
Platelets: 146 10*3/uL — ABNORMAL LOW (ref 150–400)
RDW: 15.4 % (ref 11.5–15.5)
WBC: 7.8 10*3/uL (ref 4.0–10.5)

## 2012-03-18 LAB — GLUCOSE, CAPILLARY: Glucose-Capillary: 105 mg/dL — ABNORMAL HIGH (ref 70–99)

## 2012-03-18 MED ORDER — ONDANSETRON HCL 4 MG/2ML IJ SOLN: 4.0000 mg | Freq: Four times a day (QID) | INTRAMUSCULAR | Status: DC | PRN

## 2012-03-18 MED ORDER — ACETAMINOPHEN 325 MG PO TABS: 650.0000 mg | ORAL_TABLET | ORAL | Status: DC | PRN

## 2012-03-18 MED ORDER — PENICILLIN G POTASSIUM 5000000 UNITS IJ SOLR
5.0000 10*6.[IU] | Freq: Once | INTRAVENOUS | Status: AC
  Administered 2012-03-18: 5 10*6.[IU] via INTRAVENOUS
  Filled 2012-03-18: qty 5

## 2012-03-18 MED ORDER — LACTATED RINGERS IV SOLN
500.0000 mL | INTRAVENOUS | Status: DC | PRN
  Administered 2012-03-19: 500 mL via INTRAVENOUS

## 2012-03-18 MED ORDER — IBUPROFEN 600 MG PO TABS: 600.0000 mg | ORAL_TABLET | Freq: Four times a day (QID) | ORAL | Status: DC | PRN

## 2012-03-18 MED ORDER — LIDOCAINE HCL (PF) 1 % IJ SOLN
30.0000 mL | INTRAMUSCULAR | Status: DC | PRN
  Filled 2012-03-18: qty 30

## 2012-03-18 MED ORDER — LACTATED RINGERS IV SOLN
500.0000 mL | Freq: Once | INTRAVENOUS | Status: AC
  Administered 2012-03-19: 500 mL via INTRAVENOUS

## 2012-03-18 MED ORDER — FLEET ENEMA 7-19 GM/118ML RE ENEM: 1.0000 | ENEMA | RECTAL | Status: DC | PRN

## 2012-03-18 MED ORDER — PENICILLIN G POTASSIUM 5000000 UNITS IJ SOLR
2.5000 10*6.[IU] | INTRAVENOUS | Status: DC
  Administered 2012-03-19: 2.5 10*6.[IU] via INTRAVENOUS
  Filled 2012-03-18 (×4): qty 2.5

## 2012-03-18 MED ORDER — PHENYLEPHRINE 40 MCG/ML (10ML) SYRINGE FOR IV PUSH (FOR BLOOD PRESSURE SUPPORT): 80.0000 ug | PREFILLED_SYRINGE | INTRAVENOUS | Status: DC | PRN

## 2012-03-18 MED ORDER — OXYCODONE-ACETAMINOPHEN 5-325 MG PO TABS: 1.0000 | ORAL_TABLET | ORAL | Status: DC | PRN

## 2012-03-18 MED ORDER — EPHEDRINE 5 MG/ML INJ: 10.0000 mg | INTRAVENOUS | Status: DC | PRN

## 2012-03-18 MED ORDER — OXYTOCIN 40 UNITS IN LACTATED RINGERS INFUSION - SIMPLE MED
62.5000 mL/h | INTRAVENOUS | Status: DC
  Filled 2012-03-18: qty 1000

## 2012-03-18 MED ORDER — PHENYLEPHRINE 40 MCG/ML (10ML) SYRINGE FOR IV PUSH (FOR BLOOD PRESSURE SUPPORT)
80.0000 ug | PREFILLED_SYRINGE | INTRAVENOUS | Status: DC | PRN
  Filled 2012-03-18: qty 5

## 2012-03-18 MED ORDER — CITRIC ACID-SODIUM CITRATE 334-500 MG/5ML PO SOLN: 30.0000 mL | ORAL | Status: DC | PRN

## 2012-03-18 MED ORDER — LACTATED RINGERS IV SOLN
INTRAVENOUS | Status: DC
  Administered 2012-03-18 – 2012-03-19 (×2): via INTRAVENOUS

## 2012-03-18 MED ORDER — OXYTOCIN BOLUS FROM INFUSION
500.0000 mL | INTRAVENOUS | Status: DC
  Administered 2012-03-19: 500 mL via INTRAVENOUS

## 2012-03-18 MED ORDER — EPHEDRINE 5 MG/ML INJ
10.0000 mg | INTRAVENOUS | Status: DC | PRN
  Filled 2012-03-18: qty 4

## 2012-03-18 MED ORDER — DIPHENHYDRAMINE HCL 50 MG/ML IJ SOLN: 12.5000 mg | INTRAMUSCULAR | Status: DC | PRN

## 2012-03-18 MED ORDER — FENTANYL 2.5 MCG/ML BUPIVACAINE 1/10 % EPIDURAL INFUSION (WH - ANES)
14.0000 mL/h | INTRAMUSCULAR | Status: DC
  Filled 2012-03-18: qty 125

## 2012-03-18 NOTE — MAU Note (Signed)
Pt G3 P1 at 38wks felt a pop and leaking clear fluid since 1900.  Having contractions.

## 2012-03-18 NOTE — H&P (Signed)
Veronica Love is a 27 y.o. female, G3P1011 at [redacted]w[redacted]d who is a patient of [facility] presenting for suspected ROM at 1900 and contractions starting at the same time. Denies vaginal bleeding. Feeling fetal movement. Gestational diabetic on Glyburide 5mg  PO daily, reports BS mostly good with occasional postprandial as high as 200. Patient failed early glucose screening, probable Type 2 DM. GBS bacteriuria in early pregnancy.   Previous delivery was SVD of 6#15oz infant without difficulty. EFW on U/S today 4780g (10#9oz).   Maternal Medical History:  Reason for admission: Rupture of membranes and contractions.  Nausea.  Contractions: Onset was 3-5 hours ago.   Frequency: regular.   Duration is approximately 1 minute.   Perceived severity is moderate.    Fetal activity: Perceived fetal activity is normal.    Prenatal Complications - Diabetes: gestational. Diabetes is managed by oral agent (monotherapy).      OB History   Grav Para Term Preterm Abortions TAB SAB Ect Mult Living   3 1 1  1  1   1      Past Medical History  Diagnosis Date  . No pertinent past medical history   . Gestational diabetes 2013   Past Surgical History  Procedure Laterality Date  . No past surgeries     Family History: family history is not on file. Social History:  reports that she has never smoked. She has never used smokeless tobacco. She reports that she does not drink alcohol or use illicit drugs.   Prenatal Transfer Tool  Maternal Diabetes: Yes:  Diabetes Type:  Insulin/Medication controlled Genetic Screening: Normal Maternal Ultrasounds/Referrals: Normal Fetal Ultrasounds or other Referrals:  None Maternal Substance Abuse:  No Significant Maternal Medications:  Meds include: Other:  Significant Maternal Lab Results:  Lab values include: Group B Strep positive Other Comments:  Glyburide 5mg  daily  Review of Systems  Constitutional: Negative for fever and chills.  Eyes: Negative for blurred vision.   Gastrointestinal: Positive for abdominal pain. Negative for nausea and vomiting.  Neurological: Negative for headaches.    Dilation: 3.5 Effacement (%): 60 Station: -1 Exam by:: Leandro Reasoner, student midwife Blood pressure 153/87, pulse 74, temperature 98 F (36.7 C), temperature source Oral, resp. rate 18, height 5' (1.524 m), weight 86.183 kg (190 lb), last menstrual period 06/12/2011. Maternal Exam:  Uterine Assessment: Contraction strength is mild.  Contraction duration is 1 minute. Contraction frequency is regular.   Abdomen: Patient reports no abdominal tenderness. Fetal presentation: vertex  Introitus: Ferning test: positive.  Amniotic fluid character: clear.  Pelvis: adequate for delivery.   Cervix: Cervix evaluated by digital exam.     Fetal Exam Fetal Monitor Review: Mode: ultrasound.   Baseline rate: 140.  Variability: moderate (6-25 bpm).   Pattern: accelerations present and no decelerations.    Fetal State Assessment: Category I - tracings are normal.     Physical Exam  Constitutional: She appears well-developed and well-nourished.  Cardiovascular: Normal rate, regular rhythm and normal heart sounds.   Respiratory: Effort normal and breath sounds normal.  Genitourinary:  3-4/60/-1 vertex  Musculoskeletal: She exhibits edema.  2+ edema in BLE  Neurological: She displays normal reflexes.    Prenatal labs: ABO, Rh: B/Positive/-- (09/11 0000) Antibody: NEG (12/23 0937) Rubella: Immune (09/11 0000) RPR: NON REAC (12/23 0937)  HBsAg: Negative (09/11 0000)  HIV: NON REACTIVE (12/23 0937)  GBS:  Positive   Assessment/Plan: 1. Active labor with SROM 2. A2GDM, probable pre-gestational Type 2 DM.  3. Macrosomia on U/S  today with EFW 4780g (10#9oz) 4. GBS positive 5.  Elevated intake BP  Admit to L&D Blood glucose q2h Penicillin for GBS prophylaxis  Preeclampsia labs ordered  [name] 03/18/2012, 10:15 PM  I have seen this patient and agree  with the above midwife student's note.  LEFTWICH-KIRBY, LISA Certified Nurse-Midwife

## 2012-03-18 NOTE — Telephone Encounter (Signed)
Preadmission screen interpreter number [phone]

## 2012-03-19 ENCOUNTER — Encounter (HOSPITAL_COMMUNITY): Payer: Self-pay | Admitting: Anesthesiology

## 2012-03-19 ENCOUNTER — Inpatient Hospital Stay (HOSPITAL_COMMUNITY): Payer: Medicaid Other | Admitting: Anesthesiology

## 2012-03-19 ENCOUNTER — Other Ambulatory Visit: Payer: Medicaid Other

## 2012-03-19 DIAGNOSIS — E119 Type 2 diabetes mellitus without complications: Secondary | ICD-10-CM

## 2012-03-19 DIAGNOSIS — O2432 Unspecified pre-existing diabetes mellitus in childbirth: Secondary | ICD-10-CM

## 2012-03-19 LAB — GLUCOSE, CAPILLARY
Glucose-Capillary: 79 mg/dL (ref 70–99)
Glucose-Capillary: 109 mg/dL — ABNORMAL HIGH (ref 70–99)
Glucose-Capillary: 65 mg/dL — ABNORMAL LOW (ref 70–99)
Glucose-Capillary: 69 mg/dL — ABNORMAL LOW (ref 70–99)
Glucose-Capillary: 84 mg/dL (ref 70–99)
Glucose-Capillary: 59 mg/dL — ABNORMAL LOW (ref 70–99)

## 2012-03-19 LAB — URINALYSIS, ROUTINE W REFLEX MICROSCOPIC
Bilirubin Urine: NEGATIVE
Leukocytes, UA: NEGATIVE
Nitrite: NEGATIVE
Specific Gravity, Urine: <1.005 — ABNORMAL LOW (ref 1.005–1.030)
Urobilinogen, UA: 0.2 mg/dL (ref 0.0–1.0)
pH: 6 (ref 5.0–8.0)

## 2012-03-19 LAB — URINE MICROSCOPIC-ADD ON

## 2012-03-19 LAB — CBC
HCT: 35 % — ABNORMAL LOW (ref 36.0–46.0)
Platelets: 138 10*3/uL — ABNORMAL LOW (ref 150–400)
RDW: 15.7 % — ABNORMAL HIGH (ref 11.5–15.5)
WBC: 19.2 10*3/uL — ABNORMAL HIGH (ref 4.0–10.5)

## 2012-03-19 LAB — COMPREHENSIVE METABOLIC PANEL
Albumin: 2.7 g/dL — ABNORMAL LOW (ref 3.5–5.2)
Alkaline Phosphatase: 164 U/L — ABNORMAL HIGH (ref 39–117)
BUN: 6 mg/dL (ref 6–23)
Creatinine, Ser: 0.73 mg/dL (ref 0.50–1.10)
Potassium: 3.8 mEq/L (ref 3.5–5.1)
Total Protein: 6.2 g/dL (ref 6.0–8.3)

## 2012-03-19 LAB — MRSA PCR SCREENING: MRSA by PCR: NEGATIVE

## 2012-03-19 LAB — PROTEIN / CREATININE RATIO, URINE
Creatinine, Urine: 39.12 mg/dL
Total Protein, Urine: 20 mg/dL

## 2012-03-19 MED ORDER — IBUPROFEN 600 MG PO TABS
600.0000 mg | ORAL_TABLET | Freq: Four times a day (QID) | ORAL | Status: DC
  Administered 2012-03-19 – 2012-03-21 (×8): 600 mg via ORAL
  Filled 2012-03-19 (×8): qty 1

## 2012-03-19 MED ORDER — MISOPROSTOL 200 MCG PO TABS
800.0000 ug | ORAL_TABLET | Freq: Once | ORAL | Status: AC
  Administered 2012-03-19: 800 ug via ORAL
  Filled 2012-03-19: qty 4

## 2012-03-19 MED ORDER — ACETAMINOPHEN 500 MG PO TABS
1000.0000 mg | ORAL_TABLET | Freq: Once | ORAL | Status: AC
  Administered 2012-03-19: 1000 mg via ORAL
  Filled 2012-03-19: qty 2

## 2012-03-19 MED ORDER — FENTANYL CITRATE 0.05 MG/ML IJ SOLN: 100.0000 ug | INTRAMUSCULAR | Status: DC | PRN

## 2012-03-19 MED ORDER — TERBUTALINE SULFATE 1 MG/ML IJ SOLN: 0.2500 mg | Freq: Once | INTRAMUSCULAR | Status: DC | PRN

## 2012-03-19 MED ORDER — BENZOCAINE-MENTHOL 20-0.5 % EX AERO
1.0000 "application " | INHALATION_SPRAY | CUTANEOUS | Status: DC | PRN
  Administered 2012-03-19: 1 via TOPICAL
  Filled 2012-03-19: qty 56

## 2012-03-19 MED ORDER — PRENATAL MULTIVITAMIN CH
1.0000 | ORAL_TABLET | Freq: Every day | ORAL | Status: DC
  Administered 2012-03-19 – 2012-03-21 (×3): 1 via ORAL
  Filled 2012-03-19 (×3): qty 1

## 2012-03-19 MED ORDER — TETANUS-DIPHTH-ACELL PERTUSSIS 5-2.5-18.5 LF-MCG/0.5 IM SUSP
0.5000 mL | Freq: Once | INTRAMUSCULAR | Status: AC
  Administered 2012-03-20: 0.5 mL via INTRAMUSCULAR
  Filled 2012-03-19: qty 0.5

## 2012-03-19 MED ORDER — SIMETHICONE 80 MG PO CHEW: 80.0000 mg | CHEWABLE_TABLET | ORAL | Status: DC | PRN

## 2012-03-19 MED ORDER — LACTATED RINGERS IV SOLN
INTRAVENOUS | Status: DC
  Administered 2012-03-19 (×2): via INTRAVENOUS

## 2012-03-19 MED ORDER — MAGNESIUM SULFATE BOLUS VIA INFUSION
4.0000 g | Freq: Once | INTRAVENOUS | Status: AC
  Administered 2012-03-19: 4 g via INTRAVENOUS
  Filled 2012-03-19: qty 500

## 2012-03-19 MED ORDER — DIPHENHYDRAMINE HCL 25 MG PO CAPS: 25.0000 mg | ORAL_CAPSULE | Freq: Four times a day (QID) | ORAL | Status: DC | PRN

## 2012-03-19 MED ORDER — LANOLIN HYDROUS EX OINT: TOPICAL_OINTMENT | CUTANEOUS | Status: DC | PRN

## 2012-03-19 MED ORDER — FENTANYL 2.5 MCG/ML BUPIVACAINE 1/10 % EPIDURAL INFUSION (WH - ANES)
INTRAMUSCULAR | Status: DC | PRN
  Administered 2012-03-19: 12 mL/h via EPIDURAL

## 2012-03-19 MED ORDER — MAGNESIUM SULFATE 40 G IN LACTATED RINGERS - SIMPLE
2.0000 g/h | INTRAVENOUS | Status: AC
  Administered 2012-03-19: 2 g/h via INTRAVENOUS
  Filled 2012-03-19: qty 500

## 2012-03-19 MED ORDER — DIBUCAINE 1 % RE OINT: 1.0000 "application " | TOPICAL_OINTMENT | RECTAL | Status: DC | PRN

## 2012-03-19 MED ORDER — ONDANSETRON HCL 4 MG/2ML IJ SOLN: 4.0000 mg | INTRAMUSCULAR | Status: DC | PRN

## 2012-03-19 MED ORDER — OXYCODONE-ACETAMINOPHEN 5-325 MG PO TABS
1.0000 | ORAL_TABLET | ORAL | Status: DC | PRN
  Administered 2012-03-20: 1 via ORAL
  Filled 2012-03-19: qty 1

## 2012-03-19 MED ORDER — LIDOCAINE HCL (PF) 1 % IJ SOLN
INTRAMUSCULAR | Status: DC | PRN
  Administered 2012-03-19: 4 mL
  Administered 2012-03-19: 3 mL

## 2012-03-19 MED ORDER — OXYTOCIN 40 UNITS IN LACTATED RINGERS INFUSION - SIMPLE MED
1.0000 m[IU]/min | INTRAVENOUS | Status: DC
  Administered 2012-03-19: 2 m[IU]/min via INTRAVENOUS

## 2012-03-19 MED ORDER — WITCH HAZEL-GLYCERIN EX PADS: 1.0000 "application " | MEDICATED_PAD | CUTANEOUS | Status: DC | PRN

## 2012-03-19 MED ORDER — ONDANSETRON HCL 4 MG PO TABS: 4.0000 mg | ORAL_TABLET | ORAL | Status: DC | PRN

## 2012-03-19 MED ORDER — SENNOSIDES-DOCUSATE SODIUM 8.6-50 MG PO TABS
2.0000 | ORAL_TABLET | Freq: Every day | ORAL | Status: DC
  Administered 2012-03-19 – 2012-03-20 (×2): 2 via ORAL

## 2012-03-19 MED ORDER — ZOLPIDEM TARTRATE 5 MG PO TABS: 5.0000 mg | ORAL_TABLET | Freq: Every evening | ORAL | Status: DC | PRN

## 2012-03-19 NOTE — Progress Notes (Signed)
Patient sweating and slightly dizzy upon standing, temp 98.0 oral, CBG checked due to patient being Gestation Diabetic, CBG=59, apple juice given per patient request. Will recheck CBG in 30 minutes.

## 2012-03-19 NOTE — Progress Notes (Signed)
Veronica Love is a 27 y.o. G3P1011 at [redacted]w[redacted]d  admitted for active labor with SROM  Subjective:  Mostly comfortable with epidural Objective: BP 138/69  Pulse 80  Temp(Src) 97.9 F (36.6 C) (Oral)  Resp 18  Ht 5' (1.524 m)  Wt 86.183 kg (190 lb)  BMI 37.11 kg/m2  SpO2 100%  LMP 06/12/2011   Total I/O In: -  Out: 1400 [Urine:1400]  FHT:  FHR: 140 bpm, variability: moderate,  accelerations:  Present,  decelerations:  Absent UC:   regular, every 2-3 minutes SVE:   Dilation: 5.5 Effacement (%): 90 Station: -1 Exam by:: Shawon Denzer SCNM IUPC placed posteriorly without difficulty. Ctx inadequate.  Labs: Lab Results  Component Value Date   WBC 7.8 03/18/2012   HGB 12.1 03/18/2012   HCT 37.5 03/18/2012   MCV 73.8* 03/18/2012   PLT 146* 03/18/2012    Assessment / Plan: Protracted active phase  Labor: start Pitocin  Fetal Wellbeing:  Category I Pain Control:  Epidural Anticipated MOD:  NSVD  [name] 03/19/2012, 4:03 AM

## 2012-03-19 NOTE — Progress Notes (Signed)
I have seen this patient and agree with the above midwife student's note.  LEFTWICH-KIRBY, Pablo Mathurin Certified Nurse-Midwife 

## 2012-03-19 NOTE — Progress Notes (Addendum)
Patient ID: Veronica Love, female   DOB: May 23, 1985, 27 y.o.   MRN: [id number]   S:  Called to room for excessive postpartum bleeding with clots. Pt has soaked through 2-3 pads. Pt denies dizziness. Is desiring to eat breakfast.  O:   Filed Vitals:   03/19/12 0831  BP: 139/75  Pulse: 102  Temp:   Resp:    Exam:  Fundus firm but significant bleeding and clots passed with fundal massage. Bimanual exam done and uterus cleared of clots, seems to be clamped down well now.  Blood loss since delivery close to 500 ml.  Plan to monitor closely. 800 mcg cytotec given PO. CBC in AM.  Napoleon Form, MD

## 2012-03-19 NOTE — Progress Notes (Signed)
Interim progress note  S: Called to bedside by Rn because of fever of 101.2 oral. Pt feels overall well, just having some cramping. No unusual pains, dyspnea, headache, scotoma, RUQ pain, or swelling. Denies increased vaginal pain and dysuria.  Bleeding appropriate  O:  Gen: NAD, alert, cooperative with exam HEENT: NCAT, EOMI, PERRL CV: RRR, good S1/S2, no murmur Resp: CTABL, no wheezes, non-labored Abd: Soft abdomen, fundus firm and palpable around the umbilicus Ext: No edema Neuro: Alert and oriented, No gross deficits  A/P 27 y/o [id number] s/p SVD. Notably had large baby at 10#7oz, baby in NICU due to hypoglycemia. Now with hypertesnion, with previous urine protein will start mag to treat for pre-eclampsia - Fever of unknown origin, will treat with tylenol an dre-check in 1 hour - Cath UA- check for signs of infection and protein  - Recent Urine cre/ratio 0.51 and BP of 185/149 then 144/81 - Start mag, transfer to ICU  Kevin Fenton, MD

## 2012-03-19 NOTE — Progress Notes (Signed)
Veronica Love is a 27 y.o. G3P1011 at [redacted]w[redacted]d  admitted for term labor with SROM.   Subjective:  Comfortable with epidural.    Objective: BP 136/80  Pulse 73  Temp(Src) 98.6 F (37 C) (Oral)  Resp 18  Ht 5' (1.524 m)  Wt 86.183 kg (190 lb)  BMI 37.11 kg/m2  SpO2 100%  LMP 06/12/2011   Total I/O In: -  Out: 600 [Urine:600]  FHT:  FHR: 150 bpm, variability: moderate,  accelerations:  Present,  decelerations:  Absent UC:   regular, every 2 minutes SVE:   Dilation: 5 Effacement (%): 70 Station: -1 Exam by:: Nicholas Trompeter SCNM  Labs: Lab Results  Component Value Date   WBC 7.8 03/18/2012   HGB 12.1 03/18/2012   HCT 37.5 03/18/2012   MCV 73.8* 03/18/2012   PLT 146* 03/18/2012    Assessment / Plan: Spontaneous labor, progressing normally  Labor: Progressing normally Fetal Wellbeing:  Category I Pain Control:  Epidural Anticipated MOD:  NSVD  Veronica Love 03/19/2012, 2:09 AM

## 2012-03-19 NOTE — Progress Notes (Signed)
I have seen this patient and agree with the above midwife student's note.  LEFTWICH-KIRBY, Denelda Akerley Certified Nurse-Midwife 

## 2012-03-19 NOTE — Anesthesia Preprocedure Evaluation (Signed)
Anesthesia Evaluation  Patient identified by MRN, date of birth, ID band Patient awake    Reviewed: Allergy & Precautions, H&P , Patient's Chart, lab work & pertinent test results  Airway Mallampati: II TM Distance: >3 FB Neck ROM: full    Dental no notable dental hx. (+) Teeth Intact   Pulmonary neg pulmonary ROS,  breath sounds clear to auscultation  Pulmonary exam normal       Cardiovascular negative cardio ROS  Rhythm:regular Rate:Normal     Neuro/Psych negative neurological ROS  negative psych ROS   GI/Hepatic negative GI ROS, Neg liver ROS,   Endo/Other  diabetes, Well Controlled, Gestational, Oral Hypoglycemic AgentsMorbid obesity  Renal/GU negative Renal ROS  negative genitourinary   Musculoskeletal   Abdominal Normal abdominal exam  (+)   Peds  Hematology negative hematology ROS (+)   Anesthesia Other Findings   Reproductive/Obstetrics (+) Pregnancy                           Anesthesia Physical Anesthesia Plan  ASA: III  Anesthesia Plan: Epidural   Post-op Pain Management:    Induction:   Airway Management Planned:   Additional Equipment:   Intra-op Plan:   Post-operative Plan:   Informed Consent: I have reviewed the patients History and Physical, chart, labs and discussed the procedure including the risks, benefits and alternatives for the proposed anesthesia with the patient or authorized representative who has indicated his/her understanding and acceptance.     Plan Discussed with: Anesthesiologist  Anesthesia Plan Comments:         Anesthesia Quick Evaluation

## 2012-03-19 NOTE — Progress Notes (Signed)
UR completed 

## 2012-03-19 NOTE — Progress Notes (Signed)
Veronica Love is a 27 y.o. G3P1011 at [redacted]w[redacted]d  admitted for term labor  Subjective: Comfortable with epidural. Feeling lots of vaginal pressure  Objective: BP 153/86  Pulse 88  Temp(Src) 99.1 F (37.3 C) (Oral)  Resp 20  Ht 5' (1.524 m)  Wt 86.183 kg (190 lb)  BMI 37.11 kg/m2  SpO2 100%  LMP 06/12/2011   Total I/O In: -  Out: 1400 [Urine:1400]  FHT:  FHR: 140 bpm, variability: moderate,  accelerations:  Present,  decelerations:  Absent UC:   regular, every 2 minutes SVE:   Dilation: 10 Effacement (%): 90 Station: -1 Exam by:: Damara Klunder SCNM  Labs: Lab Results  Component Value Date   WBC 7.8 03/18/2012   HGB 12.1 03/18/2012   HCT 37.5 03/18/2012   MCV 73.8* 03/18/2012   PLT 146* 03/18/2012    Assessment / Plan: Spontaneous labor, progressing normally Will begin pushing  Labor: Progressing normally Fetal Wellbeing:  Category I Pain Control:  Epidural Anticipated MOD:  NSVD  [name] 03/19/2012, 6:41 AM

## 2012-03-19 NOTE — Anesthesia Procedure Notes (Signed)
Epidural Patient location during procedure: OB Start time: 03/19/2012 1:23 AM  Staffing Anesthesiologist: Malen Gauze, Kacie Huxtable A. Performed by: anesthesiologist   Preanesthetic Checklist Completed: patient identified, site marked, surgical consent, pre-op evaluation, timeout performed, IV checked, risks and benefits discussed and monitors and equipment checked  Epidural Patient position: sitting Prep: site prepped and draped and DuraPrep Patient monitoring: continuous pulse ox and blood pressure Approach: midline Injection technique: LOR air  Needle:  Needle type: Tuohy  Needle gauge: 17 G Needle length: 9 cm and 9 Needle insertion depth: 6 cm Catheter type: closed end flexible Catheter size: 19 Gauge Catheter at skin depth: 11 cm Test dose: negative and Other  Assessment Events: blood not aspirated, injection not painful, no injection resistance, negative IV test and no paresthesia  Additional Notes Patient identified. Risks and benefits discussed including failed block, incomplete  Pain control, post dural puncture headache, nerve damage, paralysis, blood pressure Changes, nausea, vomiting, reactions to medications-both toxic and allergic and post Partum back pain. All questions were answered. Patient expressed understanding and wished to proceed. Sterile technique was used throughout procedure. Epidural site was Dressed with sterile barrier dressing. No paresthesias, signs of intravascular injection Or signs of intrathecal spread were encountered.  Patient was more comfortable after the epidural was dosed. Please see RN's note for documentation of vital signs and FHR which are stable.

## 2012-03-19 NOTE — Progress Notes (Signed)
CSW attempted to meet with MOB to complete assessment, but was informed by RN that she has recently been transferred to [facility] for fever and pre-eclampsia to start mag.  CSW will attempt again at a later time to meet with MOB.

## 2012-03-19 NOTE — Progress Notes (Signed)
I have seen this patient and agree with the above midwife student's note.  LEFTWICH-KIRBY, Maeola Mchaney Certified Nurse-Midwife 

## 2012-03-20 LAB — CBC
MCH: 24.2 pg — ABNORMAL LOW (ref 26.0–34.0)
MCHC: 33 g/dL (ref 30.0–36.0)
MCV: 73.2 fL — ABNORMAL LOW (ref 78.0–100.0)
Platelets: 135 10*3/uL — ABNORMAL LOW (ref 150–400)
RDW: 15.5 % (ref 11.5–15.5)

## 2012-03-20 MED ORDER — HYDROCHLOROTHIAZIDE 25 MG PO TABS
25.0000 mg | ORAL_TABLET | Freq: Every day | ORAL | Status: DC
  Administered 2012-03-21: 25 mg via ORAL
  Filled 2012-03-20: qty 1

## 2012-03-20 MED ORDER — FERROUS SULFATE 325 (65 FE) MG PO TABS
325.0000 mg | ORAL_TABLET | Freq: Two times a day (BID) | ORAL | Status: DC
  Administered 2012-03-20 – 2012-03-21 (×2): 325 mg via ORAL
  Filled 2012-03-20 (×2): qty 1

## 2012-03-20 NOTE — Anesthesia Postprocedure Evaluation (Signed)
  Anesthesia Post-op Note  Patient: Veronica Love  Procedure(s) Performed: * No procedures listed *  Patient Location: ICU  Anesthesia Type:Epidural  Level of Consciousness: awake, alert  and oriented  Airway and Oxygen Therapy: Patient Spontanous Breathing  Post-op Pain: mild  Post-op Assessment: Patient's Cardiovascular Status Stable, Respiratory Function Stable, No signs of Nausea or vomiting, No headache, No backache, No residual numbness and No residual motor weakness  Post-op Vital Signs: stable  Complications: No apparent anesthesia complications

## 2012-03-20 NOTE — Progress Notes (Signed)
Fasting BS obtained with lab draw - 60.  Pt GDM.  Pt denies feeling weak,dizzy, faint.  Given apple juice, cracker per request - will reevaluate in 30 min.

## 2012-03-20 NOTE — Progress Notes (Signed)
I visited with family while rounding on the unit.  They were in good spirits and were appreciative of the care they are receiving.  Veronica Love is feeling better today and they reported that their baby is doing well.  They shared a little of their story and family background with me.  Veronica Love's parents live with them and are caring for their 29 year old daughter while they are here.  They are sad that Veronica Love will likely not be able to take their son home with them when Veronica Love is discharged, but they understand that he is getting the care he needs.    We will continue to follow up with family as we see them in the NICU, but please also page as needs arise.  [employer] Pager, [phone] 1:18 PM   03/20/12 1300  Clinical Encounter Type  Visited With Patient and family together  Visit Type Initial;Spiritual support

## 2012-03-20 NOTE — Clinical Social Work Maternal (Signed)
    Clinical Social Work Department PSYCHOSOCIAL ASSESSMENT - MATERNAL/CHILD 03/20/2012  Patient:  Veronica Love, Veronica Love  Account Number:  [id number]  Admit Date:  03/18/2012  Marjo Bicker Name:   Veronica Love    Clinical Social Worker:  Nobie Putnam, LCSW   Date/Time:  03/20/2012 12:30 PM  Date Referred:  03/20/2012   Referral source  NICU     Referred reason  NICU   Other referral source:    I:  FAMILY / HOME ENVIRONMENT Child's legal guardian:  PARENT  Guardian - Name Guardian - Age Guardian - Address  Veronica Love [address] [place], [place] [address]  Veronica Love  (same as above)   Other household support members/support persons Name Relationship DOB  Veronica Love DAUGHTER 08/10/08   MOTHER    FATHER    Other support:    II  PSYCHOSOCIAL DATA Information Source:  Patient Interview  Event organiser Employment:   Financial resources:  Medicaid If Medicaid - County:  [place] Other  WIC   School / Grade:   Maternity Care Coordinator / Child Services Coordination / Early Interventions:  Cultural issues impacting care:    III  STRENGTHS Strengths  Adequate Resources  Home prepared for Child (including basic supplies)  Supportive family/friends   Strength comment:    IV  RISK FACTORS AND CURRENT PROBLEMS Current Problem:  None   Risk Factor & Current Problem Patient Issue Family Issue Risk Factor / Current Problem Comment   N N     V  SOCIAL WORK ASSESSMENT CSW met with pt & FOB to assess situation & offer resources, as needed.  Pt told CSW that she can understand English however her spouse answered questions, asked of pt. The couple lives with pt's parents & their 29 year old daughter.  Pt is unemployed.  FOB told CSW that they have a car seat & some clothes for the infant.  He told CSW that they are both sad a little about the infants NICU admission but "glad if baby fine."  They have reliable transportation to visit with the infant during hospitalization.   While FOB was able to answer this CSW questions appropriately, CSW thinks this family would benefit for interpreting services. The primary language is R[place].  CSW called [employer] interpreting line but they were not able to provide an interpreter who speaks that language.  CSW requested an interpreter through CSW department.  CSW will continue to follow & provide pt with resources/support as needed.      VI SOCIAL WORK PLAN Social Work Plan  Psychosocial Support/Ongoing Assessment of Needs   Type of pt/family education:   If child protective services report - county:   If child protective services report - date:   Information/referral to community resources comment:   Other social work plan:

## 2012-03-20 NOTE — Progress Notes (Signed)
Post Partum Day 1 Subjective: up ad lib, voiding, tolerating PO, + flatus and no headache or vision changes  Objective: Blood pressure 131/88, pulse 86, temperature 97.7 F (36.5 C), temperature source Oral, resp. rate 18, height 5' (1.524 m), weight 78.155 kg (172 lb 4.8 oz), last menstrual period 06/12/2011, SpO2 99.00%, unknown if currently breastfeeding.  Filed Vitals:   03/20/12 0600 03/20/12 0636 03/20/12 0700 03/20/12 0800  BP: 136/89  132/85 131/88  Pulse: 91  89 86  Temp:    97.7 F (36.5 C)  TempSrc:    Oral  Resp:    18  Height:      Weight:  78.155 kg (172 lb 4.8 oz)    SpO2: 100%  100% 99%     Physical Exam:  General: alert, cooperative and no distress Lochia: appropriate Uterine Fundus: firm Incision: n/a DVT Evaluation: No evidence of DVT seen on physical exam. Negative Homan's sign. No cords or calf tenderness. Mild ankle, lower extremity edema   Recent Labs  03/19/12 0858 03/20/12 0540  HGB 11.4* 9.3*  HCT 35.0* 28.2*    Assessment/Plan: Breastfeeding and Contraception OCPs Preeclampsia:  On mag until noon, observe for 4 hours and move to floor. BP controlled. Postpartum hemorrhage - normal lochia today, Hgb 9.3, will start FeSO4 Baby in NICU   LOS: 2 days   Napoleon Form 03/20/2012, 9:57 AM

## 2012-03-21 MED ORDER — HYDROCHLOROTHIAZIDE 25 MG PO TABS: 25.0000 mg | ORAL_TABLET | Freq: Every day | ORAL | Status: DC

## 2012-03-21 MED ORDER — NORETHINDRONE 0.35 MG PO TABS: ORAL_TABLET | ORAL | Status: DC

## 2012-03-21 MED ORDER — IBUPROFEN 600 MG PO TABS: 600.0000 mg | ORAL_TABLET | Freq: Four times a day (QID) | ORAL | Status: DC

## 2012-03-21 NOTE — Progress Notes (Signed)
Discharge instructions provided to patient and significant other at bedside.  Medications, activity instructions, follow up instructions and community resources discussed.  No questions at this time.  Patient left unit with personal belongings and prescriptions accompanied by staff.  Osvaldo Angst, RN------

## 2012-03-21 NOTE — Discharge Summary (Signed)
Obstetric Discharge Summary Reason for Admission: onset of labor and rupture of membranes Prenatal Procedures: ultrasound for EFW Intrapartum Procedures: spontaneous vaginal delivery with shoulder dystocia x 1 min (see del record) Postpartum Procedures: none Complications-Operative and Postpartum: 2nd degree perineal laceration Hemoglobin  Date Value Range Status  03/20/2012 9.3* 12.0 - 15.0 g/dL Final     DELTA CHECK NOTED     REPEATED TO VERIFY  10/09/2011 11.9   Final     HCT  Date Value Range Status  03/20/2012 28.2* 36.0 - 46.0 % Final  10/09/2011 37   Final   Veronica Love is a 27 y.o. female, G3P1011 at [redacted]w[redacted]d who is a patient of [facility] presenting for suspected ROM at 1900 and contractions starting at the same time. Denies vaginal bleeding. Feeling fetal movement. Gestational diabetic on Glyburide 5mg  PO daily, reports BS mostly good with occasional postprandial as high as 200. Patient failed early glucose screening, probable Type 2 DM. GBS bacteriuria in early pregnancy.  Previous delivery was SVD of 6#15oz infant without difficulty. EFW on U/S today 4780g (10#9oz).   Physical Exam:  General: alert, cooperative and no distress Lungs: nl efford Heart: RRR Lochia: appropriate Uterine Fundus: firm DVT Evaluation: No evidence of DVT seen on physical exam.  Discharge Diagnoses: Term Pregnancy-delivered  Discharge Information: Date: 03/21/2012 Activity: pelvic rest Diet: routine Medications: PNV, Ibuprofen and Micronor to start 04/12/12 Condition: stable Instructions: refer to practice specific booklet Discharge to: home Follow-up Information   Follow up with [facility]. (Follow up in 2 weeks for a blood pressure check (this may be done at home), and then 6 weeks for a postpartum visit)    Contact information:   [address] [place] [address]      PATIENT NEEDS GLUCOSE TOLERANCE TEST AT 6 WEEKS pp. Newborn Data: Live born female  Birth Weight: 10 lb 7.2 oz  (4740 g) APGAR: 6, 7   NICU at present.; pumping breastmilk; desires OCPs for contraception  Cam Hai 03/21/2012, 7:26 AM

## 2012-03-22 LAB — TYPE AND SCREEN
ABO/RH(D): B POS
Unit division: 0
Unit division: 0

## 2012-03-23 ENCOUNTER — Other Ambulatory Visit: Payer: Medicaid Other

## 2012-03-23 ENCOUNTER — Encounter: Payer: Self-pay | Admitting: *Deleted

## 2012-03-24 ENCOUNTER — Encounter: Payer: Self-pay | Admitting: Advanced Practice Midwife

## 2012-03-25 ENCOUNTER — Inpatient Hospital Stay (HOSPITAL_COMMUNITY): Admission: RE | Admit: 2012-03-25 | Payer: Medicaid Other | Source: Ambulatory Visit

## 2012-04-03 ENCOUNTER — Ambulatory Visit: Payer: Medicaid Other | Admitting: Obstetrics and Gynecology

## 2012-04-03 VITALS — BP 109/66 | HR 81 | Wt 150.0 lb

## 2012-04-03 DIAGNOSIS — O99810 Abnormal glucose complicating pregnancy: Secondary | ICD-10-CM

## 2012-04-03 NOTE — Progress Notes (Signed)
Patient states she's stopped taking HCTZ on her own. Cleared to go and ok per Raynelle Fanning. Advised patient to monitor BP and have it managed by her primary care provider (gave [facility] info). Next appt for PP, patient aware. Patient agrees and satisfied.

## 2012-04-20 ENCOUNTER — Ambulatory Visit (INDEPENDENT_AMBULATORY_CARE_PROVIDER_SITE_OTHER): Payer: Medicaid Other | Admitting: Obstetrics and Gynecology

## 2012-04-20 DIAGNOSIS — O99814 Abnormal glucose complicating childbirth: Secondary | ICD-10-CM

## 2012-04-20 DIAGNOSIS — E119 Type 2 diabetes mellitus without complications: Secondary | ICD-10-CM

## 2012-04-20 MED ORDER — DICLOXACILLIN SODIUM 500 MG PO CAPS: 500.0000 mg | ORAL_CAPSULE | Freq: Four times a day (QID) | ORAL | Status: DC

## 2012-04-20 NOTE — Progress Notes (Unsigned)
  Subjective:     Veronica Love is a 27 y.o. female [phone] who presents for a postpartum visit. She is 5 weeks postpartum following a spontaneous vaginal delivery. I have fully reviewed the prenatal and intrapartum course. The delivery was at 40.3 gestational weeks. Outcome: spontaneous vaginal delivery. Anesthesia: epidural. Postpartum course has been significant for preE on MgSO4 and discharged on HCTZ which she is still taking. No hx pregestational HTN. Had A2 GDM and needs 2 hr OGTT. Had onset fever/chills and pain in left upper breast yesterday. Since then has stopped nursing (due to pain) but is not ready to wean. Has not taken anything for the pain. Baby's course has been significant for 1 wk NICU stay to regulate hypoglycemia. BW was 10#7.  Baby is feeding by bottle only since yesterday. . Bleeding no bleeding. Bowel function is normal. Bladder function is normal. Patient is not sexually active. Contraception method is oral progesterone-only contraceptive. Postpartum depression screening: negative.  The following portions of the patient's history were reviewed and updated as appropriate: allergies, current medications, past family history, past medical history, past social history, past surgical history and problem list.  Review of Systems Pertinent items are noted in HPI.   Objective:    BP 101/69  Pulse 108  Temp(Src) 100.5 F (38.1 C) (Oral)  Wt 147 lb 4.8 oz (66.815 kg)  BMI 28.77 kg/m2  Breastfeeding? Yes  General:  alert and no distress   Breasts:  right breast 10-2 o'clock mild erythema and TTP. No discrete mass. No nipple inflamation  Lungs: clear to auscultation bilaterally  Heart:  regular rate and rhythm, S1, S2 normal, no murmur, click, rub or gallop  Abdomen: soft, non-tender; bowel sounds normal; no masses,  no organomegaly and large diastasis   Vulva:  normal Well healed perineum  Vagina: normal vagina Decreased tone and support  Cervix:  multiparous appearance  Corpus:  normal slightly enlarged  Adnexa:  no mass, fullness, tenderness  Rectal Exam: Not performed.        Assessment:     5 wks postpartum exam. Pap smear not done at today's visit.  Early lactational mastitis right breast S/P preE and A2/B DM Plan:    1. Contraception: oral progesterone-only contraceptive 2. Lactation consultant to see pt today. Keep nursing on both breasts. Rx Dicloxicillin for early mastitis right breast Advised ibuprofen/tylenol for any temp>100.4 Stop HCTZ 3. Return fasting for 2 hr OGTT asap.  Follow up in: 6 months or as needed.   Interpreter used for visit.

## 2012-04-20 NOTE — Progress Notes (Unsigned)
LC in to see patient at 1445 as requested from CNM for mastitis.  Upon assessment reddened area on right breast medial upper quadrant semi-warm to touch; semi-firm, not hard, tender.  Nipple and areola appeared WNL; mom denied any burning or itching pain. LC was able to hand express some milk and asked mom to hand-express with return demonstration; milk noted.  Mom brought a friend to interpret.  Mom said symptoms began 2 days ago (Saturday) and she quit breastfeeding on right side but continued to breastfeed on left and supplement with formula.  Stated she pumped the right breast only once on Saturday and twice today with a hand pump.  Reviewed milk production with mom and the need to continue breastfeeding on right side to maintain milk supply; assured mom the milk is safe for the baby and that the infection is in the breast tissue not the milk.  Gave mom a "breastfeeding teaching sheet" with additional hand-written instructions related specifically to mastitis care which friend then read to mom and interpreted.    Other instructions given were: 1.  To feed on right breast with each feeding and if the baby does not feed from right breast then use hand pump to pump milk. 2.  Point nose or chin toward affected area. 3.  Apply wet or dry heat with a warm wet cloth or heating pad to affected area. 4.  Massage affected area during feeding or pumping to drain the breast. 5.  Take medications as prescribed by CNM  Encouraged mom to follow-up with Clinic if mastitis symptoms do not get better in the next few days or if new symptoms appear.    Signed. Mathews Argyle, RN, MSN, [employer]

## 2012-04-21 ENCOUNTER — Telehealth: Payer: Self-pay | Admitting: *Deleted

## 2012-04-21 DIAGNOSIS — E119 Type 2 diabetes mellitus without complications: Secondary | ICD-10-CM | POA: Insufficient documentation

## 2012-04-21 LAB — GLUCOSE TOLERANCE, 2 HOURS W/ 1HR
Glucose, 1 hour: 231 mg/dL — ABNORMAL HIGH (ref 70–170)
Glucose, 2 hour: 211 mg/dL — ABNORMAL HIGH (ref 70–139)
Glucose, Fasting: 75 mg/dL (ref 70–99)

## 2012-04-21 NOTE — Telephone Encounter (Addendum)
Message copied by Jill Side on Tue Apr 21, 2012 12:47 PM ------      Message from: Danae Orleans      Created: Tue Apr 21, 2012 11:45 AM       Please refer to PMD for DM management. Failed 2 hr postpartum ------ Called pt with [facility] Interpreter # [phone]. I informed her of the abnormal 2hr GTT, diagnosis of diabetes and need for follow up. She states she does not have a PCP or insurance. I stated that I will make referral to [facility] and then contact her with appt details. I asked her to follow the same diabetic diet that she used while pregnant. She will receive additional health care instructions once she has had evaluation at [facility]. Pt voiced understanding.

## 2012-04-22 ENCOUNTER — Other Ambulatory Visit: Payer: Medicaid Other

## 2012-04-23 NOTE — Telephone Encounter (Signed)
Referral form was faxed to [facility], they will contact patient with appointment.

## 2012-04-28 ENCOUNTER — Ambulatory Visit (INDEPENDENT_AMBULATORY_CARE_PROVIDER_SITE_OTHER): Payer: Medicaid Other | Admitting: Family Medicine

## 2012-04-28 ENCOUNTER — Encounter: Payer: Self-pay | Admitting: Family Medicine

## 2012-04-28 VITALS — BP 112/76 | HR 69 | Temp 98.2°F | Ht 60.5 in | Wt 147.0 lb

## 2012-04-28 DIAGNOSIS — N61 Mastitis without abscess: Secondary | ICD-10-CM | POA: Insufficient documentation

## 2012-04-28 DIAGNOSIS — E119 Type 2 diabetes mellitus without complications: Secondary | ICD-10-CM

## 2012-04-28 DIAGNOSIS — H539 Unspecified visual disturbance: Secondary | ICD-10-CM

## 2012-04-28 DIAGNOSIS — Z131 Encounter for screening for diabetes mellitus: Secondary | ICD-10-CM

## 2012-04-28 LAB — LIPID PANEL
Cholesterol: 211 mg/dL — ABNORMAL HIGH (ref 0–200)
Total CHOL/HDL Ratio: 4.5 Ratio
Triglycerides: 103 mg/dL (ref <150)
VLDL: 21 mg/dL (ref 0–40)

## 2012-04-28 LAB — BASIC METABOLIC PANEL
Calcium: 9.4 mg/dL (ref 8.4–10.5)
Glucose, Bld: 79 mg/dL (ref 70–99)
Potassium: 4.1 mEq/L (ref 3.5–5.3)
Sodium: 142 mEq/L (ref 135–145)

## 2012-04-28 NOTE — Assessment & Plan Note (Signed)
Right breast mastitis improved Breast exam not done today. She is instructed to return in 1 wk after completing A/B for reassessment. She verbalized understanding.

## 2012-04-28 NOTE — Patient Instructions (Addendum)

## 2012-04-28 NOTE — Progress Notes (Signed)
Subjective:     Patient ID: Veronica Love, female   DOB: [birth year], 27 y.o.   MRN: [id number]  HPI DM 2: Patient was diagnosed with DM during pregnancy,she had her baby 03/19/12,she however denies hx of DM,she stated she is unsure she has it and has never been on med for DM,no hyperglycemic symptoms. Eye problem: Over 2 yrs she has had blurry vision,no change in symptoms,no pain,she denies prior use of eye glass. Mastitis:Patient is currently on A/B treatment for right breast mastitis which she stated has resolved,she has 1 more day to complete her A/B,she denies breast pain or lump,she continues to breast feed her baby.  Past Medical History  Diagnosis Date  . No pertinent past medical history   . Gestational diabetes 2013    The patient has a family history of   Review of Systems  Constitutional: Negative for fever and fatigue.  Eyes: Positive for visual disturbance. Negative for pain and discharge.       Chronic eye problem  Respiratory: Negative.   Cardiovascular: Negative.   Genitourinary: Negative.   Neurological: Negative.   All other systems reviewed and are negative.       Objective:   Physical Exam  Nursing note and vitals reviewed. Constitutional: She is oriented to person, place, and time. She appears well-developed. No distress.  Eyes: Conjunctivae are normal. Pupils are equal, round, and reactive to light.  Cardiovascular: Normal rate, regular rhythm and normal heart sounds.   No murmur heard. Pulmonary/Chest: Effort normal and breath sounds normal. No respiratory distress. She has no wheezes.  Abdominal: Soft. Bowel sounds are normal. She exhibits no distension. There is no tenderness.  Musculoskeletal: Normal range of motion. She exhibits no edema.  Neurological: She is alert and oriented to person, place, and time. No cranial nerve deficit.        Assessment/lan:

## 2012-04-28 NOTE — Assessment & Plan Note (Signed)
No acute change in symptom. Referral to ophthalmology done for assessment.

## 2012-04-28 NOTE — Assessment & Plan Note (Addendum)
She had gestational DM DM screening test done today,A1C report 6.1,GDM likely resolved. BMP,FLP checked Diet and exercise counseling to manage DM done,instruction given. F/U O/B for OGTT as scheduled.

## 2012-04-30 ENCOUNTER — Encounter: Payer: Self-pay | Admitting: *Deleted

## 2012-05-05 ENCOUNTER — Ambulatory Visit (INDEPENDENT_AMBULATORY_CARE_PROVIDER_SITE_OTHER): Payer: Medicaid Other | Admitting: Family Medicine

## 2012-05-05 ENCOUNTER — Encounter: Payer: Self-pay | Admitting: Family Medicine

## 2012-05-05 VITALS — BP 125/84 | HR 66 | Temp 98.0°F | Ht 60.5 in | Wt 148.0 lb

## 2012-05-05 DIAGNOSIS — O24419 Gestational diabetes mellitus in pregnancy, unspecified control: Secondary | ICD-10-CM

## 2012-05-05 DIAGNOSIS — E785 Hyperlipidemia, unspecified: Secondary | ICD-10-CM | POA: Insufficient documentation

## 2012-05-05 DIAGNOSIS — O99810 Abnormal glucose complicating pregnancy: Secondary | ICD-10-CM

## 2012-05-05 DIAGNOSIS — N61 Mastitis without abscess: Secondary | ICD-10-CM

## 2012-05-05 DIAGNOSIS — H539 Unspecified visual disturbance: Secondary | ICD-10-CM

## 2012-05-05 NOTE — Assessment & Plan Note (Signed)
1. I reviewed and discussed leb result with patient,A1c 6.1,serum gluc 72. Patient negative for DM but at risk. Diet and exercise counseling done. She is instructed to return in 4 month for reassessment.

## 2012-05-05 NOTE — Progress Notes (Addendum)
Subjective:     Patient ID: Veronica Love, female   DOB: May 18, 1985, 27 y.o.   MRN: [id number]  HPI HQI:ONGE for follow up,she is not on DM med,she is working on diet and exercise.No GU symptoms. Eye problem:No change in her vision,she has appointment set up with ophthalmologist on May 22. Mastitis:She has been taking Dicloxacillin one tab daily for about 2 wks instead of 4 times daily,she was not clear of the instruction given to her by her obstetrician.She denies any breast pain,feels well now.No fever. Lab f/U: Here to follow up with FLP.  Past Medical History  Diagnosis Date  . No pertinent past medical history   . Gestational diabetes 2013    Review of Systems  Respiratory: Negative.   Cardiovascular: Negative.   Gastrointestinal: Negative.   Genitourinary: Negative.   All other systems reviewed and are negative.   Filed Vitals:   05/05/12 0947  BP: 125/84  Pulse: 66  Temp: 98 F (36.7 C)  TempSrc: Oral  Height: 5' 0.5" (1.537 m)  Weight: 148 lb (67.132 kg)       Objective:   Physical Exam  Nursing note and vitals reviewed. Constitutional: She is oriented to person, place, and time. She appears well-developed. No distress.  Cardiovascular: Normal rate, regular rhythm and normal heart sounds.   No murmur heard. Pulmonary/Chest: Effort normal and breath sounds normal. No respiratory distress. She has no wheezes. She exhibits no mass. Right breast exhibits no inverted nipple, no mass, no nipple discharge, no skin change and no tenderness. Left breast exhibits no inverted nipple, no mass, no nipple discharge, no skin change and no tenderness. Breasts are symmetrical.  Abdominal: Soft. Bowel sounds are normal. She exhibits no distension. There is no tenderness.  Musculoskeletal: Normal range of motion. She exhibits no edema.  Neurological: She is alert and oriented to person, place, and time.       Assessment:    Gestational DM : Resolved   Vision problem: No acute  change Mastitis: Resolved Hyperlipidemia    Plan:     1. I reviewed and discussed leb result with patient,A1c 6.1,serum gluc 72. Patient negative for DM but at risk. Diet and exercise counseling done. She is instructed to return in 4 month for reassessment.  2. Follow up with ophthalmologist as scheduled.  3. Breast exam benign,patient instructed to d/c antibiotic,continue breast feeding.  4. I discussed FLP result with her,she does have elevated Chol and LDL,her cardiac risk is however less than 1%,diet and exercise recommended.

## 2012-05-05 NOTE — Assessment & Plan Note (Signed)
She does have elevated Chol and LDL,cardiac risk is less than 1%,diet and exercise recommended at this time.

## 2012-05-05 NOTE — Assessment & Plan Note (Signed)
2. Follow up with ophthalmologist as scheduled.

## 2012-05-05 NOTE — Patient Instructions (Signed)
Nice to see you again Veronica Love, Your breast exam is normal today and you may stop your antibiotic,also your DM screening test is normal,but you do have slight risk for DM,hence i recommend diet control and good exercise,I will see you back in 4 month for recheck or sooner if any problem.      Gestational Diabetes Mellitus Gestational diabetes mellitus (GDM) is diabetes that occurs only during pregnancy. This happens when the body cannot properly handle the glucose (sugar) that increases in the blood after eating. During pregnancy, insulin resistance (reduced sensitivity to insulin) occurs because of the release of hormones from the placenta. Usually, the pancreas of pregnant women produces enough insulin to overcome the resistance that occurs. However, in gestational diabetes, the insulin is there but it does not work effectively. If the resistance is severe enough that the pancreas does not produce enough insulin, extra glucose builds up in the blood.  WHO IS AT RISK FOR DEVELOPING GESTATIONAL DIABETES?  Women with a history of diabetes in the family.  Women over age 9.  Women who are overweight.  Women in certain ethnic groups (Hispanic, African American, Native American, [place] and [place] Islander). WHAT CAN HAPPEN TO THE BABY? If the mother's blood glucose is too high while she is pregnant, the extra sugar will travel through the umbilical cord to the baby. Some of the problems the baby may have are:  Large Baby - If the baby receives too much sugar, the baby will gain more weight. This may cause the baby to be too large to be born normally (vaginally) and a Cesarean section (C-section) may be needed.  Low Blood Glucose (hypoglycemia)  The baby makes extra insulin, in response to the extra sugar its gets from its mother. When the baby is born and no longer needs this extra insulin, the baby's blood glucose level may drop.  Jaundice (yellow coloring of the skin and eyes)  This is fairly  common in babies. It is caused from a build-up of the chemical called bilirubin. This is rarely serious, but is seen more often in babies whose mothers had gestational diabetes. RISKS TO THE MOTHER Women who have had gestational diabetes may be at higher risk for some problems, including:  Preeclampsia or toxemia, which includes problems with high blood pressure. Blood pressure and protein levels in the urine must be checked frequently.  Infections.  Cesarean section (C-section) for delivery.  Developing Type 2 diabetes later in life. About 30-50% will develop diabetes later, especially if obese. DIAGNOSIS  The hormones that cause insulin resistance are highest at about 24-28 weeks of pregnancy. If symptoms are experienced, they are much like symptoms you would normally expect during pregnancy.  GDM is often diagnosed using a two part method: 1. After 24-28 weeks of pregnancy, the woman drinks a glucose solution and takes a blood test. If the glucose level is high, a second test will be given. 2. Oral Glucose Tolerance Test (OGTT) which is 3 hours long  After not eating overnight, the blood glucose is checked. The woman drinks a glucose solution, and hourly blood glucose tests are taken. If the woman has risk factors for GDM, the caregiver may test earlier than 24 weeks of pregnancy. TREATMENT  Treatment of GDM is directed at keeping the mother's blood glucose level normal, and may include:  Meal planning.  Taking insulin or other medicine to control your blood glucose level.  Exercise.  Keeping a daily record of the foods you eat.  Blood glucose monitoring and keeping a record of your blood glucose levels.  May monitor ketone levels in the urine, although this is no longer considered necessary in most pregnancies. HOME CARE INSTRUCTIONS  While you are pregnant:  Follow your caregiver's advice regarding your prenatal appointments, meal planning, exercise, medicines, vitamins, blood  and other tests, and physical activities.  Keep a record of your meals, blood glucose tests, and the amount of insulin you are taking (if any). Show this to your caregiver at every prenatal visit.  If you have GDM, you may have problems with hypoglycemia (low blood glucose). You may suspect this if you become suddenly dizzy, feel shaky, and/or weak. If you think this is happening and you have a glucose meter, try to test your blood glucose level. Follow your caregiver's advice for when and how to treat your low blood glucose. Generally, the 15:15 rule is followed: Treat by consuming 15 grams of carbohydrates, wait 15 minutes, and recheck blood glucose. Examples of 15 grams of carbohydrates are:  1 cup skim or low-fat milk.   cup juice.  3-4 glucose tablets.  5-6 hard candies.  1 small box raisins.   cup regular soda pop.  Practice good hygiene, to avoid infections.  Do not smoke. SEEK MEDICAL CARE IF:   You develop abnormal vaginal discharge, with or without itching.  You become weak and tired more than expected.  You seem to sweat a lot.  You have a sudden increase in weight, 5 pounds or more in one week.  You are losing weight, 3 pounds or more in a week.  Your blood glucose level is high, and you need instructions on what to do about it. SEEK IMMEDIATE MEDICAL CARE IF:   You develop a severe headache.  You faint or pass out.  You develop nausea and vomiting.  You become disoriented or confused.  You have a convulsion.  You develop vision problems.  You develop stomach pain.  You develop vaginal bleeding.  You develop uterine contractions.  You have leaking or a gush of fluid from the vagina. AFTER YOU HAVE THE BABY:  Go to all of your follow-up appointments, and have blood tests as advised by your caregiver.  Maintain a healthy lifestyle, to prevent diabetes in the future. This includes:  Following a healthy meal plan.  Controlling your  weight.  Getting enough exercise and proper rest.  Do not smoke.  Breastfeed your baby if you can. This will lower the chance of you and your baby developing diabetes later in life. For more information about diabetes, go to the American Diabetes Association at: PMFashions.com.cy. For more information about gestational diabetes, go to the [employer] at: RentRule.com.au. Document Released: 04/22/2000 Document Revised: 04/08/2011 Document Reviewed: 11/14/2008 [facility] Patient Information 2013 [place], [place].

## 2012-05-05 NOTE — Assessment & Plan Note (Signed)
3. Breast exam benign,patient instructed to d/c antibiotic,continue breast feeding.

## 2012-11-05 ENCOUNTER — Other Ambulatory Visit (HOSPITAL_COMMUNITY): Payer: Self-pay | Admitting: Nurse Practitioner

## 2012-11-05 DIAGNOSIS — Z3689 Encounter for other specified antenatal screening: Secondary | ICD-10-CM

## 2012-11-05 LAB — OB RESULTS CONSOLE HGB/HCT, BLOOD
HCT: 36 %
Hemoglobin: 11.7 g/dL

## 2012-11-05 LAB — OB RESULTS CONSOLE GC/CHLAMYDIA: Gonorrhea: NEGATIVE

## 2012-11-05 LAB — CULTURE, OB URINE: Urine Culture, OB: NEGATIVE

## 2012-11-10 ENCOUNTER — Ambulatory Visit (HOSPITAL_COMMUNITY)
Admission: RE | Admit: 2012-11-10 | Discharge: 2012-11-10 | Disposition: A | Discharge status: Home / Self Care | Payer: Medicaid Other | Source: Ambulatory Visit | Attending: Nurse Practitioner | Admitting: Nurse Practitioner

## 2012-11-10 ENCOUNTER — Other Ambulatory Visit (HOSPITAL_COMMUNITY): Payer: Self-pay | Admitting: Nurse Practitioner

## 2012-11-10 DIAGNOSIS — Z3689 Encounter for other specified antenatal screening: Secondary | ICD-10-CM | POA: Insufficient documentation

## 2012-11-10 DIAGNOSIS — O09299 Supervision of pregnancy with other poor reproductive or obstetric history, unspecified trimester: Secondary | ICD-10-CM | POA: Insufficient documentation

## 2012-11-18 ENCOUNTER — Encounter: Payer: Self-pay | Admitting: *Deleted

## 2012-11-23 ENCOUNTER — Ambulatory Visit (INDEPENDENT_AMBULATORY_CARE_PROVIDER_SITE_OTHER): Payer: Medicaid Other | Admitting: Obstetrics & Gynecology

## 2012-11-23 ENCOUNTER — Encounter: Payer: Medicaid Other | Attending: Obstetrics & Gynecology | Admitting: *Deleted

## 2012-11-23 ENCOUNTER — Encounter: Payer: Self-pay | Admitting: Obstetrics & Gynecology

## 2012-11-23 ENCOUNTER — Encounter: Payer: Self-pay | Admitting: *Deleted

## 2012-11-23 ENCOUNTER — Other Ambulatory Visit: Payer: Self-pay | Admitting: Obstetrics & Gynecology

## 2012-11-23 VITALS — BP 110/66 | Temp 97.0°F | Wt 151.3 lb

## 2012-11-23 DIAGNOSIS — O99810 Abnormal glucose complicating pregnancy: Secondary | ICD-10-CM | POA: Insufficient documentation

## 2012-11-23 DIAGNOSIS — O0990 Supervision of high risk pregnancy, unspecified, unspecified trimester: Secondary | ICD-10-CM

## 2012-11-23 DIAGNOSIS — O0992 Supervision of high risk pregnancy, unspecified, second trimester: Secondary | ICD-10-CM

## 2012-11-23 DIAGNOSIS — Z603 Acculturation difficulty: Secondary | ICD-10-CM

## 2012-11-23 DIAGNOSIS — O24419 Gestational diabetes mellitus in pregnancy, unspecified control: Secondary | ICD-10-CM

## 2012-11-23 DIAGNOSIS — Z789 Other specified health status: Secondary | ICD-10-CM

## 2012-11-23 DIAGNOSIS — Z713 Dietary counseling and surveillance: Secondary | ICD-10-CM | POA: Insufficient documentation

## 2012-11-23 DIAGNOSIS — Z609 Problem related to social environment, unspecified: Secondary | ICD-10-CM

## 2012-11-23 DIAGNOSIS — Z23 Encounter for immunization: Secondary | ICD-10-CM

## 2012-11-23 LAB — COMPREHENSIVE METABOLIC PANEL
Albumin: 4.1 g/dL (ref 3.5–5.2)
Alkaline Phosphatase: 49 U/L (ref 39–117)
CO2: 22 mEq/L (ref 19–32)
Calcium: 9.1 mg/dL (ref 8.4–10.5)
Chloride: 105 mEq/L (ref 96–112)
Creat: 0.49 mg/dL — ABNORMAL LOW (ref 0.50–1.10)
Glucose, Bld: 84 mg/dL (ref 70–99)
Potassium: 3.8 mEq/L (ref 3.5–5.3)
Sodium: 139 mEq/L (ref 135–145)
Total Protein: 7.3 g/dL (ref 6.0–8.3)

## 2012-11-23 LAB — CBC
HCT: 33.2 % — ABNORMAL LOW (ref 36.0–46.0)
MCH: 24.5 pg — ABNORMAL LOW (ref 26.0–34.0)
MCHC: 34 g/dL (ref 30.0–36.0)
Platelets: 206 10*3/uL (ref 150–400)
RBC: 4.62 MIL/uL (ref 3.87–5.11)
WBC: 6.9 10*3/uL (ref 4.0–10.5)

## 2012-11-23 LAB — POCT URINALYSIS DIP (DEVICE)
Bilirubin Urine: NEGATIVE
Glucose, UA: NEGATIVE mg/dL
Hgb urine dipstick: NEGATIVE
Ketones, ur: NEGATIVE mg/dL
Specific Gravity, Urine: <=1.005 (ref 1.005–1.030)
Urobilinogen, UA: 0.2 mg/dL (ref 0.0–1.0)
pH: 6 (ref 5.0–8.0)

## 2012-11-23 LAB — HEMOGLOBIN A1C: Hgb A1c MFr Bld: 5.9 % — ABNORMAL HIGH (ref <5.7)

## 2012-11-23 MED ORDER — ACCU-CHEK FASTCLIX LANCETS MISC: 1.0000 | Freq: Four times a day (QID) | Status: DC

## 2012-11-23 MED ORDER — GLUCOSE BLOOD VI STRP: ORAL_STRIP | Status: DC

## 2012-11-23 MED ORDER — TETANUS-DIPHTH-ACELL PERTUSSIS 5-2.5-18.5 LF-MCG/0.5 IM SUSP
0.5000 mL | Freq: Once | INTRAMUSCULAR | Status: AC
  Administered 2012-11-23: 0.5 mL via INTRAMUSCULAR

## 2012-11-23 NOTE — Progress Notes (Signed)
[place] interpreter present.  Patient is a transfer from [facility] for GDM; had positive 3 hr GTT a [redacted]w[redacted]d.  H/O GDM last pregnancy, likley Class B/Type II DM.  Discussed implications of GDM, increased maternal/fetal morbidity and mortality and need for good glycemic control. All questions answered. She will meet with DM educator today.  Baseline labs will be checked, also will schedule for ECHO and Optho appointments.  Flu and Tdap vaccines given today. No other complaints or concerns.  Fetal movement and labor precautions reviewed.

## 2012-11-23 NOTE — Progress Notes (Signed)
Pulse- 84 Weight gain 25-35lbs  Flu and tdap vaccine consented info given

## 2012-11-23 NOTE — Progress Notes (Signed)
DIABETES: [facility] New [facility] GDM.  Patient was seen on 11/23/12 for Gestational Diabetes self-management education The following learning objectives were met by the patient with the assistance of interpreter:   States the definition of Gestational Diabetes  States when to check blood glucose levels  Demonstrates proper blood glucose monitoring techniques  States the effect of stress and exercise on blood glucose levels  Consider  increasing your activity level by walking daily as tolerated Begin checking BG before breakfast and 1-2 hours after first bit of breakfast, lunch and dinner after  as directed by MD  Take medication  as directed by MD  Blood glucose monitor given: Accu [employer]k Nano BG Monitoring Kit Lot # [id number] Exp: 12/27/13 Blood glucose reading: 99   Patient instructed to monitor glucose levels: FBS: 60 - <90 1 hour: <140 2 hour: <120  Patient will be seen for follow-up as needed.

## 2012-11-23 NOTE — Progress Notes (Signed)
Fetal Echo scheduled with Dr. Elizebeth Brooking for 12/14/12 at 9:30 am. Retinal scan scheduled with [facility] for 11/24/12 at 9 am.  Instructed to take $3.00 copay.

## 2012-11-23 NOTE — Patient Instructions (Signed)
Return to clinic for any obstetric concerns or go to MAU for evaluation  

## 2012-11-23 NOTE — Progress Notes (Signed)
Nutrition note: 1st visit consult/ GDM diet education Pt had GDM during previous pregnancy. Pt has gained 10.3# @ [redacted]w[redacted]d, which is wnl. Pt reports eating 3 meals/d & drinks only water. Pt is taking PNV.  Pt reports no N/V or heartburn. Pt reports walking 2-3x/wk. Pt received verbal & written education via interpreter about GDM diet (reviewed). Discussed wt gain goals of 15-25# or 0.6#/wk. Pt agrees to follow GDM diet with CHO/ protein combination. Pt has WIC. F/u in 4-6 wks Blondell Reveal, MS, RD, LDN

## 2012-11-24 ENCOUNTER — Ambulatory Visit (INDEPENDENT_AMBULATORY_CARE_PROVIDER_SITE_OTHER): Payer: Medicaid Other | Admitting: Home Health Services

## 2012-11-24 DIAGNOSIS — O24419 Gestational diabetes mellitus in pregnancy, unspecified control: Secondary | ICD-10-CM

## 2012-11-24 DIAGNOSIS — O99810 Abnormal glucose complicating pregnancy: Secondary | ICD-10-CM

## 2012-11-24 LAB — PRESCRIPTION MONITORING PROFILE (19 PANEL)
Barbiturate Screen, Urine: NEGATIVE ng/mL
Benzodiazepine Screen, Urine: NEGATIVE ng/mL
Carisoprodol, Urine: NEGATIVE ng/mL
Fentanyl, Ur: NEGATIVE ng/mL
Nitrites, Initial: NEGATIVE ug/mL
Opiate Screen, Urine: NEGATIVE ng/mL
Propoxyphene: NEGATIVE ng/mL
Tapentadol, urine: NEGATIVE ng/mL
Tramadol Scrn, Ur: NEGATIVE ng/mL
Zolpidem, Urine: NEGATIVE ng/mL
pH, Initial: 6.2 pH (ref 4.5–8.9)

## 2012-11-24 LAB — PROTEIN / CREATININE RATIO, URINE
Creatinine, Urine: 24 mg/dL
Total Protein, Urine: <3 mg/dL

## 2012-11-24 LAB — ALCOHOL METABOLITE (ETG), URINE: Ethyl Glucuronide (EtG): NEGATIVE ng/mL

## 2012-11-24 NOTE — Progress Notes (Signed)
DIABETES Pt came in to have a retinal scan per diabetic care.   Image was taken and submitted to [facility]. Garg for reading.    Results will be available in 1-2 weeks.  Results will be given to PCP for review and to contact patient.  Veronica Love  

## 2012-11-25 ENCOUNTER — Encounter: Payer: Self-pay | Admitting: *Deleted

## 2012-11-25 NOTE — Addendum Note (Signed)
Addended by: Franchot Mimes on: 11/25/2012 12:14 PM   Modules accepted: Orders

## 2012-11-26 LAB — CREATININE CLEARANCE, URINE, 24 HOUR
Creatinine Clearance: 206 mL/min — ABNORMAL HIGH (ref 75–115)
Creatinine, 24H Ur: 1457 mg/d (ref 700–1800)
Creatinine: 0.49 mg/dL — ABNORMAL LOW (ref 0.50–1.10)

## 2012-11-26 LAB — T3, FREE: T3, Free: 2.9 pg/mL (ref 2.3–4.2)

## 2012-11-26 LAB — T4, FREE: Free T4: 1.17 ng/dL (ref 0.80–1.80)

## 2012-11-27 ENCOUNTER — Encounter: Payer: Self-pay | Admitting: *Deleted

## 2012-11-27 ENCOUNTER — Encounter: Payer: Self-pay | Admitting: Obstetrics & Gynecology

## 2012-11-30 ENCOUNTER — Encounter: Payer: Medicaid Other | Attending: Obstetrics & Gynecology | Admitting: *Deleted

## 2012-11-30 ENCOUNTER — Ambulatory Visit (INDEPENDENT_AMBULATORY_CARE_PROVIDER_SITE_OTHER): Payer: Medicaid Other | Admitting: Family Medicine

## 2012-11-30 ENCOUNTER — Encounter: Payer: Self-pay | Admitting: Family Medicine

## 2012-11-30 VITALS — BP 113/69 | Temp 97.0°F | Wt 153.4 lb

## 2012-11-30 DIAGNOSIS — O99810 Abnormal glucose complicating pregnancy: Secondary | ICD-10-CM

## 2012-11-30 DIAGNOSIS — O24419 Gestational diabetes mellitus in pregnancy, unspecified control: Secondary | ICD-10-CM

## 2012-11-30 DIAGNOSIS — Z713 Dietary counseling and surveillance: Secondary | ICD-10-CM | POA: Insufficient documentation

## 2012-11-30 LAB — POCT URINALYSIS DIP (DEVICE)
Bilirubin Urine: NEGATIVE
Glucose, UA: NEGATIVE mg/dL
Ketones, ur: NEGATIVE mg/dL
Nitrite: NEGATIVE
Protein, ur: NEGATIVE mg/dL
Specific Gravity, Urine: 1.015 (ref 1.005–1.030)
pH: 7 (ref 5.0–8.0)

## 2012-11-30 NOTE — Progress Notes (Signed)
S: 27 yo G4P2012 @ [redacted]w[redacted]d HRC for likely type B DM   1) DM  - fasting- 88-98 Breakfast- all at goal but one outlier of 130 Lunch- 100-135 Dinner: 93-140 - eats lots of rice and vegetables  No ctx, lof, vb. +FM  O: see flowsheet  A/P - sugars high but likely more diet related.  - will have her see diabetes education and nutrition in order to discuss diet - told she gets one more chance to control her diet better and limit rice and should her sugars still be elevated, will need to be started on medicine.   - f/u in 2 weeks with sugars at that time.

## 2012-11-30 NOTE — Progress Notes (Signed)
DIABETES: Veronica Love presents with family member as interpreter. Glucose readings elevated as noted by Dr. Reola Calkins. Patient has been directed to have only 1/2 cup of rice with any meal. She will be returning in 2 week for f/u. At that time if readings are not WNL will consider medication. Patient noted medication was required with last pregnancy.

## 2012-11-30 NOTE — Progress Notes (Signed)
P-85 

## 2012-12-11 ENCOUNTER — Encounter: Payer: Self-pay | Admitting: *Deleted

## 2012-12-14 ENCOUNTER — Ambulatory Visit (INDEPENDENT_AMBULATORY_CARE_PROVIDER_SITE_OTHER): Payer: Medicaid Other | Admitting: Obstetrics and Gynecology

## 2012-12-14 VITALS — BP 109/70 | Temp 97.8°F | Wt 153.1 lb

## 2012-12-14 DIAGNOSIS — O99810 Abnormal glucose complicating pregnancy: Secondary | ICD-10-CM

## 2012-12-14 DIAGNOSIS — O24419 Gestational diabetes mellitus in pregnancy, unspecified control: Secondary | ICD-10-CM

## 2012-12-14 LAB — POCT URINALYSIS DIP (DEVICE)
Glucose, UA: 500 mg/dL — AB
Hgb urine dipstick: NEGATIVE
Ketones, ur: 15 mg/dL — AB
Specific Gravity, Urine: 1.025 (ref 1.005–1.030)
Urobilinogen, UA: 1 mg/dL (ref 0.0–1.0)

## 2012-12-14 MED ORDER — GLYBURIDE 2.5 MG PO TABS: 2.5000 mg | ORAL_TABLET | Freq: Every evening | ORAL | Status: DC

## 2012-12-14 MED ORDER — PRENATAL VITAMINS 0.8 MG PO TABS: 1.0000 | ORAL_TABLET | Freq: Every day | ORAL | Status: DC

## 2012-12-14 NOTE — Progress Notes (Signed)
P-101 

## 2012-12-14 NOTE — Progress Notes (Signed)
[place] interpreter here. CBGs reviewed> 4/7 fastings in 90's and PPs dinner most elevated. D/W Nancy> start glyburide 2.5 mg hs. . Add hs snack and pm exercise. See DM educator. Rx PNV.

## 2012-12-14 NOTE — Patient Instructions (Signed)

## 2012-12-21 ENCOUNTER — Ambulatory Visit (INDEPENDENT_AMBULATORY_CARE_PROVIDER_SITE_OTHER): Payer: Medicaid Other | Admitting: Advanced Practice Midwife

## 2012-12-21 VITALS — BP 112/75 | Temp 98.2°F | Wt 154.7 lb

## 2012-12-21 DIAGNOSIS — O99810 Abnormal glucose complicating pregnancy: Secondary | ICD-10-CM

## 2012-12-21 DIAGNOSIS — O24419 Gestational diabetes mellitus in pregnancy, unspecified control: Secondary | ICD-10-CM

## 2012-12-21 LAB — POCT URINALYSIS DIP (DEVICE)
Bilirubin Urine: NEGATIVE
Hgb urine dipstick: NEGATIVE
Ketones, ur: NEGATIVE mg/dL
Leukocytes, UA: NEGATIVE
Nitrite: NEGATIVE
Protein, ur: NEGATIVE mg/dL
Specific Gravity, Urine: <=1.005 (ref 1.005–1.030)
pH: 6.5 (ref 5.0–8.0)

## 2012-12-21 NOTE — Progress Notes (Signed)
Doing well.  Good fetal movement, denies vaginal bleeding, LOF, regular contractions. Reviewed BS Log (Fasting 91-96; PP 3 out of 20 over 130, 3 additional at 125-127).  Discussed meals when sugars are higher, recommend dietary changes, walking every day.

## 2012-12-21 NOTE — Progress Notes (Signed)
Pulse: 92

## 2012-12-28 ENCOUNTER — Ambulatory Visit (INDEPENDENT_AMBULATORY_CARE_PROVIDER_SITE_OTHER): Payer: Medicaid Other | Admitting: Family

## 2012-12-28 VITALS — BP 105/70 | Temp 97.8°F | Wt 154.6 lb

## 2012-12-28 DIAGNOSIS — O24419 Gestational diabetes mellitus in pregnancy, unspecified control: Secondary | ICD-10-CM

## 2012-12-28 DIAGNOSIS — O99810 Abnormal glucose complicating pregnancy: Secondary | ICD-10-CM

## 2012-12-28 LAB — POCT URINALYSIS DIP (DEVICE)
Hgb urine dipstick: NEGATIVE
Ketones, ur: NEGATIVE mg/dL
Leukocytes, UA: NEGATIVE
Protein, ur: NEGATIVE mg/dL
Specific Gravity, Urine: <=1.005 (ref 1.005–1.030)
pH: 7 (ref 5.0–8.0)

## 2012-12-28 NOTE — Progress Notes (Signed)
FBS 90-91, BFT 102-125 (1/7) Lunch 110-130 (1/7) D 109-125 (2/7).

## 2012-12-28 NOTE — Progress Notes (Signed)
Pulse: 90

## 2013-01-11 ENCOUNTER — Ambulatory Visit (INDEPENDENT_AMBULATORY_CARE_PROVIDER_SITE_OTHER): Payer: Medicaid Other | Admitting: Obstetrics & Gynecology

## 2013-01-11 ENCOUNTER — Encounter: Payer: Medicaid Other | Attending: Obstetrics and Gynecology | Admitting: *Deleted

## 2013-01-11 VITALS — BP 106/69 | Temp 97.8°F | Wt 156.8 lb

## 2013-01-11 DIAGNOSIS — O99810 Abnormal glucose complicating pregnancy: Secondary | ICD-10-CM

## 2013-01-11 DIAGNOSIS — O0993 Supervision of high risk pregnancy, unspecified, third trimester: Secondary | ICD-10-CM

## 2013-01-11 DIAGNOSIS — O24419 Gestational diabetes mellitus in pregnancy, unspecified control: Secondary | ICD-10-CM

## 2013-01-11 DIAGNOSIS — Z713 Dietary counseling and surveillance: Secondary | ICD-10-CM | POA: Insufficient documentation

## 2013-01-11 LAB — CBC
HCT: 35.5 % — ABNORMAL LOW (ref 36.0–46.0)
Hemoglobin: 12 g/dL (ref 12.0–15.0)
MCH: 24.4 pg — ABNORMAL LOW (ref 26.0–34.0)
MCHC: 33.8 g/dL (ref 30.0–36.0)
Platelets: 183 10*3/uL (ref 150–400)
RBC: 4.91 MIL/uL (ref 3.87–5.11)

## 2013-01-11 LAB — POCT URINALYSIS DIP (DEVICE)
Hgb urine dipstick: NEGATIVE
Ketones, ur: NEGATIVE mg/dL
Protein, ur: NEGATIVE mg/dL
Specific Gravity, Urine: 1.015 (ref 1.005–1.030)
pH: 7 (ref 5.0–8.0)

## 2013-01-11 LAB — GLUCOSE, CAPILLARY: Glucose-Capillary: 96 mg/dL (ref 70–99)

## 2013-01-11 MED ORDER — GLUCOSE BLOOD VI STRP: ORAL_STRIP | Status: DC

## 2013-01-11 MED ORDER — ACCU-CHEK FASTCLIX LANCETS MISC: 1.0000 | Freq: Four times a day (QID) | Status: DC

## 2013-01-11 NOTE — Progress Notes (Signed)
Fasting 98,98,87,89,98,92  2 hr br 120,110,120,100,110,108,110,  2 hr lu 125,109,125,120,125,115,120;  2 hr dinner 110,135,110,130,125,130,132 Pt is not eating a bedtime snack.  Will refer to dietician.  Fastings are starting to get high.  Will watch closely.  If continues, will increase evening glyburide.   Fastign CBG here is 96  Fetal Echo is nml.  Will be scanned under media.

## 2013-01-11 NOTE — Progress Notes (Signed)
P = 91 

## 2013-01-11 NOTE — Progress Notes (Signed)
DIABETES: FBS gradual increase. Review need for CHO and protein snack at HS. Verbalized understanding through interpreter.

## 2013-01-11 NOTE — Addendum Note (Signed)
Addended by: Franchot Mimes on: 01/11/2013 10:45 AM   Modules accepted: Orders

## 2013-01-25 ENCOUNTER — Encounter: Payer: Self-pay | Admitting: Obstetrics and Gynecology

## 2013-01-25 ENCOUNTER — Ambulatory Visit (INDEPENDENT_AMBULATORY_CARE_PROVIDER_SITE_OTHER): Payer: Medicaid Other | Admitting: Obstetrics and Gynecology

## 2013-01-25 VITALS — BP 103/67 | Temp 97.7°F | Wt 158.7 lb

## 2013-01-25 DIAGNOSIS — O24419 Gestational diabetes mellitus in pregnancy, unspecified control: Secondary | ICD-10-CM

## 2013-01-25 DIAGNOSIS — O99810 Abnormal glucose complicating pregnancy: Secondary | ICD-10-CM

## 2013-01-25 DIAGNOSIS — O0993 Supervision of high risk pregnancy, unspecified, third trimester: Secondary | ICD-10-CM

## 2013-01-25 DIAGNOSIS — Z789 Other specified health status: Secondary | ICD-10-CM

## 2013-01-25 DIAGNOSIS — Z609 Problem related to social environment, unspecified: Secondary | ICD-10-CM

## 2013-01-25 LAB — POCT URINALYSIS DIP (DEVICE)
Glucose, UA: NEGATIVE mg/dL
Hgb urine dipstick: NEGATIVE
Ketones, ur: NEGATIVE mg/dL
Nitrite: NEGATIVE
Specific Gravity, Urine: 1.01 (ref 1.005–1.030)
Urobilinogen, UA: 0.2 mg/dL (ref 0.0–1.0)
pH: 7 (ref 5.0–8.0)

## 2013-01-25 NOTE — Progress Notes (Signed)
Pulse: 91

## 2013-01-25 NOTE — Progress Notes (Signed)
Patient doing well without complaints. FM/PTL precautions reviewed. CBGs majority within range 2-3 pp of 135 after lunch and dinner. No change in meds. Will start twice weekly fetal testing at next visit

## 2013-01-28 NOTE — L&D Delivery Note (Signed)
Delivery Note At 3:30 AM a healthy female was delivered via  (Presentation:ROA ).  APGAR: 8,9; weight to be weighed .   Placenta status: spontaneous, .  Cord: 3 vessel  with the following complications: None.  Cord pH: n/a  Anesthesia: Episdural   Episiotomy: none Lacerations: none Suture Repair: none Est. Blood Loss (mL): 300  Mom to postpartum.  Baby to Nursery.  Kevin FentonBradshaw, Samuel 03/17/2013, 3:45 AM  Called to room for delivery. Pt pushed over intact perineum successfully. Active management of 3rd stage with pit and traction delivered intact placenta with 3v cord. EBL 300, counts correct, hemostatic.  I spoke with and was present for the entire delivery and evaluation for this patient. Agree with above. Tawana ScaleMichael Ryan Claudell Rhody, MD OB Fellow 03/17/2013 4:13 AM

## 2013-02-01 ENCOUNTER — Encounter: Payer: Self-pay | Admitting: *Deleted

## 2013-02-08 ENCOUNTER — Ambulatory Visit (INDEPENDENT_AMBULATORY_CARE_PROVIDER_SITE_OTHER): Payer: Medicaid Other | Admitting: Obstetrics & Gynecology

## 2013-02-08 VITALS — BP 124/77 | Temp 98.7°F | Wt 160.2 lb

## 2013-02-08 DIAGNOSIS — O99810 Abnormal glucose complicating pregnancy: Secondary | ICD-10-CM

## 2013-02-08 DIAGNOSIS — O24419 Gestational diabetes mellitus in pregnancy, unspecified control: Secondary | ICD-10-CM

## 2013-02-08 LAB — POCT URINALYSIS DIP (DEVICE)
Bilirubin Urine: NEGATIVE
GLUCOSE, UA: NEGATIVE mg/dL
Hgb urine dipstick: NEGATIVE
Nitrite: NEGATIVE
Protein, ur: NEGATIVE mg/dL
Specific Gravity, Urine: 1.02 (ref 1.005–1.030)
UROBILINOGEN UA: 0.2 mg/dL (ref 0.0–1.0)
pH: 6 (ref 5.0–8.0)

## 2013-02-08 NOTE — Progress Notes (Signed)
FBS 90's, PP <140, continue glyburide 2.5 mg at night. NST reactive

## 2013-02-08 NOTE — Progress Notes (Signed)
Pulse: 99 

## 2013-02-08 NOTE — Patient Instructions (Addendum)
Third Trimester of Pregnancy  The third trimester is from week 29 through week 42, months 7 through 9. The third trimester is a time when the fetus is growing rapidly. At the end of the ninth month, the fetus is about 20 inches in length and weighs 6 10 pounds.   BODY CHANGES  Your body goes through many changes during pregnancy. The changes vary from woman to woman.    Your weight will continue to increase. You can expect to gain 25 35 pounds (11 16 kg) by the end of the pregnancy.   You may begin to get stretch marks on your hips, abdomen, and breasts.   You may urinate more often because the fetus is moving lower into your pelvis and pressing on your bladder.   You may develop or continue to have heartburn as a result of your pregnancy.   You may develop constipation because certain hormones are causing the muscles that push waste through your intestines to slow down.   You may develop hemorrhoids or swollen, bulging veins (varicose veins).   You may have pelvic pain because of the weight gain and pregnancy hormones relaxing your joints between the bones in your pelvis. Back aches may result from over exertion of the muscles supporting your posture.   Your breasts will continue to grow and be tender. A yellow discharge may leak from your breasts called colostrum.   Your belly button may stick out.   You may feel short of breath because of your expanding uterus.   You may notice the fetus "dropping," or moving lower in your abdomen.   You may have a bloody mucus discharge. This usually occurs a few days to a week before labor begins.   Your cervix becomes thin and soft (effaced) near your due date.  WHAT TO EXPECT AT YOUR PRENATAL EXAMS   You will have prenatal exams every 2 weeks until week 36. Then, you will have weekly prenatal exams. During a routine prenatal visit:   You will be weighed to make sure you and the fetus are growing normally.   Your blood pressure is taken.   Your abdomen will be  measured to track your baby's growth.   The fetal heartbeat will be listened to.   Any test results from the previous visit will be discussed.   You may have a cervical check near your due date to see if you have effaced.  At around 36 weeks, your caregiver will check your cervix. At the same time, your caregiver will also perform a test on the secretions of the vaginal tissue. This test is to determine if a type of bacteria, Group B streptococcus, is present. Your caregiver will explain this further.  Your caregiver may ask you:   What your birth plan is.   How you are feeling.   If you are feeling the baby move.   If you have had any abnormal symptoms, such as leaking fluid, bleeding, severe headaches, or abdominal cramping.   If you have any questions.  Other tests or screenings that may be performed during your third trimester include:   Blood tests that check for low iron levels (anemia).   Fetal testing to check the health, activity level, and growth of the fetus. Testing is done if you have certain medical conditions or if there are problems during the pregnancy.  FALSE LABOR  You may feel small, irregular contractions that eventually go away. These are called Braxton Hicks contractions, or   false labor. Contractions may last for hours, days, or even weeks before true labor sets in. If contractions come at regular intervals, intensify, or become painful, it is best to be seen by your caregiver.   SIGNS OF LABOR    Menstrual-like cramps.   Contractions that are 5 minutes apart or less.   Contractions that start on the top of the uterus and spread down to the lower abdomen and back.   A sense of increased pelvic pressure or back pain.   A watery or bloody mucus discharge that comes from the vagina.  If you have any of these signs before the 37th week of pregnancy, call your caregiver right away. You need to go to the hospital to get checked immediately.  HOME CARE INSTRUCTIONS    Avoid all  smoking, herbs, alcohol, and unprescribed drugs. These chemicals affect the formation and growth of the baby.   Follow your caregiver's instructions regarding medicine use. There are medicines that are either safe or unsafe to take during pregnancy.   Exercise only as directed by your caregiver. Experiencing uterine cramps is a good sign to stop exercising.   Continue to eat regular, healthy meals.   Wear a good support bra for breast tenderness.   Do not use hot tubs, steam rooms, or saunas.   Wear your seat belt at all times when driving.   Avoid raw meat, uncooked cheese, cat litter boxes, and soil used by cats. These carry germs that can cause birth defects in the baby.   Take your prenatal vitamins.   Try taking a stool softener (if your caregiver approves) if you develop constipation. Eat more high-fiber foods, such as fresh vegetables or fruit and whole grains. Drink plenty of fluids to keep your urine clear or pale yellow.   Take warm sitz baths to soothe any pain or discomfort caused by hemorrhoids. Use hemorrhoid cream if your caregiver approves.   If you develop varicose veins, wear support hose. Elevate your feet for 15 minutes, 3 4 times a day. Limit salt in your diet.   Avoid heavy lifting, wear low heal shoes, and practice good posture.   Rest a lot with your legs elevated if you have leg cramps or low back pain.   Visit your dentist if you have not gone during your pregnancy. Use a soft toothbrush to brush your teeth and be gentle when you floss.   A sexual relationship may be continued unless your caregiver directs you otherwise.   Do not travel far distances unless it is absolutely necessary and only with the approval of your caregiver.   Take prenatal classes to understand, practice, and ask questions about the labor and delivery.   Make a trial run to the hospital.   Pack your hospital bag.   Prepare the baby's nursery.   Continue to go to all your prenatal visits as directed  by your caregiver.  SEEK MEDICAL CARE IF:   You are unsure if you are in labor or if your water has broken.   You have dizziness.   You have mild pelvic cramps, pelvic pressure, or nagging pain in your abdominal area.   You have persistent nausea, vomiting, or diarrhea.   You have a bad smelling vaginal discharge.   You have pain with urination.  SEEK IMMEDIATE MEDICAL CARE IF:    You have a fever.   You are leaking fluid from your vagina.   You have spotting or bleeding from your vagina.     You have severe abdominal cramping or pain.   You have rapid weight loss or gain.   You have shortness of breath with chest pain.   You notice sudden or extreme swelling of your face, hands, ankles, feet, or legs.   You have not felt your baby move in over an hour.   You have severe headaches that do not go away with medicine.   You have vision changes.  Document Released: 01/08/2001 Document Revised: 09/16/2012 Document Reviewed: 03/17/2012  [employer] Patient Information 2014 [employer]

## 2013-02-11 ENCOUNTER — Ambulatory Visit (INDEPENDENT_AMBULATORY_CARE_PROVIDER_SITE_OTHER): Payer: Medicaid Other | Admitting: *Deleted

## 2013-02-11 VITALS — BP 117/65

## 2013-02-11 DIAGNOSIS — O24419 Gestational diabetes mellitus in pregnancy, unspecified control: Secondary | ICD-10-CM

## 2013-02-11 DIAGNOSIS — O99810 Abnormal glucose complicating pregnancy: Secondary | ICD-10-CM

## 2013-02-11 LAB — US OB FOLLOW UP

## 2013-02-11 NOTE — Progress Notes (Signed)
P-101 

## 2013-02-15 ENCOUNTER — Encounter: Payer: Self-pay | Admitting: Obstetrics and Gynecology

## 2013-02-15 ENCOUNTER — Ambulatory Visit (INDEPENDENT_AMBULATORY_CARE_PROVIDER_SITE_OTHER): Payer: Medicaid Other | Admitting: Obstetrics and Gynecology

## 2013-02-15 VITALS — BP 115/68 | Temp 97.3°F | Wt 162.1 lb

## 2013-02-15 DIAGNOSIS — O24419 Gestational diabetes mellitus in pregnancy, unspecified control: Secondary | ICD-10-CM

## 2013-02-15 DIAGNOSIS — Z609 Problem related to social environment, unspecified: Secondary | ICD-10-CM

## 2013-02-15 DIAGNOSIS — O0990 Supervision of high risk pregnancy, unspecified, unspecified trimester: Secondary | ICD-10-CM

## 2013-02-15 DIAGNOSIS — Z789 Other specified health status: Secondary | ICD-10-CM

## 2013-02-15 DIAGNOSIS — O99810 Abnormal glucose complicating pregnancy: Secondary | ICD-10-CM

## 2013-02-15 LAB — POCT URINALYSIS DIP (DEVICE)
Bilirubin Urine: NEGATIVE
Glucose, UA: NEGATIVE mg/dL
HGB URINE DIPSTICK: NEGATIVE
Ketones, ur: NEGATIVE mg/dL
NITRITE: NEGATIVE
Protein, ur: NEGATIVE mg/dL
Specific Gravity, Urine: 1.015 (ref 1.005–1.030)
Urobilinogen, UA: 0.2 mg/dL (ref 0.0–1.0)
pH: 7 (ref 5.0–8.0)

## 2013-02-15 MED ORDER — GLYBURIDE 2.5 MG PO TABS: ORAL_TABLET | ORAL | Status: DC

## 2013-02-15 NOTE — Progress Notes (Signed)
Patient is doing well without complaints. FM/PTL precautions reviewed. CBGs reviewed fasting: highest 97 most in 90's and 2hr pp 122-140. Will change glyburide regimen to glyburide 2.5 mg in am and 5 mg at bedtime. F/u growth ultrasound ordered NST reviewed and reactive

## 2013-02-15 NOTE — Progress Notes (Signed)
Pulse- 96 

## 2013-02-18 ENCOUNTER — Ambulatory Visit (HOSPITAL_COMMUNITY)
Admission: RE | Admit: 2013-02-18 | Discharge: 2013-02-18 | Disposition: A | Discharge status: Home / Self Care | Payer: Medicaid Other | Source: Ambulatory Visit | Attending: Obstetrics and Gynecology | Admitting: Obstetrics and Gynecology

## 2013-02-18 ENCOUNTER — Ambulatory Visit (INDEPENDENT_AMBULATORY_CARE_PROVIDER_SITE_OTHER): Payer: Medicaid Other | Admitting: *Deleted

## 2013-02-18 VITALS — BP 118/69

## 2013-02-18 DIAGNOSIS — Z3689 Encounter for other specified antenatal screening: Secondary | ICD-10-CM | POA: Insufficient documentation

## 2013-02-18 DIAGNOSIS — O0990 Supervision of high risk pregnancy, unspecified, unspecified trimester: Secondary | ICD-10-CM

## 2013-02-18 DIAGNOSIS — O99810 Abnormal glucose complicating pregnancy: Secondary | ICD-10-CM

## 2013-02-18 DIAGNOSIS — O09299 Supervision of pregnancy with other poor reproductive or obstetric history, unspecified trimester: Secondary | ICD-10-CM | POA: Insufficient documentation

## 2013-02-18 DIAGNOSIS — O24419 Gestational diabetes mellitus in pregnancy, unspecified control: Secondary | ICD-10-CM

## 2013-02-18 NOTE — Progress Notes (Signed)
P = 86   Pt had US for growth today

## 2013-02-22 ENCOUNTER — Ambulatory Visit (INDEPENDENT_AMBULATORY_CARE_PROVIDER_SITE_OTHER): Payer: Medicaid Other | Admitting: Obstetrics and Gynecology

## 2013-02-22 VITALS — BP 109/69 | Temp 97.1°F | Wt 160.0 lb

## 2013-02-22 DIAGNOSIS — O24419 Gestational diabetes mellitus in pregnancy, unspecified control: Secondary | ICD-10-CM

## 2013-02-22 DIAGNOSIS — O99810 Abnormal glucose complicating pregnancy: Secondary | ICD-10-CM

## 2013-02-22 LAB — POCT URINALYSIS DIP (DEVICE)
BILIRUBIN URINE: NEGATIVE
Glucose, UA: NEGATIVE mg/dL
Hgb urine dipstick: NEGATIVE
Ketones, ur: NEGATIVE mg/dL
Nitrite: NEGATIVE
PH: 7 (ref 5.0–8.0)
Protein, ur: NEGATIVE mg/dL
Specific Gravity, Urine: 1.015 (ref 1.005–1.030)
UROBILINOGEN UA: 0.2 mg/dL (ref 0.0–1.0)

## 2013-02-22 MED ORDER — GLYBURIDE 2.5 MG PO TABS: ORAL_TABLET | ORAL | Status: DC

## 2013-02-22 NOTE — Progress Notes (Signed)
A2DM: On glyburide 2.5mg  q am and 5 mg q pm. F 90-98 but 6/7>95. PPB 2/7>120, PPL 2/7 sl elevated, PPD 5/7 elevated> increase to 5mg  am and pm.  Growth scan scheduled and 2x/wk testing.

## 2013-02-22 NOTE — Progress Notes (Signed)
US for growth done 02/18/13

## 2013-02-22 NOTE — Progress Notes (Signed)
p=102 

## 2013-02-22 NOTE — Patient Instructions (Signed)

## 2013-02-25 ENCOUNTER — Ambulatory Visit (INDEPENDENT_AMBULATORY_CARE_PROVIDER_SITE_OTHER): Payer: Medicaid Other | Admitting: *Deleted

## 2013-02-25 VITALS — BP 121/71

## 2013-02-25 DIAGNOSIS — O24419 Gestational diabetes mellitus in pregnancy, unspecified control: Secondary | ICD-10-CM

## 2013-02-25 DIAGNOSIS — O99810 Abnormal glucose complicating pregnancy: Secondary | ICD-10-CM

## 2013-02-25 LAB — US OB FOLLOW UP

## 2013-02-25 NOTE — Progress Notes (Signed)
P=79 

## 2013-02-26 NOTE — Progress Notes (Signed)
NST Reactive on 02/26/13  

## 2013-03-01 ENCOUNTER — Ambulatory Visit (INDEPENDENT_AMBULATORY_CARE_PROVIDER_SITE_OTHER): Payer: Medicaid Other | Admitting: Obstetrics & Gynecology

## 2013-03-01 VITALS — BP 122/69 | Wt 167.7 lb

## 2013-03-01 DIAGNOSIS — O24419 Gestational diabetes mellitus in pregnancy, unspecified control: Secondary | ICD-10-CM

## 2013-03-01 DIAGNOSIS — O99810 Abnormal glucose complicating pregnancy: Secondary | ICD-10-CM

## 2013-03-01 LAB — FETAL NONSTRESS TEST

## 2013-03-01 LAB — POCT URINALYSIS DIP (DEVICE)
BILIRUBIN URINE: NEGATIVE
Glucose, UA: NEGATIVE mg/dL
Hgb urine dipstick: NEGATIVE
KETONES UR: NEGATIVE mg/dL
Nitrite: NEGATIVE
Protein, ur: NEGATIVE mg/dL
Specific Gravity, Urine: 1.01 (ref 1.005–1.030)
Urobilinogen, UA: 0.2 mg/dL (ref 0.0–1.0)
pH: 6 (ref 5.0–8.0)

## 2013-03-01 NOTE — Patient Instructions (Signed)
Return to clinic for any obstetric concerns or go to MAU for evaluation  

## 2013-03-01 NOTE — Progress Notes (Signed)
P-82 

## 2013-03-01 NOTE — Progress Notes (Signed)
[place] interpreter present.  CBGs reviewed, within range, continue Glyburide 2.5 mg/5mg  NST performed today was reviewed and was found to be reactive.  Continue recommended antenatal testing and prenatal care. No other complaints or concerns.  Fetal movement and labor precautions reviewed. Pelvic cultures next week.

## 2013-03-04 ENCOUNTER — Ambulatory Visit (INDEPENDENT_AMBULATORY_CARE_PROVIDER_SITE_OTHER): Payer: Medicaid Other | Admitting: *Deleted

## 2013-03-04 ENCOUNTER — Other Ambulatory Visit: Payer: Medicaid Other

## 2013-03-04 VITALS — BP 134/82

## 2013-03-04 DIAGNOSIS — O99810 Abnormal glucose complicating pregnancy: Secondary | ICD-10-CM

## 2013-03-04 DIAGNOSIS — O24419 Gestational diabetes mellitus in pregnancy, unspecified control: Secondary | ICD-10-CM

## 2013-03-04 LAB — US OB FOLLOW UP

## 2013-03-04 NOTE — Progress Notes (Signed)
P = 77 

## 2013-03-06 NOTE — Progress Notes (Signed)
NST 02/25/13 reactive 

## 2013-03-08 ENCOUNTER — Ambulatory Visit (INDEPENDENT_AMBULATORY_CARE_PROVIDER_SITE_OTHER): Payer: Medicaid Other | Admitting: Obstetrics & Gynecology

## 2013-03-08 ENCOUNTER — Encounter: Payer: Self-pay | Admitting: *Deleted

## 2013-03-08 VITALS — BP 123/73 | Wt 166.5 lb

## 2013-03-08 DIAGNOSIS — O99810 Abnormal glucose complicating pregnancy: Secondary | ICD-10-CM

## 2013-03-08 DIAGNOSIS — O24419 Gestational diabetes mellitus in pregnancy, unspecified control: Secondary | ICD-10-CM

## 2013-03-08 DIAGNOSIS — O0990 Supervision of high risk pregnancy, unspecified, unspecified trimester: Secondary | ICD-10-CM

## 2013-03-08 LAB — POCT URINALYSIS DIP (DEVICE)
Bilirubin Urine: NEGATIVE
Glucose, UA: NEGATIVE mg/dL
Hgb urine dipstick: NEGATIVE
KETONES UR: NEGATIVE mg/dL
Nitrite: NEGATIVE
Protein, ur: NEGATIVE mg/dL
UROBILINOGEN UA: 0.2 mg/dL (ref 0.0–1.0)
pH: 6 (ref 5.0–8.0)

## 2013-03-08 LAB — OB RESULTS CONSOLE GBS: STREP GROUP B AG: NEGATIVE

## 2013-03-08 NOTE — Progress Notes (Signed)
U/S scheduled 03/22/13 at 845 am.

## 2013-03-08 NOTE — Patient Instructions (Signed)
Third Trimester of Pregnancy  The third trimester is from week 29 through week 42, months 7 through 9. The third trimester is a time when the fetus is growing rapidly. At the end of the ninth month, the fetus is about 20 inches in length and weighs 6 10 pounds.   BODY CHANGES  Your body goes through many changes during pregnancy. The changes vary from woman to woman.    Your weight will continue to increase. You can expect to gain 25 35 pounds (11 16 kg) by the end of the pregnancy.   You may begin to get stretch marks on your hips, abdomen, and breasts.   You may urinate more often because the fetus is moving lower into your pelvis and pressing on your bladder.   You may develop or continue to have heartburn as a result of your pregnancy.   You may develop constipation because certain hormones are causing the muscles that push waste through your intestines to slow down.   You may develop hemorrhoids or swollen, bulging veins (varicose veins).   You may have pelvic pain because of the weight gain and pregnancy hormones relaxing your joints between the bones in your pelvis. Back aches may result from over exertion of the muscles supporting your posture.   Your breasts will continue to grow and be tender. A yellow discharge may leak from your breasts called colostrum.   Your belly button may stick out.   You may feel short of breath because of your expanding uterus.   You may notice the fetus "dropping," or moving lower in your abdomen.   You may have a bloody mucus discharge. This usually occurs a few days to a week before labor begins.   Your cervix becomes thin and soft (effaced) near your due date.  WHAT TO EXPECT AT YOUR PRENATAL EXAMS   You will have prenatal exams every 2 weeks until week 36. Then, you will have weekly prenatal exams. During a routine prenatal visit:   You will be weighed to make sure you and the fetus are growing normally.   Your blood pressure is taken.   Your abdomen will be  measured to track your baby's growth.   The fetal heartbeat will be listened to.   Any test results from the previous visit will be discussed.   You may have a cervical check near your due date to see if you have effaced.  At around 36 weeks, your caregiver will check your cervix. At the same time, your caregiver will also perform a test on the secretions of the vaginal tissue. This test is to determine if a type of bacteria, Group B streptococcus, is present. Your caregiver will explain this further.  Your caregiver may ask you:   What your birth plan is.   How you are feeling.   If you are feeling the baby move.   If you have had any abnormal symptoms, such as leaking fluid, bleeding, severe headaches, or abdominal cramping.   If you have any questions.  Other tests or screenings that may be performed during your third trimester include:   Blood tests that check for low iron levels (anemia).   Fetal testing to check the health, activity level, and growth of the fetus. Testing is done if you have certain medical conditions or if there are problems during the pregnancy.  FALSE LABOR  You may feel small, irregular contractions that eventually go away. These are called Braxton Hicks contractions, or   false labor. Contractions may last for hours, days, or even weeks before true labor sets in. If contractions come at regular intervals, intensify, or become painful, it is best to be seen by your caregiver.   SIGNS OF LABOR    Menstrual-like cramps.   Contractions that are 5 minutes apart or less.   Contractions that start on the top of the uterus and spread down to the lower abdomen and back.   A sense of increased pelvic pressure or back pain.   A watery or bloody mucus discharge that comes from the vagina.  If you have any of these signs before the 37th week of pregnancy, call your caregiver right away. You need to go to the hospital to get checked immediately.  HOME CARE INSTRUCTIONS    Avoid all  smoking, herbs, alcohol, and unprescribed drugs. These chemicals affect the formation and growth of the baby.   Follow your caregiver's instructions regarding medicine use. There are medicines that are either safe or unsafe to take during pregnancy.   Exercise only as directed by your caregiver. Experiencing uterine cramps is a good sign to stop exercising.   Continue to eat regular, healthy meals.   Wear a good support bra for breast tenderness.   Do not use hot tubs, steam rooms, or saunas.   Wear your seat belt at all times when driving.   Avoid raw meat, uncooked cheese, cat litter boxes, and soil used by cats. These carry germs that can cause birth defects in the baby.   Take your prenatal vitamins.   Try taking a stool softener (if your caregiver approves) if you develop constipation. Eat more high-fiber foods, such as fresh vegetables or fruit and whole grains. Drink plenty of fluids to keep your urine clear or pale yellow.   Take warm sitz baths to soothe any pain or discomfort caused by hemorrhoids. Use hemorrhoid cream if your caregiver approves.   If you develop varicose veins, wear support hose. Elevate your feet for 15 minutes, 3 4 times a day. Limit salt in your diet.   Avoid heavy lifting, wear low heal shoes, and practice good posture.   Rest a lot with your legs elevated if you have leg cramps or low back pain.   Visit your dentist if you have not gone during your pregnancy. Use a soft toothbrush to brush your teeth and be gentle when you floss.   A sexual relationship may be continued unless your caregiver directs you otherwise.   Do not travel far distances unless it is absolutely necessary and only with the approval of your caregiver.   Take prenatal classes to understand, practice, and ask questions about the labor and delivery.   Make a trial run to the hospital.   Pack your hospital bag.   Prepare the baby's nursery.   Continue to go to all your prenatal visits as directed  by your caregiver.  SEEK MEDICAL CARE IF:   You are unsure if you are in labor or if your water has broken.   You have dizziness.   You have mild pelvic cramps, pelvic pressure, or nagging pain in your abdominal area.   You have persistent nausea, vomiting, or diarrhea.   You have a bad smelling vaginal discharge.   You have pain with urination.  SEEK IMMEDIATE MEDICAL CARE IF:    You have a fever.   You are leaking fluid from your vagina.   You have spotting or bleeding from your vagina.     You have severe abdominal cramping or pain.   You have rapid weight loss or gain.   You have shortness of breath with chest pain.   You notice sudden or extreme swelling of your face, hands, ankles, feet, or legs.   You have not felt your baby move in over an hour.   You have severe headaches that do not go away with medicine.   You have vision changes.  Document Released: 01/08/2001 Document Revised: 09/16/2012 Document Reviewed: 03/17/2012  [employer] Patient Information 2014 [employer]

## 2013-03-08 NOTE — Progress Notes (Signed)
FBS<98, pp <135, continue present glyburide. NST reactive. US 38 weeks for growth. Plan induction 39 weeks. GBS, GC CT

## 2013-03-08 NOTE — Progress Notes (Signed)
P=93 

## 2013-03-09 LAB — GC/CHLAMYDIA PROBE AMP
CT Probe RNA: NEGATIVE
GC Probe RNA: NEGATIVE

## 2013-03-10 LAB — CULTURE, BETA STREP (GROUP B ONLY)

## 2013-03-11 ENCOUNTER — Ambulatory Visit (INDEPENDENT_AMBULATORY_CARE_PROVIDER_SITE_OTHER): Payer: Medicaid Other | Admitting: *Deleted

## 2013-03-11 VITALS — BP 124/78

## 2013-03-11 DIAGNOSIS — O24419 Gestational diabetes mellitus in pregnancy, unspecified control: Secondary | ICD-10-CM

## 2013-03-11 DIAGNOSIS — O99810 Abnormal glucose complicating pregnancy: Secondary | ICD-10-CM

## 2013-03-11 LAB — US OB FOLLOW UP

## 2013-03-11 NOTE — Progress Notes (Signed)
P-83 

## 2013-03-11 NOTE — Progress Notes (Signed)
NST performed today was reviewed and was found to be reactive. AFI normal at 14.6 cm. Continue recommended antenatal testing and prenatal care.

## 2013-03-15 ENCOUNTER — Ambulatory Visit (INDEPENDENT_AMBULATORY_CARE_PROVIDER_SITE_OTHER): Payer: Medicaid Other | Admitting: Family Medicine

## 2013-03-15 VITALS — BP 129/75 | Wt 168.6 lb

## 2013-03-15 DIAGNOSIS — O24419 Gestational diabetes mellitus in pregnancy, unspecified control: Secondary | ICD-10-CM

## 2013-03-15 DIAGNOSIS — O3660X0 Maternal care for excessive fetal growth, unspecified trimester, not applicable or unspecified: Secondary | ICD-10-CM

## 2013-03-15 DIAGNOSIS — O99810 Abnormal glucose complicating pregnancy: Secondary | ICD-10-CM

## 2013-03-15 LAB — POCT URINALYSIS DIP (DEVICE)
Bilirubin Urine: NEGATIVE
Glucose, UA: NEGATIVE mg/dL
HGB URINE DIPSTICK: NEGATIVE
KETONES UR: NEGATIVE mg/dL
Nitrite: NEGATIVE
PROTEIN: NEGATIVE mg/dL
Specific Gravity, Urine: <=1.005 (ref 1.005–1.030)
UROBILINOGEN UA: 0.2 mg/dL (ref 0.0–1.0)
pH: 6 (ref 5.0–8.0)

## 2013-03-15 LAB — HEMOGLOBIN A1C
Hgb A1c MFr Bld: 6.3 % — ABNORMAL HIGH (ref <5.7)
MEAN PLASMA GLUCOSE: 134 mg/dL — AB (ref <117)

## 2013-03-15 MED ORDER — GLYBURIDE 5 MG PO TABS: ORAL_TABLET | ORAL | Status: DC

## 2013-03-15 NOTE — Progress Notes (Signed)
IOL scheduled 03/28/13 at 730 am.

## 2013-03-15 NOTE — Patient Instructions (Signed)
Gestational Diabetes Mellitus Gestational diabetes mellitus, often simply referred to as gestational diabetes, is a type of diabetes that some women develop during pregnancy. In gestational diabetes, the pancreas does not make enough insulin (a hormone), the cells are less responsive to the insulin that is made (insulin resistance), or both.Normally, insulin moves sugars from food into the tissue cells. The tissue cells use the sugars for energy. The lack of insulin or the lack of normal response to insulin causes excess sugars to build up in the blood instead of going into the tissue cells. As a result, high blood sugar (hyperglycemia) develops. The effect of high sugar (glucose) levels can cause many complications.  RISK FACTORS You have an increased chance of developing gestational diabetes if you have a family history of diabetes and also have one or more of the following risk factors:  A body mass index over 30 (obesity).  A previous pregnancy with gestational diabetes.  An older age at the time of pregnancy. If blood glucose levels are kept in the normal range during pregnancy, women can have a healthy pregnancy. If your blood glucose levels are not well controlled, there may be risks to you, your unborn baby (fetus), your labor and delivery, or your newborn baby.  SYMPTOMS  If symptoms are experienced, they are much like symptoms you would normally expect during pregnancy. The symptoms of gestational diabetes include:   Increased thirst (polydipsia).  Increased urination (polyuria).  Increased urination during the night (nocturia).  Weight loss. This weight loss may be rapid.  Frequent, recurring infections.  Tiredness (fatigue).  Weakness.  Vision changes, such as blurred vision.  Fruity smell to your breath.  Abdominal pain. DIAGNOSIS Diabetes is diagnosed when blood glucose levels are increased. Your blood glucose level may be checked by one or more of the following  blood tests:  A fasting blood glucose test. You will not be allowed to eat for at least 8 hours before a blood sample is taken.  A random blood glucose test. Your blood glucose is checked at any time of the day regardless of when you ate.  A hemoglobin A1c blood glucose test. A hemoglobin A1c test provides information about blood glucose control over the previous 3 months.  An oral glucose tolerance test (OGTT). Your blood glucose is measured after you have not eaten (fasted) for 1 3 hours and then after you drink a glucose-containing beverage. Since the hormones that cause insulin resistance are highest at about 24 28 weeks of a pregnancy, an OGTT is usually performed during that time. If you have risk factors for gestational diabetes, your caregiver may test you for gestational diabetes earlier than 24 weeks of pregnancy. TREATMENT   You will need to take diabetes medicine or insulin daily to keep blood glucose levels in the desired range.  You will need to match insulin dosing with exercise and healthy food choices. The treatment goal is to maintain the before meal (preprandial), bedtime, and overnight blood glucose level at 60 99 mg/dL during pregnancy. The treatment goal is to further maintain peak after meal blood sugar (postprandial glucose) level at 100 140 mg/dL. HOME CARE INSTRUCTIONS   Have your hemoglobin A1c level checked twice a year.  Perform daily blood glucose monitoring as directed by your caregiver. It is common to perform frequent blood glucose monitoring.  Monitor urine ketones when you are ill and as directed by your caregiver.  Take your diabetes medicine and insulin as directed by your caregiver to maintain   your blood glucose level in the desired range.  Never run out of diabetes medicine or insulin. It is needed every day.  Adjust insulin based on your intake of carbohydrates. Carbohydrates can raise blood glucose levels but need to be included in your diet.  Carbohydrates provide vitamins, minerals, and fiber which are an essential part of a healthy diet. Carbohydrates are found in fruits, vegetables, whole grains, dairy products, legumes, and foods containing added sugars.    Eat healthy foods. Alternate 3 meals with 3 snacks.  Maintain a healthy weight gain. The usual total expected weight gain varies according to your prepregnancy body mass index (BMI).  Carry a medical alert card or wear your medical alert jewelry.  Carry a 15 gram carbohydrate snack with you at all times to treat low blood glucose (hypoglycemia). Some examples of 15 gram carbohydrate snacks include:  Glucose tablets, 3 or 4   Glucose gel, 15 gram tube  Raisins, 2 tablespoons (24 g)  Jelly beans, 6  Animal crackers, 8  Fruit juice, regular soda, or low fat milk, 4 ounces (120 mL)  Gummy treats, 9    Recognize hypoglycemia. Hypoglycemia during pregnancy occurs with blood glucose levels of 60 mg/dL and below. The risk for hypoglycemia increases when fasting or skipping meals, during or after intense exercise, and during sleep. Hypoglycemia symptoms can include:  Tremors or shakes.  Decreased ability to concentrate.  Sweating.  Increased heart rate.  Headache.  Dry mouth.  Hunger.  Irritability.  Anxiety.  Restless sleep.  Altered speech or coordination.  Confusion.  Treat hypoglycemia promptly. If you are alert and able to safely swallow, follow the 15:15 rule:  Take 15 20 grams of rapid-acting glucose or carbohydrate. Rapid-acting options include glucose gel, glucose tablets, or 4 ounces (120 mL) of fruit juice, regular soda, or low fat milk.  Check your blood glucose level 15 minutes after taking the glucose.   Take 15 20 grams more of glucose if the repeat blood glucose level is still 70 mg/dL or below.  Eat a meal or snack within 1 hour once blood glucose levels return to normal.  Be alert to polyuria and polydipsia which are early  signs of hyperglycemia. An early awareness of hyperglycemia allows for prompt treatment. Treat hyperglycemia as directed by your caregiver.  Engage in at least 30 minutes of physical activity a day or as directed by your caregiver. Ten minutes of physical activity timed 30 minutes after each meal is encouraged to control postprandial blood glucose levels.  Adjust your insulin dosing and food intake as needed if you start a new exercise or sport.  Follow your sick day plan at any time you are unable to eat or drink as usual.  Avoid tobacco and alcohol use.  Follow up with your caregiver regularly.  Follow the advice of your caregiver regarding your prenatal and post-delivery (postpartum) appointments, meal planning, exercise, medicines, vitamins, blood tests, other medical tests, and physical activities.  Perform daily skin and foot care. Examine your skin and feet daily for cuts, bruises, redness, nail problems, bleeding, blisters, or sores.  Brush your teeth and gums at least twice a day and floss at least once a day. Follow up with your dentist regularly.  Schedule an eye exam during the first trimester of your pregnancy or as directed by your caregiver.  Share your diabetes management plan with your workplace or school.  Stay up-to-date with immunizations.  Learn to manage stress.  Obtain ongoing diabetes education and   support as needed. SEEK MEDICAL CARE IF:   You are unable to eat food or drink fluids for more than 6 hours.  You have nausea and vomiting for more than 6 hours.  You have a blood glucose level of 200 mg/dL and you have ketones in your urine.  There is a change in mental status.  You develop vision problems.  You have a persistent headache.  You have upper abdominal pain or discomfort.  You develop an additional serious illness.  You have diarrhea for more than 6 hours.  You have been sick or have had a fever for a couple of days and are not getting  better. SEEK IMMEDIATE MEDICAL CARE IF:   You have difficulty breathing.  You no longer feel the baby moving.  You are bleeding or have discharge from your vagina.  You start having premature contractions or labor. MAKE SURE YOU:  Understand these instructions.  Will watch your condition.  Will get help right away if you are not doing well or get worse. Document Released: 04/22/2000 Document Revised: 05/11/2012 Document Reviewed: 08/13/2011 [employer] Patient Information 2014 [employer].  Breastfeeding Deciding to breastfeed is one of the best choices you can make for you and your baby. A change in hormones during pregnancy causes your breast tissue to grow and increases the number and size of your milk ducts. These hormones also allow proteins, sugars, and fats from your blood supply to make breast milk in your milk-producing glands. Hormones prevent breast milk from being released before your baby is born as well as prompt milk flow after birth. Once breastfeeding has begun, thoughts of your baby, as well as his or her sucking or crying, can stimulate the release of milk from your milk-producing glands.  BENEFITS OF BREASTFEEDING For Your Baby  Your first milk (colostrum) helps your baby's digestive system function better.   There are antibodies in your milk that help your baby fight off infections.   Your baby has a lower incidence of asthma, allergies, and sudden infant death syndrome.   The nutrients in breast milk are better for your baby than infant formulas and are designed uniquely for your baby's needs.   Breast milk improves your baby's brain development.   Your baby is less likely to develop other conditions, such as childhood obesity, asthma, or type 2 diabetes mellitus.  For You   Breastfeeding helps to create a very special bond between you and your baby.   Breastfeeding is convenient. Breast milk is always available at the correct temperature and  costs nothing.   Breastfeeding helps to burn calories and helps you lose the weight gained during pregnancy.   Breastfeeding makes your uterus contract to its prepregnancy size faster and slows bleeding (lochia) after you give birth.   Breastfeeding helps to lower your risk of developing type 2 diabetes mellitus, osteoporosis, and breast or ovarian cancer later in life. SIGNS THAT YOUR BABY IS HUNGRY Early Signs of Hunger  Increased alertness or activity.  Stretching.  Movement of the head from side to side.  Movement of the head and opening of the mouth when the corner of the mouth or cheek is stroked (rooting).  Increased sucking sounds, smacking lips, cooing, sighing, or squeaking.  Hand-to-mouth movements.  Increased sucking of fingers or hands. Late Signs of Hunger  Fussing.  Intermittent crying. Extreme Signs of Hunger Signs of extreme hunger will require calming and consoling before your baby will be able to breastfeed successfully. Do not   wait for the following signs of extreme hunger to occur before you initiate breastfeeding:   Restlessness.  A loud, strong cry.   Screaming. BREASTFEEDING BASICS Breastfeeding Initiation  Find a comfortable place to sit or lie down, with your neck and back well supported.  Place a pillow or rolled up blanket under your baby to bring him or her to the level of your breast (if you are seated). Nursing pillows are specially designed to help support your arms and your baby while you breastfeed.  Make sure that your baby's abdomen is facing your abdomen.   Gently massage your breast. With your fingertips, massage from your chest wall toward your nipple in a circular motion. This encourages milk flow. You may need to continue this action during the feeding if your milk flows slowly.  Support your breast with 4 fingers underneath and your thumb above your nipple. Make sure your fingers are well away from your nipple and your  baby's mouth.   Stroke your baby's lips gently with your finger or nipple.   When your baby's mouth is open wide enough, quickly bring your baby to your breast, placing your entire nipple and as much of the colored area around your nipple (areola) as possible into your baby's mouth.   More areola should be visible above your baby's upper lip than below the lower lip.   Your baby's tongue should be between his or her lower gum and your breast.   Ensure that your baby's mouth is correctly positioned around your nipple (latched). Your baby's lips should create a seal on your breast and be turned out (everted).  It is common for your baby to suck about 2 3 minutes in order to start the flow of breast milk. Latching Teaching your baby how to latch on to your breast properly is very important. An improper latch can cause nipple pain and decreased milk supply for you and poor weight gain in your baby. Also, if your baby is not latched onto your nipple properly, he or she may swallow some air during feeding. This can make your baby fussy. Burping your baby when you switch breasts during the feeding can help to get rid of the air. However, teaching your baby to latch on properly is still the best way to prevent fussiness from swallowing air while breastfeeding. Signs that your baby has successfully latched on to your nipple:    Silent tugging or silent sucking, without causing you pain.   Swallowing heard between every 3 4 sucks.    Muscle movement above and in front of his or her ears while sucking.  Signs that your baby has not successfully latched on to nipple:   Sucking sounds or smacking sounds from your baby while breastfeeding.  Nipple pain. If you think your baby has not latched on correctly, slip your finger into the corner of your baby's mouth to break the suction and place it between your baby's gums. Attempt breastfeeding initiation again. Signs of Successful  Breastfeeding Signs from your baby:   A gradual decrease in the number of sucks or complete cessation of sucking.   Falling asleep.   Relaxation of his or her body.   Retention of a small amount of milk in his or her mouth.   Letting go of your breast by himself or herself. Signs from you:  Breasts that have increased in firmness, weight, and size 1 3 hours after feeding.   Breasts that are softer immediately after breastfeeding.    Increased milk volume, as well as a change in milk consistency and color by the 5th day of breastfeeding.   Nipples that are not sore, cracked, or bleeding. Signs That Your Baby is Getting Enough Milk  Wetting at least 3 diapers in a 24-hour period. The urine should be clear and pale yellow by age 5 days.  At least 3 stools in a 24-hour period by age 5 days. The stool should be soft and yellow.  At least 3 stools in a 24-hour period by age 7 days. The stool should be seedy and yellow.  No loss of weight greater than 10% of birth weight during the first 3 days of age.  Average weight gain of 4 7 ounces (120 210 mL) per week after age 4 days.  Consistent daily weight gain by age 5 days, without weight loss after the age of 2 weeks. After a feeding, your baby may spit up a small amount. This is common. BREASTFEEDING FREQUENCY AND DURATION Frequent feeding will help you make more milk and can prevent sore nipples and breast engorgement. Breastfeed when you feel the need to reduce the fullness of your breasts or when your baby shows signs of hunger. This is called "breastfeeding on demand." Avoid introducing a pacifier to your baby while you are working to establish breastfeeding (the first 4 6 weeks after your baby is born). After this time you may choose to use a pacifier. Research has shown that pacifier use during the first year of a baby's life decreases the risk of sudden infant death syndrome (SIDS). Allow your baby to feed on each breast as  long as he or she wants. Breastfeed until your baby is finished feeding. When your baby unlatches or falls asleep while feeding from the first breast, offer the second breast. Because newborns are often sleepy in the first few weeks of life, you may need to awaken your baby to get him or her to feed. Breastfeeding times will vary from baby to baby. However, the following rules can serve as a guide to help you ensure that your baby is properly fed:  Newborns (babies 4 weeks of age or younger) may breastfeed every 1 3 hours.  Newborns should not go longer than 3 hours during the day or 5 hours during the night without breastfeeding.  You should breastfeed your baby a minimum of 8 times in a 24-hour period until you begin to introduce solid foods to your baby at around 6 months of age. BREAST MILK PUMPING Pumping and storing breast milk allows you to ensure that your baby is exclusively fed your breast milk, even at times when you are unable to breastfeed. This is especially important if you are going back to work while you are still breastfeeding or when you are not able to be present during feedings. Your lactation consultant can give you guidelines on how long it is safe to store breast milk.  A breast pump is a machine that allows you to pump milk from your breast into a sterile bottle. The pumped breast milk can then be stored in a refrigerator or freezer. Some breast pumps are operated by hand, while others use electricity. Ask your lactation consultant which type will work best for you. Breast pumps can be purchased, but some hospitals and breastfeeding support groups lease breast pumps on a monthly basis. A lactation consultant can teach you how to hand express breast milk, if you prefer not to use a pump.  CARING FOR   YOUR BREASTS WHILE YOU BREASTFEED Nipples can become dry, cracked, and sore while breastfeeding. The following recommendations can help keep your breasts moisturized and  healthy:  Avoid using soap on your nipples.   Wear a supportive bra. Although not required, special nursing bras and tank tops are designed to allow access to your breasts for breastfeeding without taking off your entire bra or top. Avoid wearing underwire style bras or extremely tight bras.  Air dry your nipples for 3 4minutes after each feeding.   Use only cotton bra pads to absorb leaked breast milk. Leaking of breast milk between feedings is normal.   Use lanolin on your nipples after breastfeeding. Lanolin helps to maintain your skin's normal moisture barrier. If you use pure lanolin you do not need to wash it off before feeding your baby again. Pure lanolin is not toxic to your baby. You may also hand express a few drops of breast milk and gently massage that milk into your nipples and allow the milk to air dry. In the first few weeks after giving birth, some women experience extremely full breasts (engorgement). Engorgement can make your breasts feel heavy, warm, and tender to the touch. Engorgement peaks within 3 5 days after you give birth. The following recommendations can help ease engorgement:  Completely empty your breasts while breastfeeding or pumping. You may want to start by applying warm, moist heat (in the shower or with warm water-soaked hand towels) just before feeding or pumping. This increases circulation and helps the milk flow. If your baby does not completely empty your breasts while breastfeeding, pump any extra milk after he or she is finished.  Wear a snug bra (nursing or regular) or tank top for 1 2 days to signal your body to slightly decrease milk production.  Apply ice packs to your breasts, unless this is too uncomfortable for you.  Make sure that your baby is latched on and positioned properly while breastfeeding. If engorgement persists after 48 hours of following these recommendations, contact your health care provider or a lactation consultant. OVERALL  HEALTH CARE RECOMMENDATIONS WHILE BREASTFEEDING  Eat healthy foods. Alternate between meals and snacks, eating 3 of each per day. Because what you eat affects your breast milk, some of the foods may make your baby more irritable than usual. Avoid eating these foods if you are sure that they are negatively affecting your baby.  Drink milk, fruit juice, and water to satisfy your thirst (about 10 glasses a day).   Rest often, relax, and continue to take your prenatal vitamins to prevent fatigue, stress, and anemia.  Continue breast self-awareness checks.  Avoid chewing and smoking tobacco.  Avoid alcohol and drug use. Some medicines that may be harmful to your baby can pass through breast milk. It is important to ask your health care provider before taking any medicine, including all over-the-counter and prescription medicine as well as vitamin and herbal supplements. It is possible to become pregnant while breastfeeding. If birth control is desired, ask your health care provider about options that will be safe for your baby. SEEK MEDICAL CARE IF:   You feel like you want to stop breastfeeding or have become frustrated with breastfeeding.  You have painful breasts or nipples.  Your nipples are cracked or bleeding.  Your breasts are red, tender, or warm.  You have a swollen area on either breast.  You have a fever or chills.  You have nausea or vomiting.  You have drainage other than breast   milk from your nipples.  Your breasts do not become full before feedings by the 5th day after you give birth.  You feel sad and depressed.  Your baby is too sleepy to eat well.  Your baby is having trouble sleeping.   Your baby is wetting less than 3 diapers in a 24-hour period.  Your baby has less than 3 stools in a 24-hour period.  Your baby's skin or the white part of his or her eyes becomes yellow.   Your baby is not gaining weight by 5 days of age. SEEK IMMEDIATE MEDICAL CARE  IF:   Your baby is overly tired (lethargic) and does not want to wake up and feed.  Your baby develops an unexplained fever. Document Released: 01/14/2005 Document Revised: 09/16/2012 Document Reviewed: 07/08/2012 [employer] Patient Information 2014 [employer].  

## 2013-03-15 NOTE — Progress Notes (Signed)
Pulse- 81 Patient reports occasional contractions

## 2013-03-15 NOTE — Progress Notes (Signed)
NST reviewed and reactive. FBS 90-98 2 hr pp 110-135 10 of 21 out of range--also almost every number ends in 0 or 5 Check Hgb A1C U/S growth next week. Increase glyburide to 5 mg bid--fone previously but pt. Reports taking only 1 pill 2x/daily May need to discuss delivery plan after u/s next week.

## 2013-03-16 ENCOUNTER — Inpatient Hospital Stay (HOSPITAL_COMMUNITY)
Admission: AD | Admit: 2013-03-16 | Discharge: 2013-03-18 | DRG: 774 | Disposition: A | Discharge status: Home / Self Care | Payer: Medicaid Other | Source: Ambulatory Visit | Attending: Obstetrics & Gynecology | Admitting: Obstetrics & Gynecology

## 2013-03-16 ENCOUNTER — Encounter (HOSPITAL_COMMUNITY): Payer: Self-pay | Admitting: *Deleted

## 2013-03-16 ENCOUNTER — Encounter (HOSPITAL_COMMUNITY): Payer: Medicaid Other | Admitting: Anesthesiology

## 2013-03-16 ENCOUNTER — Inpatient Hospital Stay (HOSPITAL_COMMUNITY): Payer: Medicaid Other | Admitting: Anesthesiology

## 2013-03-16 DIAGNOSIS — O24419 Gestational diabetes mellitus in pregnancy, unspecified control: Secondary | ICD-10-CM

## 2013-03-16 DIAGNOSIS — IMO0001 Reserved for inherently not codable concepts without codable children: Secondary | ICD-10-CM

## 2013-03-16 DIAGNOSIS — Z794 Long term (current) use of insulin: Secondary | ICD-10-CM

## 2013-03-16 DIAGNOSIS — O2432 Unspecified pre-existing diabetes mellitus in childbirth: Principal | ICD-10-CM | POA: Diagnosis present

## 2013-03-16 DIAGNOSIS — O2390 Unspecified genitourinary tract infection in pregnancy, unspecified trimester: Secondary | ICD-10-CM | POA: Diagnosis present

## 2013-03-16 DIAGNOSIS — O99892 Other specified diseases and conditions complicating childbirth: Secondary | ICD-10-CM | POA: Diagnosis present

## 2013-03-16 DIAGNOSIS — E119 Type 2 diabetes mellitus without complications: Secondary | ICD-10-CM | POA: Diagnosis present

## 2013-03-16 DIAGNOSIS — O3660X0 Maternal care for excessive fetal growth, unspecified trimester, not applicable or unspecified: Secondary | ICD-10-CM

## 2013-03-16 DIAGNOSIS — Z22330 Carrier of Group B streptococcus: Secondary | ICD-10-CM

## 2013-03-16 DIAGNOSIS — O9989 Other specified diseases and conditions complicating pregnancy, childbirth and the puerperium: Secondary | ICD-10-CM

## 2013-03-16 DIAGNOSIS — N390 Urinary tract infection, site not specified: Secondary | ICD-10-CM | POA: Diagnosis present

## 2013-03-16 LAB — COMPREHENSIVE METABOLIC PANEL
ALT: 10 U/L (ref 0–35)
AST: 17 U/L (ref 0–37)
Albumin: 3.2 g/dL — ABNORMAL LOW (ref 3.5–5.2)
Alkaline Phosphatase: 107 U/L (ref 39–117)
BUN: 6 mg/dL (ref 6–23)
CALCIUM: 10.1 mg/dL (ref 8.4–10.5)
CO2: 20 mEq/L (ref 19–32)
Chloride: 100 mEq/L (ref 96–112)
Creatinine, Ser: 0.52 mg/dL (ref 0.50–1.10)
GFR calc non Af Amer: >90 mL/min (ref >90)
GLUCOSE: 73 mg/dL (ref 70–99)
Potassium: 4 mEq/L (ref 3.7–5.3)
SODIUM: 137 meq/L (ref 137–147)
TOTAL PROTEIN: 6.9 g/dL (ref 6.0–8.3)
Total Bilirubin: 0.3 mg/dL (ref 0.3–1.2)

## 2013-03-16 LAB — GLUCOSE, CAPILLARY
GLUCOSE-CAPILLARY: 83 mg/dL (ref 70–99)
Glucose-Capillary: 103 mg/dL — ABNORMAL HIGH (ref 70–99)
Glucose-Capillary: 109 mg/dL — ABNORMAL HIGH (ref 70–99)

## 2013-03-16 LAB — CBC
HCT: 40.2 % (ref 36.0–46.0)
Hemoglobin: 13.6 g/dL (ref 12.0–15.0)
MCH: 24.8 pg — ABNORMAL LOW (ref 26.0–34.0)
MCHC: 33.8 g/dL (ref 30.0–36.0)
MCV: 73.2 fL — AB (ref 78.0–100.0)
PLATELETS: 122 10*3/uL — AB (ref 150–400)
RBC: 5.49 MIL/uL — ABNORMAL HIGH (ref 3.87–5.11)
RDW: 15 % (ref 11.5–15.5)
WBC: 7.5 10*3/uL (ref 4.0–10.5)

## 2013-03-16 LAB — TYPE AND SCREEN
ABO/RH(D): B POS
Antibody Screen: NEGATIVE

## 2013-03-16 MED ORDER — INSULIN REGULAR HUMAN 100 UNIT/ML IJ SOLN
INTRAMUSCULAR | Status: DC
  Filled 2013-03-16: qty 1

## 2013-03-16 MED ORDER — LACTATED RINGERS IV SOLN
500.0000 mL | INTRAVENOUS | Status: DC | PRN
Start: 2013-03-16 — End: 2013-03-17

## 2013-03-16 MED ORDER — EPHEDRINE 5 MG/ML INJ
10.0000 mg | INTRAVENOUS | Status: DC | PRN
  Filled 2013-03-16: qty 2

## 2013-03-16 MED ORDER — DEXTROSE IN LACTATED RINGERS 5 % IV SOLN: INTRAVENOUS | Status: DC

## 2013-03-16 MED ORDER — LACTATED RINGERS IV SOLN: 500.0000 mL | Freq: Once | INTRAVENOUS | Status: DC

## 2013-03-16 MED ORDER — ACETAMINOPHEN 325 MG PO TABS: 650.0000 mg | ORAL_TABLET | ORAL | Status: DC | PRN

## 2013-03-16 MED ORDER — PENICILLIN G POTASSIUM 5000000 UNITS IJ SOLR
5.0000 10*6.[IU] | Freq: Once | INTRAVENOUS | Status: AC
  Administered 2013-03-16: 5 10*6.[IU] via INTRAVENOUS
  Filled 2013-03-16: qty 5

## 2013-03-16 MED ORDER — PHENYLEPHRINE 40 MCG/ML (10ML) SYRINGE FOR IV PUSH (FOR BLOOD PRESSURE SUPPORT)
80.0000 ug | PREFILLED_SYRINGE | INTRAVENOUS | Status: DC | PRN
  Filled 2013-03-16: qty 2

## 2013-03-16 MED ORDER — PHENYLEPHRINE 40 MCG/ML (10ML) SYRINGE FOR IV PUSH (FOR BLOOD PRESSURE SUPPORT)
80.0000 ug | PREFILLED_SYRINGE | INTRAVENOUS | Status: DC | PRN
  Filled 2013-03-16: qty 10
  Filled 2013-03-16: qty 2

## 2013-03-16 MED ORDER — ONDANSETRON HCL 4 MG/2ML IJ SOLN: 4.0000 mg | Freq: Four times a day (QID) | INTRAMUSCULAR | Status: DC | PRN

## 2013-03-16 MED ORDER — LACTATED RINGERS IV SOLN
INTRAVENOUS | Status: DC
  Administered 2013-03-16 (×2): via INTRAVENOUS

## 2013-03-16 MED ORDER — LIDOCAINE HCL (PF) 1 % IJ SOLN
30.0000 mL | INTRAMUSCULAR | Status: DC | PRN
  Filled 2013-03-16: qty 30

## 2013-03-16 MED ORDER — OXYCODONE-ACETAMINOPHEN 5-325 MG PO TABS: 1.0000 | ORAL_TABLET | ORAL | Status: DC | PRN

## 2013-03-16 MED ORDER — OXYTOCIN 40 UNITS IN LACTATED RINGERS INFUSION - SIMPLE MED
62.5000 mL/h | INTRAVENOUS | Status: DC
  Filled 2013-03-16: qty 1000

## 2013-03-16 MED ORDER — OXYTOCIN BOLUS FROM INFUSION: 500.0000 mL | INTRAVENOUS | Status: DC

## 2013-03-16 MED ORDER — DIPHENHYDRAMINE HCL 50 MG/ML IJ SOLN: 12.5000 mg | INTRAMUSCULAR | Status: DC | PRN

## 2013-03-16 MED ORDER — LIDOCAINE HCL (PF) 1 % IJ SOLN
INTRAMUSCULAR | Status: DC | PRN
  Administered 2013-03-16 (×4): 4 mL

## 2013-03-16 MED ORDER — PENICILLIN G POTASSIUM 5000000 UNITS IJ SOLR
2.5000 10*6.[IU] | INTRAVENOUS | Status: DC
  Administered 2013-03-17: 2.5 10*6.[IU] via INTRAVENOUS
  Filled 2013-03-16 (×4): qty 2.5

## 2013-03-16 MED ORDER — FENTANYL 2.5 MCG/ML BUPIVACAINE 1/10 % EPIDURAL INFUSION (WH - ANES)
14.0000 mL/h | INTRAMUSCULAR | Status: DC | PRN
  Administered 2013-03-16: 12 mL/h via EPIDURAL
  Filled 2013-03-16: qty 125

## 2013-03-16 MED ORDER — CITRIC ACID-SODIUM CITRATE 334-500 MG/5ML PO SOLN: 30.0000 mL | ORAL | Status: DC | PRN

## 2013-03-16 MED ORDER — FENTANYL CITRATE 0.05 MG/ML IJ SOLN: 100.0000 ug | INTRAMUSCULAR | Status: DC | PRN

## 2013-03-16 MED ORDER — EPHEDRINE 5 MG/ML INJ
10.0000 mg | INTRAVENOUS | Status: DC | PRN
  Filled 2013-03-16: qty 2
  Filled 2013-03-16: qty 4

## 2013-03-16 MED ORDER — IBUPROFEN 600 MG PO TABS
600.0000 mg | ORAL_TABLET | Freq: Four times a day (QID) | ORAL | Status: DC | PRN
Start: 2013-03-16 — End: 2013-03-17
  Administered 2013-03-17: 600 mg via ORAL
  Filled 2013-03-16: qty 1

## 2013-03-16 NOTE — Progress Notes (Signed)
Filed Vitals:   03/16/13 1738 03/16/13 1845 03/16/13 2108 03/16/13 2115  BP: 142/83 132/73 153/80   Pulse: 75 76 97   Temp:   98.3 F (36.8 C)   TempSrc:   Oral   Resp:   20   Height:    5' (1.524 m)  Weight:    76.204 kg (168 lb)   Elevated blood pressures, will check CMP and Pr: cr  Tawana ScaleMichael Ryan Alp [place], MD OB Fellow

## 2013-03-16 NOTE — H&P (Signed)
Attestation of Attending Supervision of Fellow: Evaluation and management procedures were performed by the Fellow under my supervision and collaboration.  I have reviewed the Fellow's note and chart, and I agree with the management and plan.    

## 2013-03-16 NOTE — Anesthesia Preprocedure Evaluation (Signed)
Anesthesia Evaluation  Patient identified by MRN, date of birth, ID band Patient awake    Reviewed: Allergy & Precautions, H&P , NPO status , Patient's Chart, lab work & pertinent test results, reviewed documented beta blocker date and time   History of Anesthesia Complications Negative for: history of anesthetic complications  Airway Mallampati: I TM Distance: >3 FB Neck ROM: full    Dental  (+) Teeth Intact   Pulmonary neg pulmonary ROS,  breath sounds clear to auscultation        Cardiovascular negative cardio ROS  Rhythm:regular Rate:Normal     Neuro/Psych negative neurological ROS  negative psych ROS   GI/Hepatic negative GI ROS, Neg liver ROS,   Endo/Other  diabetes, Gestational, Oral Hypoglycemic AgentsBMI 33  Renal/GU negative Renal ROS     Musculoskeletal   Abdominal   Peds  Hematology negative hematology ROS (+)   Anesthesia Other Findings   Reproductive/Obstetrics (+) Pregnancy                           Anesthesia Physical Anesthesia Plan  ASA: II  Anesthesia Plan: Epidural   Post-op Pain Management:    Induction:   Airway Management Planned:   Additional Equipment:   Intra-op Plan:   Post-operative Plan:   Informed Consent: I have reviewed the patients History and Physical, chart, labs and discussed the procedure including the risks, benefits and alternatives for the proposed anesthesia with the patient or authorized representative who has indicated his/her understanding and acceptance.     Plan Discussed with:   Anesthesia Plan Comments:         Anesthesia Quick Evaluation

## 2013-03-16 NOTE — H&P (Signed)
Veronica Love is a 28 y.o. female [phone] with IUP at [redacted]w[redacted]d presenting for painful contractions that began this morning, associated with scant staining vaginal bleeding.  Membranes are intact, with active fetal movement.   [facility] at [facility] 15 wks  Prenatal History/Complications: Type 2 DM based on 2hr PP OGTT, Class B Hx of LGA - 10lb 7oz GBS UTI  Past Medical History: Past Medical History  Diagnosis Date  . No pertinent past medical history   . Gestational diabetes 2013  . History of positive PPD 2009    with negative chest x-ray    Past Surgical History: Past Surgical History  Procedure Laterality Date  . No past surgeries      Obstetrical History: OB History   Grav Para Term Preterm Abortions TAB SAB Ect Mult Living   4 2 2  1  1   2       Gynecological History: OB History   Grav Para Term Preterm Abortions TAB SAB Ect Mult Living   4 2 2  1  1   2       Social History: History   Social History  . Marital Status: Married    Spouse Name: N/A    Number of Children: N/A  . Years of Education: N/A   Social History Main Topics  . Smoking status: Never Smoker   . Smokeless tobacco: Never Used  . Alcohol Use: No  . Drug Use: No  . Sexual Activity: Yes   Other Topics Concern  . None   Social History Narrative  . None    Family History: History reviewed. No pertinent family history.  Allergies: No Known Allergies  Prescriptions prior to admission  Medication Sig Dispense Refill  . ACCU-CHEK FASTCLIX LANCETS MISC 1 each by Percutaneous route 4 (four) times daily.  102 each  12  . glucose blood test strip Use as instructed  100 each  12  . glyBURIDE (DIABETA) 5 MG tablet Take 5 mg by mouth 2 (two) times daily.      . Prenatal Multivit-Min-Fe-FA (PRENATAL VITAMINS) 0.8 MG tablet Take 1 tablet by mouth daily.  30 tablet  12     Review of Systems   Constitutional: Pt no complaints besides labor. No ha, vision changes, RUQ pain, or other complaints a this  time.  Blood pressure 132/73, pulse 76, temperature 98.1 F (36.7 C), temperature source Oral, resp. rate 18, last menstrual period 05/16/2012. General appearance: alert, cooperative, appears stated age and no distress Lungs: clear to auscultation bilaterally Heart: regular rate and rhythm Abdomen: soft, non-tender; bowel sounds normal, leopoold 4000g Pelvic: adequate proven to 10lbs Extremities: Homans sign is negative, no sign of DVT Presentation: cephalic Fetal monitoringBaseline: 150s bpm, Variability: Good {> 6 bpm), Accelerations: Reactive and Decelerations: Absent Uterine activity q3 min  Dilation: 6 Effacement (%): 60 Station: -2 Exam by:: Dr Ike Bene   Prenatal labs: ABO, Rh: --/--/B POS (02/19 2241) Antibody: NEG (02/19 2241) Rubella:   RPR: NON REAC (12/15 1045)  HBsAg:    HIV: Non-reactive (10/09 0000)  GBS:   POS  Genetic Screen Quad neg   Anatomic [place] Normal  Flu and Tdap 11/23/12  Glucose Screen tx from [facility] for abnormal early blood glucose  GBS  negative  Feeding Preference Bottle  Contraception OCPs  Circumcision No - female     Prenatal Transfer Tool  Maternal Diabetes: Yes:  Diabetes Type:  Insulin/Medication controlled Genetic Screening: Normal Maternal Ultrasounds/Referrals: Normal Fetal Ultrasounds or other Referrals:  Fetal echo normal Maternal Substance Abuse:  No Significant Maternal Medications:  Meds include: Other: glyburide Significant Maternal Lab Results: Lab values include: Group B Strep positive     No results found for this or any previous visit (from the past 24 hour(s)).  Assessment: Veronica Love is a 28 y.o. [phone] [id number] at 10345w2d by R=19 here for active labor #Labor: Expectant management. Will not augment given <38wks #Pain: Desires IV pain meds and epidural #FWB: Cat I #ID:  GBS + UTI - start PCN #MOF: Breast/Bottle #MOC: Undecided #DM2B: start on glucostabalizer. Will need to start back on metformin PP.  Cerra Eisenhower  RYAN 03/16/2013, 8:40 PM

## 2013-03-16 NOTE — Anesthesia Procedure Notes (Signed)
Epidural Patient location during procedure: OB Start time: 03/16/2013 9:56 PM  Staffing Performed by: anesthesiologist   Preanesthetic Checklist Completed: patient identified, site marked, surgical consent, pre-op evaluation, timeout performed, IV checked, risks and benefits discussed and monitors and equipment checked  Epidural Patient position: sitting Prep: site prepped and draped and DuraPrep Patient monitoring: continuous pulse ox and blood pressure Approach: midline Injection technique: LOR air  Needle:  Needle type: Tuohy  Needle gauge: 17 G Needle length: 9 cm and 9 Needle insertion depth: 5 cm cm Catheter type: closed end flexible Catheter size: 19 Gauge Catheter at skin depth: 10 cm Test dose: negative  Assessment Events: blood not aspirated, injection not painful, no injection resistance, negative IV test and no paresthesia  Additional Notes Discussed risk of headache, infection, bleeding, nerve injury and failed or incomplete block.  Patient voices understanding and wishes to proceed.  Epidural placed easily on first attempt.  No paresthesia.  Patient tolerated procedure well with no apparent complications.  Jasmine DecemberA. Devora Tortorella, MDReason for block:procedure for pain

## 2013-03-16 NOTE — MAU Provider Note (Signed)
(  Duplicate Note deleted)

## 2013-03-16 NOTE — MAU Note (Signed)
Contractions started this morning, getting stronger, not really closer. No leaking, small amt of bleeding.

## 2013-03-16 NOTE — Progress Notes (Signed)
Veronica Love is a 28 y.o. [id number] at 2115w2d by ultrasound admitted for active labor  Subjective: Pt feeling more comfortable now with epidural, feeling contractions.   Objective: BP 136/81  Pulse 82  Temp(Src) 98.3 F (36.8 C) (Oral)  Resp 20  Ht 5' (1.524 m)  Wt 76.204 kg (168 lb)  BMI 32.81 kg/m2  SpO2 99%  LMP 05/16/2012      FHT:  FHR: 155 bpm, variability: moderate,  accelerations:  Abscent,  decelerations:  Absent UC:   regular, every 2-3 minutes SVE:   Dilation: 8 Effacement (%): 80 Station: -2 Exam by:: Dr. Maggie Fontdum  Labs: Lab Results  Component Value Date   WBC 7.5 03/16/2013   HGB 13.6 03/16/2013   HCT 40.2 03/16/2013   MCV 73.2* 03/16/2013   PLT 122* 03/16/2013    Assessment / Plan: Spontaneous labor, progressing normally, Class B DM2  Labor: Progressing normally Preeclampsia:  no signs or symptoms of toxicity Fetal Wellbeing:  Category II Pain Control:  Epidural I/D:  Penicillin for GBS pos UTI Anticipated MOD:  NSVD DM2: At goal, continue glucose stabilizer  Kevin FentonBradshaw, Samuel 03/16/2013, 11:13 PM

## 2013-03-17 ENCOUNTER — Encounter (HOSPITAL_COMMUNITY): Payer: Self-pay | Admitting: *Deleted

## 2013-03-17 DIAGNOSIS — Z22330 Carrier of Group B streptococcus: Secondary | ICD-10-CM

## 2013-03-17 DIAGNOSIS — N390 Urinary tract infection, site not specified: Secondary | ICD-10-CM

## 2013-03-17 LAB — GLUCOSE, CAPILLARY
GLUCOSE-CAPILLARY: 72 mg/dL (ref 70–99)
GLUCOSE-CAPILLARY: 91 mg/dL (ref 70–99)
GLUCOSE-CAPILLARY: 93 mg/dL (ref 70–99)
Glucose-Capillary: 86 mg/dL (ref 70–99)
Glucose-Capillary: 90 mg/dL (ref 70–99)
Glucose-Capillary: 87 mg/dL (ref 70–99)
Glucose-Capillary: 100 mg/dL — ABNORMAL HIGH (ref 70–99)

## 2013-03-17 LAB — PROTEIN / CREATININE RATIO, URINE
CREATININE, URINE: 94.61 mg/dL
PROTEIN CREATININE RATIO: 0.48 — AB (ref 0.00–0.15)
TOTAL PROTEIN, URINE: 45.7 mg/dL

## 2013-03-17 LAB — RPR: RPR: NONREACTIVE

## 2013-03-17 LAB — RUBELLA SCREEN: Rubella: 3.77 Index — ABNORMAL HIGH (ref <0.90)

## 2013-03-17 LAB — HEPATITIS B SURFACE ANTIGEN: HEP B S AG: NEGATIVE

## 2013-03-17 MED ORDER — ONDANSETRON HCL 4 MG PO TABS: 4.0000 mg | ORAL_TABLET | ORAL | Status: DC | PRN

## 2013-03-17 MED ORDER — SENNOSIDES-DOCUSATE SODIUM 8.6-50 MG PO TABS
2.0000 | ORAL_TABLET | ORAL | Status: DC
  Administered 2013-03-17: 2 via ORAL
  Filled 2013-03-17: qty 2

## 2013-03-17 MED ORDER — OXYCODONE-ACETAMINOPHEN 5-325 MG PO TABS
1.0000 | ORAL_TABLET | ORAL | Status: DC | PRN
  Administered 2013-03-17: 2 via ORAL
  Administered 2013-03-17 – 2013-03-18 (×2): 1 via ORAL
  Filled 2013-03-17: qty 1
  Filled 2013-03-17: qty 2
  Filled 2013-03-17: qty 1

## 2013-03-17 MED ORDER — IBUPROFEN 600 MG PO TABS
600.0000 mg | ORAL_TABLET | Freq: Four times a day (QID) | ORAL | Status: DC
  Administered 2013-03-17 – 2013-03-18 (×5): 600 mg via ORAL
  Filled 2013-03-17 (×5): qty 1

## 2013-03-17 MED ORDER — DIPHENHYDRAMINE HCL 25 MG PO CAPS: 25.0000 mg | ORAL_CAPSULE | Freq: Four times a day (QID) | ORAL | Status: DC | PRN

## 2013-03-17 MED ORDER — ONDANSETRON HCL 4 MG/2ML IJ SOLN: 4.0000 mg | INTRAMUSCULAR | Status: DC | PRN

## 2013-03-17 MED ORDER — ZOLPIDEM TARTRATE 5 MG PO TABS: 5.0000 mg | ORAL_TABLET | Freq: Every evening | ORAL | Status: DC | PRN

## 2013-03-17 MED ORDER — WITCH HAZEL-GLYCERIN EX PADS: 1.0000 "application " | MEDICATED_PAD | CUTANEOUS | Status: DC | PRN

## 2013-03-17 MED ORDER — BENZOCAINE-MENTHOL 20-0.5 % EX AERO: 1.0000 "application " | INHALATION_SPRAY | CUTANEOUS | Status: DC | PRN

## 2013-03-17 MED ORDER — PRENATAL MULTIVITAMIN CH
1.0000 | ORAL_TABLET | Freq: Every day | ORAL | Status: DC
  Administered 2013-03-17 – 2013-03-18 (×2): 1 via ORAL
  Filled 2013-03-17 (×2): qty 1

## 2013-03-17 MED ORDER — DIBUCAINE 1 % RE OINT: 1.0000 "application " | TOPICAL_OINTMENT | RECTAL | Status: DC | PRN

## 2013-03-17 MED ORDER — METFORMIN HCL 500 MG PO TABS
500.0000 mg | ORAL_TABLET | Freq: Two times a day (BID) | ORAL | Status: DC
  Administered 2013-03-17 – 2013-03-18 (×3): 500 mg via ORAL
  Filled 2013-03-17 (×5): qty 1

## 2013-03-17 MED ORDER — SIMETHICONE 80 MG PO CHEW: 80.0000 mg | CHEWABLE_TABLET | ORAL | Status: DC | PRN

## 2013-03-17 MED ORDER — TETANUS-DIPHTH-ACELL PERTUSSIS 5-2.5-18.5 LF-MCG/0.5 IM SUSP: 0.5000 mL | Freq: Once | INTRAMUSCULAR | Status: DC

## 2013-03-17 MED ORDER — LANOLIN HYDROUS EX OINT: TOPICAL_OINTMENT | CUTANEOUS | Status: DC | PRN

## 2013-03-17 NOTE — Progress Notes (Signed)
[name] ClientLue H Forti is a 28 y.o. [phone] [id number] at 5274w2d by ultrasound admitted for active labor  Subjective: Pt feeling more comfortable now with epidural, feeling contractions.   Objective: BP 126/79  Pulse 93  Temp(Src) 98.7 F (37.1 C) (Oral)  Resp 20  Ht 5' (1.524 m)  Wt 76.204 kg (168 lb)  BMI 32.81 kg/m2  SpO2 99%  LMP 05/16/2012   Total I/O In: 350 [IV Piggyback:350] Out: -   FHT:  FHR: 155 bpm, variability: moderate,  accelerations:  Present,  decelerations:  Present variable decels UC:   regular, every 2-3 minutes SVE:   Dilation: 10 Effacement (%): 100 Station: 0 Exam by:: F. Morris, RNC  Labs: Lab Results  Component Value Date   WBC 7.5 03/16/2013   HGB 13.6 03/16/2013   HCT 40.2 03/16/2013   MCV 73.2* 03/16/2013   PLT 122* 03/16/2013    Assessment / Plan: Spontaneous labor, progressing normally, Class B DM2  Labor: Progressing normally, will start pushing Preeclampsia:  no signs or symptoms of toxicity Fetal Wellbeing:  Category II Pain Control:  Epidural I/D:  Penicillin for GBS pos UTI Anticipated MOD:  NSVD DM2: At goal, continue glucose stabilizer  Ernestine Langworthy RYAN 03/17/2013, 3:23 AM

## 2013-03-17 NOTE — Progress Notes (Signed)
UR completed 

## 2013-03-17 NOTE — Anesthesia Postprocedure Evaluation (Signed)
  Anesthesia Post Note  Patient: Veronica Love  Procedure(s) Performed: * No procedures listed *  Anesthesia type: Epidural  Patient location: Women's unit  Post pain: Pain level controlled  Post assessment: Post-op Vital signs reviewed  Last Vitals:  Filed Vitals:   03/17/13 1030  BP: 139/82  Pulse: 70  Temp: 36.5 C  Resp: 18    Post vital signs: Reviewed  Level of consciousness:alert  Complications: No apparent anesthesia complications

## 2013-03-17 NOTE — Progress Notes (Signed)
Pt instructed to begin pushing with ctxs.  Fetal head descended well with push, fetal head crowning.  Pt instructed to stop pushing until MD in room.

## 2013-03-17 NOTE — Lactation Note (Signed)
This note was copied from the chart of Veronica Kittie PlaterLue Dewald. Lactation Consultation Note   Initial consult witht his mom of a NICU baby, mom a GDM, and baby LGA with hypoglycemia. Mom had a baby just under 1 year ago, also in the NICU, so she is familiar with pumping and hand expression. Mom collected 2 mls of colostrum, and teaching on DEP and HE was reviewed with mom. She is active with WIC, and will call . I will follow this family while the baby is in the NICU, and after in o/p prn.  Patient Name: Veronica Love [id number]'s Date: 03/17/2013 Reason for consult: Initial assessment;NICU baby;Late preterm infant   Maternal Data Formula Feeding for Exclusion: Yes (baby in NICU) Reason for exclusion: Mother's choice to formula and breast feed on admission Infant to breast within first hour of birth: No Breastfeeding delayed due to:: Infant status Has patient been taught Hand Expression?: Yes Does the patient have breastfeeding experience prior to this delivery?: Yes  Feeding Feeding Type: Formula Nipple Type: Slow - flow Length of feed: 20 min  LATCH Score/Interventions                      Lactation Tools Discussed/Used Tools: Pump Breast pump type: Double-Electric Breast Pump WIC Program: Yes (mom knows to callto add baby and for DEP) Pump Review: Setup, frequency, and cleaning;Milk Storage;Other (comment) (premie setting, hand expression) Initiated by:: clee rn at 6 hoursw postpartum Date initiated:: 03/17/13   Consult Status Consult Status: Follow-up Date: 03/18/13 Follow-up type: In-patient    Alfred LevinsLee, Yetunde Leis Anne 03/17/2013, 1:26 PM

## 2013-03-17 NOTE — Progress Notes (Signed)
I spoke with and examined patient and agree with resident's note and plan of care.  Tawana ScaleMichael Ryan Jannet Calip, MD OB Fellow 03/17/2013 4:11 AM

## 2013-03-18 ENCOUNTER — Other Ambulatory Visit: Payer: Medicaid Other

## 2013-03-18 ENCOUNTER — Encounter: Payer: Self-pay | Admitting: Family Medicine

## 2013-03-18 LAB — GLUCOSE, CAPILLARY: Glucose-Capillary: 72 mg/dL (ref 70–99)

## 2013-03-18 MED ORDER — METFORMIN HCL 500 MG PO TABS: 500.0000 mg | ORAL_TABLET | Freq: Two times a day (BID) | ORAL | Status: DC

## 2013-03-18 NOTE — Lactation Note (Signed)
This note was copied from the chart of Veronica Kittie PlaterLue Mersman. Lactation Consultation Note     Follow up consult with this mom of a NICU baby, now 3130 hours old, and 37 4/7 weeks corrected gestation. Mom denies needing any help with pumping at this time. Mom and dad on their way to visit the baby in the NICU.  Patient Name: Veronica Love Reason for consult: Follow-up assessment;NICU baby   Maternal Data    Feeding Feeding Type: Bottle Fed - Formula Nipple Type: Slow - flow Length of feed: 25 min  LATCH Score/Interventions                      Lactation Tools Discussed/Used     Consult Status Consult Status: Follow-up Date: 03/19/13 Follow-up type: In-patient    Veronica Love, Veronica Love Love, 10:00 AM

## 2013-03-18 NOTE — Progress Notes (Signed)
Pt discharged home with husband... Condition stable... No equipment.. Ambulated to car with L. Snyder, NT.  

## 2013-03-18 NOTE — Lactation Note (Signed)
This note was copied from the chart of Veronica Kittie PlaterLue Dallaire. Lactation Consultation Note       Follow up consult with this mom of a NICU baby, now 31 hours post partum, and 37 4/7 weeks corrected gestation.   I assisted mom with latching baby Vic Ripperfir the first time. Mom started using cradle hold, but baby was not staying latched, and was latched shallow. I showed mom how to use cross cradle, the baby latched deeper, and when began sucking, and put mom back in cradle hold. The baby was very eager to feed, with strong, rhythmic suckles. I encouraged mom to pump after feeding the baby, and to add hand expression, despite the discomfort this causes.   Patient Name: Veronica Love [id number]'XToday's Date: 03/18/2013 Reason for consult: Follow-up assessment;NICU baby   Maternal Data    Feeding Feeding Type: Breast Fed Nipple Type: Slow - flow Length of feed: 25 min  LATCH Score/Interventions Latch: Repeated attempts needed to sustain latch, nipple held in mouth throughout feeding, stimulation needed to elicit sucking reflex.  Audible Swallowing: A few with stimulation Intervention(s): Skin to skin  Type of Nipple: Everted at rest and after stimulation  Comfort (Breast/Nipple): Soft / non-tender     Hold (Positioning): Assistance needed to correctly position infant at breast and maintain latch. Intervention(s): Breastfeeding basics reviewed;Support Pillows;Position options;Skin to skin  LATCH Score: 7  Lactation Tools Discussed/Used     Consult Status Consult Status: Follow-up Date: 03/19/13 Follow-up type: In-patient    Alfred LevinsLee, Akyah Lagrange Anne 03/18/2013, 11:29 AM

## 2013-03-18 NOTE — Discharge Instructions (Signed)

## 2013-03-18 NOTE — MAU Provider Note (Signed)
See H&P in chart

## 2013-03-18 NOTE — Discharge Summary (Signed)
Obstetric Discharge Summary Reason for Admission: onset of labor Prenatal Procedures: none Intrapartum Procedures: spontaneous vaginal delivery Postpartum Procedures: none Complications-Operative and Postpartum: none Hemoglobin  Date Value Ref Range Status  03/16/2013 13.6  12.0 - 15.0 g/dL Final  16/1/096010/09/2012 45.411.7   Final     HCT  Date Value Ref Range Status  03/16/2013 40.2  36.0 - 46.0 % Final  11/05/2012 36   Final  Hospital course Ms Vicki Malletie presented in active labor and proceeded to deliver a female infant without complication. She was changed from glyburide to metformin for treatment of her diabetes. She wanted an interval tubal so consent papers were collected on the day of dc. Her baby was sent to the NICU for hypoglycemia.   Physical Exam:  General: alert, cooperative and appears stated age Lochia: appropriate Uterine Fundus: firm Incision: healing well DVT Evaluation: No evidence of DVT seen on physical exam. Negative Homan's sign. No cords or calf tenderness.  Discharge Diagnoses: Term Pregnancy-delivered  Discharge Information: Date: 03/18/2013 Activity: unrestricted Diet: carb modified Medications: PNV, Ibuprofen and metformin Condition: stable Instructions: refer to practice specific booklet Discharge to: home Follow-up Information   [facility] (for appt in 4 weeks)    Specialty:  Obstetrics and Gynecology   Contact information:   [address] [place] [address] [phone]      Newborn Data: Live born female  Birth Weight: 8 lb 6.8 oz (3820 g) APGAR: 8, 9  Home with mother.  Kevin FentonBradshaw, Samuel 03/18/2013, 8:00 AM  I have seen and examined this patient and I agree with the above. Cam HaiSHAW, Lyvonne Cassell 8:41 AM 03/18/2013

## 2013-03-22 ENCOUNTER — Other Ambulatory Visit: Payer: Medicaid Other

## 2013-03-22 ENCOUNTER — Ambulatory Visit (HOSPITAL_COMMUNITY): Admission: RE | Admit: 2013-03-22 | Payer: Medicaid Other | Source: Ambulatory Visit

## 2013-03-28 ENCOUNTER — Inpatient Hospital Stay (HOSPITAL_COMMUNITY): Admission: RE | Admit: 2013-03-28 | Payer: Medicaid Other | Source: Ambulatory Visit

## 2013-04-26 ENCOUNTER — Ambulatory Visit (INDEPENDENT_AMBULATORY_CARE_PROVIDER_SITE_OTHER): Payer: Medicaid Other | Admitting: Family Medicine

## 2013-04-26 ENCOUNTER — Encounter: Payer: Self-pay | Admitting: Family Medicine

## 2013-04-26 DIAGNOSIS — E119 Type 2 diabetes mellitus without complications: Secondary | ICD-10-CM

## 2013-04-26 DIAGNOSIS — Z3009 Encounter for other general counseling and advice on contraception: Secondary | ICD-10-CM

## 2013-04-26 DIAGNOSIS — Z308 Encounter for other contraceptive management: Secondary | ICD-10-CM

## 2013-04-26 DIAGNOSIS — Z30017 Encounter for initial prescription of implantable subdermal contraceptive: Secondary | ICD-10-CM

## 2013-04-26 DIAGNOSIS — Z01812 Encounter for preprocedural laboratory examination: Secondary | ICD-10-CM

## 2013-04-26 DIAGNOSIS — Z3049 Encounter for surveillance of other contraceptives: Secondary | ICD-10-CM

## 2013-04-26 LAB — POCT PREGNANCY, URINE: PREG TEST UR: NEGATIVE

## 2013-04-26 MED ORDER — ETONOGESTREL 68 MG ~~LOC~~ IMPL
68.0000 mg | DRUG_IMPLANT | Freq: Once | SUBCUTANEOUS | Status: AC
  Administered 2013-04-26: 68 mg via SUBCUTANEOUS

## 2013-04-26 MED ORDER — ETONOGESTREL 68 MG ~~LOC~~ IMPL: 1.0000 | DRUG_IMPLANT | Freq: Once | SUBCUTANEOUS | Status: AC

## 2013-04-26 MED ORDER — METFORMIN HCL 500 MG PO TABS: 500.0000 mg | ORAL_TABLET | Freq: Two times a day (BID) | ORAL | Status: DC

## 2013-04-26 MED ORDER — ETONOGESTREL 68 MG ~~LOC~~ IMPL: 1.0000 | DRUG_IMPLANT | Freq: Once | SUBCUTANEOUS | Status: DC

## 2013-04-26 NOTE — Patient Instructions (Signed)
Etonogestrel implant What is this medicine? ETONOGESTREL (et oh noe JES trel) is a contraceptive (birth control) device. It is used to prevent pregnancy. It can be used for up to 3 years. This medicine may be used for other purposes; ask your health care provider or pharmacist if you have questions. COMMON BRAND NAME(S): Implanon, Nexplanon  What should I tell my health care provider before I take this medicine? They need to know if you have any of these conditions: -abnormal vaginal bleeding -blood vessel disease or blood clots -cancer of the breast, cervix, or liver -depression -diabetes -gallbladder disease -headaches -heart disease or recent heart attack -high blood pressure -high cholesterol -kidney disease -liver disease -renal disease -seizures -tobacco smoker -an unusual or allergic reaction to etonogestrel, other hormones, anesthetics or antiseptics, medicines, foods, dyes, or preservatives -pregnant or trying to get pregnant -breast-feeding How should I use this medicine? This device is inserted just under the skin on the inner side of your upper arm by a health care professional. Talk to your pediatrician regarding the use of this medicine in children. Special care may be needed. Overdosage: If you think you've taken too much of this medicine contact a poison control center or emergency room at once. Overdosage: If you think you have taken too much of this medicine contact a poison control center or emergency room at once. NOTE: This medicine is only for you. Do not share this medicine with others. What if I miss a dose? This does not apply. What may interact with this medicine? Do not take this medicine with any of the following medications: -amprenavir -bosentan -fosamprenavir This medicine may also interact with the following medications: -barbiturate medicines for inducing sleep or treating seizures -certain medicines for fungal infections like ketoconazole and  itraconazole -griseofulvin -medicines to treat seizures like carbamazepine, felbamate, oxcarbazepine, phenytoin, topiramate -modafinil -phenylbutazone -rifampin -some medicines to treat HIV infection like atazanavir, indinavir, lopinavir, nelfinavir, tipranavir, ritonavir -St. John's wort This list may not describe all possible interactions. Give your health care provider a list of all the medicines, herbs, non-prescription drugs, or dietary supplements you use. Also tell them if you smoke, drink alcohol, or use illegal drugs. Some items may interact with your medicine. What should I watch for while using this medicine? This product does not protect you against HIV infection (AIDS) or other sexually transmitted diseases. You should be able to feel the implant by pressing your fingertips over the skin where it was inserted. Tell your doctor if you cannot feel the implant. What side effects may I notice from receiving this medicine? Side effects that you should report to your doctor or health care professional as soon as possible: -allergic reactions like skin rash, itching or hives, swelling of the face, lips, or tongue -breast lumps -changes in vision -confusion, trouble speaking or understanding -dark urine -depressed mood -general ill feeling or flu-like symptoms -light-colored stools -loss of appetite, nausea -right upper belly pain -severe headaches -severe pain, swelling, or tenderness in the abdomen -shortness of breath, chest pain, swelling in a leg -signs of pregnancy -sudden numbness or weakness of the face, arm or leg -trouble walking, dizziness, loss of balance or coordination -unusual vaginal bleeding, discharge -unusually weak or tired -yellowing of the eyes or skin Side effects that usually do not require medical attention (Report these to your doctor or health care professional if they continue or are bothersome.): -acne -breast pain -changes in  weight -cough -fever or chills -headache -irregular menstrual bleeding -itching, burning,   and vaginal discharge -pain or difficulty passing urine -sore throat This list may not describe all possible side effects. Call your doctor for medical advice about side effects. You may report side effects to FDA at 1-800-FDA-1088. Where should I keep my medicine? This drug is given in a hospital or clinic and will not be stored at home. NOTE: This sheet is a summary. It may not cover all possible information. If you have questions about this medicine, talk to your doctor, pharmacist, or health care provider.  2014, Elsevier/Gold Standard. (2011-07-22 15:37:45)  

## 2013-04-26 NOTE — Addendum Note (Signed)
Addended by: Gerome ApleyZEYFANG, Donie Lemelin L on: 04/26/2013 05:04 PM   Modules accepted: Orders

## 2013-04-26 NOTE — Progress Notes (Signed)
Patient ID: Veronica Love, female   DOB: 12-May-1985, 28 y.o.   MRN: [id number] Subjective:     Veronica Love is a 28 y.o. female who presents for a postpartum visit. She is 6 weeks postpartum following a spontaneous vaginal delivery. I have fully reviewed the prenatal and intrapartum course. The delivery was at 38 gestational weeks. Outcome: spontaneous vaginal delivery. Anesthesia: epidural. Postpartum course has been uncomplicated. Pt has not been taking her metformin and has not established care for diabetes. Baby's course has been uncomplicated. Baby is feeding by breast. Bleeding no bleeding. Bowel function is normal. Bladder function is normal. Patient is not sexually active. Contraception method is none and desires BTL. Postpartum depression screening: negative.  The following portions of the patient's history were reviewed and updated as appropriate: allergies, current medications, past family history, past medical history, past social history, past surgical history and problem list.  Review of Systems Pertinent items are noted in HPI.   Objective:    BP 132/85  Pulse 64  Temp(Src) 98 F (36.7 C)  Ht 5' (1.524 m)  Wt 65.545 kg (144 lb 8 oz)  BMI 28.22 kg/m2  Breastfeeding? Yes  General:  alert, cooperative, appears stated age and no distress   Breasts:  inspection negative, no nipple discharge or bleeding, no masses or nodularity palpable and not examined  Lungs: clear to auscultation bilaterally and normal percussion bilaterally  Heart:  regular rate and rhythm, S1, S2 normal, no murmur, click, rub or gallop  Abdomen: soft, non-tender; bowel sounds normal; no masses,  no organomegaly   Vulva:  normal  Vagina: not evaluated  Cervix:  Not examined  Corpus: not examined  Adnexa:   not examined  Rectal Exam: Not performed.        Assessment:     6wk postpartum exam. Pap smear not done at today's visit.   Plan:    1. Contraception: Nexplanon 2. Pt desired tubal - however unable to  find paperwork. Will use nexplanon instead. #breastfeeding - continue PNV 3. Follow up in: as needed.  #DM2: cont metformin, refer to FM for continued care.  Veronica Love is a 28 y.o. year old Asian female here for Nexplanon insertion.  Her LMP prior to pregnancy , and her pregnancy test today was negative.  Risks/benefits/side effects of Nexplanon have been discussed and her questions have been answered.  Specifically, a failure rate of 01/998 has been reported, with an increased failure rate if pt takes St. John's Wort and/or antiseizure medicaitons.  Veronica Love is aware of the common side effect of irregular bleeding, which the incidence of decreases over time.  Her right arm, approximatly 4 inches proximal from the elbow, was cleansed with alcohol and anesthetized with 2cc of 2% Lidocaine.  The area was cleansed again and the Nexplanon was inserted without difficulty.  A pressure bandage was applied.  Pt was instructed to remove pressure bandage in a few hours, and keep insertion site covered with a bandaid for 3 days.  Back up contraception was recommended for 7d.  Follow-up scheduled PRN problems  Ninoska Goswick RYAN 04/26/2013 4:30 PM

## 2013-04-27 ENCOUNTER — Other Ambulatory Visit: Payer: Self-pay | Admitting: Obstetrics and Gynecology

## 2013-04-28 ENCOUNTER — Encounter: Payer: Self-pay | Admitting: *Deleted

## 2013-05-24 ENCOUNTER — Ambulatory Visit: Payer: Medicaid Other | Attending: Internal Medicine

## 2013-09-27 ENCOUNTER — Encounter: Payer: Self-pay | Admitting: Internal Medicine

## 2013-09-27 ENCOUNTER — Ambulatory Visit: Payer: Self-pay | Attending: Internal Medicine | Admitting: Internal Medicine

## 2013-09-27 VITALS — BP 113/75 | HR 71 | Temp 98.9°F | Resp 16 | Ht 61.0 in | Wt 150.0 lb

## 2013-09-27 DIAGNOSIS — O24429 Gestational diabetes mellitus in childbirth, unspecified control: Secondary | ICD-10-CM

## 2013-09-27 DIAGNOSIS — IMO0001 Reserved for inherently not codable concepts without codable children: Secondary | ICD-10-CM

## 2013-09-27 DIAGNOSIS — O99814 Abnormal glucose complicating childbirth: Secondary | ICD-10-CM

## 2013-09-27 DIAGNOSIS — R7611 Nonspecific reaction to tuberculin skin test without active tuberculosis: Secondary | ICD-10-CM | POA: Insufficient documentation

## 2013-09-27 DIAGNOSIS — E119 Type 2 diabetes mellitus without complications: Secondary | ICD-10-CM | POA: Insufficient documentation

## 2013-09-27 DIAGNOSIS — Z23 Encounter for immunization: Secondary | ICD-10-CM

## 2013-09-27 DIAGNOSIS — E1165 Type 2 diabetes mellitus with hyperglycemia: Secondary | ICD-10-CM

## 2013-09-27 DIAGNOSIS — Z8632 Personal history of gestational diabetes: Secondary | ICD-10-CM | POA: Insufficient documentation

## 2013-09-27 DIAGNOSIS — Z833 Family history of diabetes mellitus: Secondary | ICD-10-CM | POA: Insufficient documentation

## 2013-09-27 LAB — GLUCOSE, POCT (MANUAL RESULT ENTRY): POC Glucose: 146 mg/dl — AB (ref 70–99)

## 2013-09-27 LAB — CBC
HCT: 40.8 % (ref 36.0–46.0)
Hemoglobin: 13.7 g/dL (ref 12.0–15.0)
MCH: 24.4 pg — ABNORMAL LOW (ref 26.0–34.0)
MCHC: 33.6 g/dL (ref 30.0–36.0)
MCV: 72.6 fL — ABNORMAL LOW (ref 78.0–100.0)
Platelets: 214 10*3/uL (ref 150–400)
RBC: 5.62 MIL/uL — AB (ref 3.87–5.11)
RDW: 14.3 % (ref 11.5–15.5)
WBC: 5.2 10*3/uL (ref 4.0–10.5)

## 2013-09-27 LAB — LIPID PANEL
Cholesterol: 230 mg/dL — ABNORMAL HIGH (ref 0–200)
HDL: 44 mg/dL (ref >39)
LDL CALC: 148 mg/dL — AB (ref 0–99)
Total CHOL/HDL Ratio: 5.2 Ratio
Triglycerides: 191 mg/dL — ABNORMAL HIGH (ref <150)
VLDL: 38 mg/dL (ref 0–40)

## 2013-09-27 LAB — COMPLETE METABOLIC PANEL WITH GFR
ALT: 26 U/L (ref 0–35)
AST: 19 U/L (ref 0–37)
Albumin: 5.1 g/dL (ref 3.5–5.2)
Alkaline Phosphatase: 67 U/L (ref 39–117)
BUN: 10 mg/dL (ref 6–23)
CO2: 22 mEq/L (ref 19–32)
Calcium: 10 mg/dL (ref 8.4–10.5)
Chloride: 105 mEq/L (ref 96–112)
Creat: 0.61 mg/dL (ref 0.50–1.10)
GFR, Est African American: >89 mL/min
GFR, Est Non African American: >89 mL/min
Glucose, Bld: 138 mg/dL — ABNORMAL HIGH (ref 70–99)
POTASSIUM: 4.7 meq/L (ref 3.5–5.3)
Sodium: 139 mEq/L (ref 135–145)
TOTAL PROTEIN: 8.5 g/dL — AB (ref 6.0–8.3)
Total Bilirubin: 0.4 mg/dL (ref 0.2–1.2)

## 2013-09-27 LAB — TSH: TSH: 0.397 u[IU]/mL (ref 0.350–4.500)

## 2013-09-27 LAB — POCT GLYCOSYLATED HEMOGLOBIN (HGB A1C): HEMOGLOBIN A1C: 8.3

## 2013-09-27 MED ORDER — METFORMIN HCL 1000 MG PO TABS: 1000.0000 mg | ORAL_TABLET | Freq: Two times a day (BID) | ORAL | Status: DC

## 2013-09-27 NOTE — Progress Notes (Signed)
During pregnancy pt had gestational diabetes. Pt is wanting to get tested again to see if she is no longer diabetic. Pt reports getting cold frequently.

## 2013-09-27 NOTE — Progress Notes (Signed)
Patient ID: Veronica Love, female   DOB: 1985/02/18, 28 y.o.   MRN: [id number]  OZH:[id number]  GEX:[id number]  DOB - [birth year]  CC:  Chief Complaint  Patient presents with  . Establish Care       HPI: Veronica Love is a 28 y.o. female here today to establish medical care.  Patient reports that she was found to have gestational diabetes in her last pregnancy. Her last child is now 61 old and she is concerned about still having diabetes.  She notes that her vision is not as clear as it once was.  She denies polyuria, polydipsia, polyphagia, or tingling.  She has a family history of DM.  She denies alcohol, drug, and tobacco use.  She denies all other complaints.     Patient has No headache, No chest pain, No abdominal pain - No Nausea, No new weakness tingling or numbness, No Cough - SOB.  No Known Allergies Past Medical History  Diagnosis Date  . No pertinent past medical history   . Gestational diabetes 2013  . History of positive PPD 2009    with negative chest x-ray   Current Outpatient Prescriptions on File Prior to Visit  Medication Sig Dispense Refill  . ACCU-CHEK FASTCLIX LANCETS MISC 1 each by Percutaneous route 4 (four) times daily.  102 each  12  . etonogestrel (NEXPLANON) 68 MG IMPL implant Inject 1 each (68 mg total) into the skin once.  1 each  0  . etonogestrel (NEXPLANON) 68 MG IMPL implant Inject 1 each (68 mg total) into the skin once.  1 each  0  . glucose blood test strip Use as instructed  100 each  12  . metFORMIN (GLUCOPHAGE) 500 MG tablet Take 1 tablet (500 mg total) by mouth 2 (two) times daily with a meal.  60 tablet  2  . Prenatal Vit-Fe Fumarate-FA (PREPLUS) 27-1 MG TABS TAKE 1 TABLET BY MOUTH EVERY DAY  30 tablet  0   No current facility-administered medications on file prior to visit.   Family History  Problem Relation Age of Onset  . Diabetes Father    History   Social History  . Marital Status: Married    Spouse Name: N/A    Number of Children:  N/A  . Years of Education: N/A   Occupational History  . Not on file.   Social History Main Topics  . Smoking status: Never Smoker   . Smokeless tobacco: Never Used  . Alcohol Use: No  . Drug Use: No  . Sexual Activity: Yes   Other Topics Concern  . Not on file   Social History Narrative  . No narrative on file    Review of Systems: Constitutional: Negative for fever, chills, diaphoresis, activity change, appetite change and fatigue. HENT: Negative for ear pain, nosebleeds, congestion, facial swelling, rhinorrhea, neck pain, neck stiffness and ear discharge.  Eyes: Negative for pain, discharge, redness, itching and visual disturbance. Respiratory: Negative for cough, choking, chest tightness, shortness of breath, wheezing and stridor.  Cardiovascular: Negative for chest pain, palpitations and leg swelling. Gastrointestinal: Negative for abdominal distention. Genitourinary: Negative for dysuria, urgency, frequency, hematuria, flank pain, decreased urine volume, difficulty urinating and dyspareunia.  Musculoskeletal: Negative for back pain, joint swelling, arthralgia and gait problem. Neurological: Negative for dizziness, tremors, seizures, syncope, facial asymmetry, speech difficulty, weakness, light-headedness, numbness and headaches.  Hematological: Negative for adenopathy. Does not bruise/bleed easily. Psychiatric/Behavioral: Negative for hallucinations, behavioral problems, confusion, dysphoric mood, decreased concentration and  agitation.    Objective:   Filed Vitals:   09/27/13 1025  BP: 113/75  Pulse: 71  Temp: 98.9 F (37.2 C)  Resp: 16   Physical Exam  Constitutional: She is oriented to person, place, and time.  HENT:  Right Ear: External ear normal.  Left Ear: External ear normal.  Mouth/Throat: Oropharynx is clear and moist.  Eyes: Conjunctivae and EOM are normal. Pupils are equal, round, and reactive to light.  Neck: Normal range of motion. Neck supple. No  thyromegaly present.  Cardiovascular: Normal rate, regular rhythm and normal heart sounds.   Pulmonary/Chest: Effort normal and breath sounds normal.  Abdominal: Soft. Bowel sounds are normal.  Musculoskeletal: Normal range of motion.  Neurological: She is alert and oriented to person, place, and time. She has normal reflexes.  Skin: Skin is warm and dry.     Lab Results  Component Value Date   WBC 7.5 03/16/2013   HGB 13.6 03/16/2013   HCT 40.2 03/16/2013   MCV 73.2* 03/16/2013   PLT 122* 03/16/2013   Lab Results  Component Value Date   CREATININE 0.52 03/16/2013   BUN 6 03/16/2013   NA 137 03/16/2013   K 4.0 03/16/2013   CL 100 03/16/2013   CO2 20 03/16/2013    Lab Results  Component Value Date   HGBA1C 8.3 09/27/2013   Lipid Panel     Component Value Date/Time   CHOL 211* 04/28/2012 0955   TRIG 103 04/28/2012 0955   HDL 47 04/28/2012 0955   CHOLHDL 4.5 04/28/2012 0955   VLDL 21 04/28/2012 0955   LDLCALC 143* 04/28/2012 0955       Assessment and plan:   Tria was seen today for establish care.  Diagnoses and associated orders for this visit:  Gestational diabetes mellitus in childbirth - Glucose (CBG) - HgB A1c  Type II or unspecified type diabetes mellitus without mention of complication, uncontrolled - Begin metFORMIN (GLUCOPHAGE) 1000 MG tablet; Take 1 tablet (1,000 mg total) by mouth 2 (two) times daily with a meal. Take 0.5 tablets (500 mg) twice daily for 2 weeks, then take 1 tablet (1,000mg ) twice daily after - CBC - COMPLETE METABOLIC PANEL WITH GFR - Lipid panel - TSH  Need for prophylactic vaccination and inoculation against influenza Will receive flu vaccine    Return in about 3 months (around 12/27/2013) for Diabetes Mellitus.     Holland Commons, NP-C [facility] [phone] 09/27/2013, 10:57 AM

## 2013-11-22 ENCOUNTER — Ambulatory Visit: Payer: Self-pay | Attending: Internal Medicine

## 2013-11-29 ENCOUNTER — Encounter: Payer: Self-pay | Admitting: Internal Medicine

## 2013-12-29 ENCOUNTER — Encounter: Payer: Self-pay | Admitting: Obstetrics & Gynecology

## 2014-02-17 ENCOUNTER — Ambulatory Visit: Payer: Managed Care, Other (non HMO) | Attending: Internal Medicine | Admitting: Internal Medicine

## 2014-02-17 ENCOUNTER — Encounter: Payer: Self-pay | Admitting: Internal Medicine

## 2014-02-17 VITALS — BP 121/82 | HR 86 | Temp 98.8°F | Resp 16 | Ht 60.0 in | Wt 150.0 lb

## 2014-02-17 DIAGNOSIS — E119 Type 2 diabetes mellitus without complications: Secondary | ICD-10-CM | POA: Insufficient documentation

## 2014-02-17 DIAGNOSIS — Z79899 Other long term (current) drug therapy: Secondary | ICD-10-CM | POA: Insufficient documentation

## 2014-02-17 LAB — POCT GLYCOSYLATED HEMOGLOBIN (HGB A1C): HEMOGLOBIN A1C: 7.5

## 2014-02-17 LAB — GLUCOSE, POCT (MANUAL RESULT ENTRY): POC GLUCOSE: 184 mg/dL — AB (ref 70–99)

## 2014-02-17 MED ORDER — METFORMIN HCL 500 MG PO TABS: 500.0000 mg | ORAL_TABLET | Freq: Two times a day (BID) | ORAL | Status: DC

## 2014-02-17 NOTE — Progress Notes (Signed)
Pt is here following up on her diabetes. Pt states that she has not taking any medications for a month. Pt has an interpreter.

## 2014-02-17 NOTE — Progress Notes (Signed)
Patient ID: Veronica Love, female   DOB: 1985/06/16, 29 y.o.   MRN: [id number] SUBJECTIVE: 29 y.o. female for follow up of diabetes. Diabetic Review of Systems - medication compliance: Has been out of her medication for one month because she has been unable to afford the medicine, diabetic diet compliance: compliant most of the time, home glucose monitoring: is not performed, further diabetic ROS: no polyuria or polydipsia, no chest pain, dyspnea or TIA's, no numbness, tingling or pain in extremities, blurry vision.  Last pap smear was 2015 and it was normal.   Current Outpatient Prescriptions  Medication Sig Dispense Refill  . ACCU-CHEK FASTCLIX LANCETS MISC 1 each by Percutaneous route 4 (four) times daily. (Patient not taking: Reported on 02/17/2014) 102 each 12  . etonogestrel (NEXPLANON) 68 MG IMPL implant Inject 1 each (68 mg total) into the skin once. (Patient not taking: Reported on 02/17/2014) 1 each 0  . etonogestrel (NEXPLANON) 68 MG IMPL implant Inject 1 each (68 mg total) into the skin once. (Patient not taking: Reported on 02/17/2014) 1 each 0  . glucose blood test strip Use as instructed (Patient not taking: Reported on 02/17/2014) 100 each 12  . metFORMIN (GLUCOPHAGE) 1000 MG tablet Take 1 tablet (1,000 mg total) by mouth 2 (two) times daily with a meal. Take 0.5 tablets (500 mg) twice daily for 2 weeks, then take 1 tablet (1,000mg ) twice daily after (Patient not taking: Reported on 02/17/2014) 180 tablet 3  . Prenatal Vit-Fe Fumarate-FA (PREPLUS) 27-1 MG TABS TAKE 1 TABLET BY MOUTH EVERY DAY (Patient not taking: Reported on 02/17/2014) 30 tablet 0   No current facility-administered medications for this visit.    OBJECTIVE: Appearance: alert, well appearing, and in no distress and oriented to person, place, and time. BP 121/82 mmHg  Pulse 86  Temp(Src) 98.8 F (37.1 C) (Oral)  Resp 16  Ht 5' (1.524 m)  Wt 150 lb (68.04 kg)  BMI 29.30 kg/m2  SpO2 99%  Exam: heart sounds normal rate,  regular rhythm, normal S1, S2, no murmurs, rubs, clicks or gallops, normal bilateral carotid upstroke without bruits, no JVD, no hepatosplenomegaly, no carotid bruits, feet: warm, good capillary refill, normal DP and PT pulses, normal monofilament exam and normal sensory exam  ASSESSMENT: Diabetes Mellitus: improved  PLAN: See orders for this visit as documented in the electronic medical record. Issues reviewed with her: low cholesterol diet, weight control and daily exercise discussed.  Veronica Love was seen today for follow-up.  Diagnoses and associated orders for this visit:  Type 2 diabetes mellitus without complication - Glucose (CBG) - HgB A1c - Refill metFORMIN (GLUCOPHAGE) 500 MG tablet; Take 1 tablet (500 mg total) by mouth 2 (two) times daily with a meal. - Microalbumin, urine  Due to language barrier, an interpreter was present during the history-taking and subsequent discussion (and for part of the physical exam) with this patient.  Return in about 3 months (around 05/19/2014) for Diabetes Mellitus.

## 2014-02-18 LAB — MICROALBUMIN, URINE: MICROALB UR: 1.3 mg/dL (ref <2.0)

## 2014-03-25 ENCOUNTER — Encounter (HOSPITAL_COMMUNITY): Payer: Self-pay | Admitting: Emergency Medicine

## 2014-03-25 ENCOUNTER — Emergency Department (INDEPENDENT_AMBULATORY_CARE_PROVIDER_SITE_OTHER)
Admission: EM | Admit: 2014-03-25 | Discharge: 2014-03-25 | Disposition: A | Discharge status: Home / Self Care | Payer: Managed Care, Other (non HMO) | Source: Home / Self Care | Attending: Emergency Medicine | Admitting: Emergency Medicine

## 2014-03-25 DIAGNOSIS — L509 Urticaria, unspecified: Secondary | ICD-10-CM

## 2014-03-25 MED ORDER — CETIRIZINE HCL 10 MG PO TABS: 10.0000 mg | ORAL_TABLET | Freq: Every day | ORAL | Status: DC

## 2014-03-25 MED ORDER — METHYLPREDNISOLONE SODIUM SUCC 125 MG IJ SOLR
INTRAMUSCULAR | Status: AC
  Filled 2014-03-25: qty 2

## 2014-03-25 MED ORDER — METHYLPREDNISOLONE SODIUM SUCC 125 MG IJ SOLR
125.0000 mg | Freq: Once | INTRAMUSCULAR | Status: AC
  Administered 2014-03-25: 125 mg via INTRAMUSCULAR

## 2014-03-25 MED ORDER — PREDNISONE 10 MG PO TABS: ORAL_TABLET | ORAL | Status: DC

## 2014-03-25 NOTE — ED Provider Notes (Signed)
CSN: [id number]     Arrival date & time 03/25/14  5621 History   First MD Initiated Contact with Patient 03/25/14 [phone]     Chief Complaint  Patient presents with  . Urticaria   (Consider location/radiation/quality/duration/timing/severity/associated sxs/prior Treatment) HPI  She is a 29 year old woman here for evaluation of rash. She developed an itchy rash on Wednesday. It is located all over her body. It is not getting any better or any worse. No shortness of breath or oral swelling. She denies any new medications, new lotions or soaps, new foods. She has not tried any medications.  Past Medical History  Diagnosis Date  . No pertinent past medical history   . Gestational diabetes 2013  . History of positive PPD 2009    with negative chest x-ray   Past Surgical History  Procedure Laterality Date  . No past surgeries     Family History  Problem Relation Age of Onset  . Diabetes Father    History  Substance Use Topics  . Smoking status: Never Smoker   . Smokeless tobacco: Never Used  . Alcohol Use: No   OB History    Gravida Para Term Preterm AB TAB SAB Ectopic Multiple Living   Review of Systems  HENT: Negative.   Respiratory: Negative for shortness of breath.   Skin: Positive for rash.    Allergies  Review of patient's allergies indicates no known allergies.  Home Medications   Prior to Admission medications   Medication Sig Start Date End Date Taking? Authorizing Provider  ACCU-CHEK FASTCLIX LANCETS MISC 1 each by Percutaneous route 4 (four) times daily. Patient not taking: Reported on 02/17/2014 01/11/13   Lesly Dukes, MD  cetirizine (ZYRTEC) 10 MG tablet Take 1 tablet (10 mg total) by mouth daily. 03/25/14   Charm Rings, MD  etonogestrel (NEXPLANON) 68 MG IMPL implant Inject 1 each (68 mg total) into the skin once. Patient not taking: Reported on 02/17/2014 04/26/13   Minta Balsam, MD  etonogestrel (NEXPLANON) 68 MG IMPL implant Inject  1 each (68 mg total) into the skin once. Patient not taking: Reported on 02/17/2014 04/26/13   Minta Balsam, MD  glucose blood test strip Use as instructed Patient not taking: Reported on 02/17/2014 01/11/13   Lesly Dukes, MD  metFORMIN (GLUCOPHAGE) 500 MG tablet Take 1 tablet (500 mg total) by mouth 2 (two) times daily with a meal. 02/17/14   Ambrose Finland, NP  predniSONE (DELTASONE) 10 MG tablet Take 4 tablets for 3 days, then 3 tablets for 3 days, then 2 tablets for 3 days, then 1 tablet for 3 days. 03/25/14   Charm Rings, MD   BP 100/65 mmHg  Pulse 80  Temp(Src) 98.1 F (36.7 C) (Oral)  Resp 16  SpO2 98% Physical Exam  Constitutional: She is oriented to person, place, and time. She appears well-developed and well-nourished. She appears distressed (looks uncomfortable).  Cardiovascular: Normal rate.   Pulmonary/Chest: Effort normal.  Neurological: She is alert and oriented to person, place, and time.  Skin:  Diffuse urticaria.    ED Course  Procedures (including critical care time) Labs Review Labs Reviewed - No data to display  Imaging Review No results found.   MDM   1. Urticaria    Solu-Medrol 125 mg IM given.  I'm unclear on the etiology of her urticaria. There are no obvious triggers. Will treat  with a prednisone taper and Zyrtec. Recommended Benadryl every 4 hours as needed for itching. Follow-up if hives do not resolve or recur after the prednisone taper. Reviewed warning signs as in after visit summary.    Charm RingsErin J Parv Manthey, MD 03/25/14 [phone]

## 2014-03-25 NOTE — ED Notes (Signed)
Pt is here today because she has had hives and been itching since Wednesday, pt is unsure what may have caused the hives and has not taken any medication to treat this, pt denies any pain at this time

## 2014-03-25 NOTE — Discharge Instructions (Signed)
You are having an allergic reaction to something. Takes cetirizine daily for the next 2-3 months. Take prednisone 4 tablets daily for 3 days, then 3 tablets daily for 3 days, then 2 tablets daily for 3 days, then 1 tablet daily for 3 days. You can also take Benadryl every 4 hours as needed for itching. This medicine will make you sleepy. If your rash does not go away, but comes back after stopping the prednisone, please come back. If you develop trouble breathing or swelling of your lips or tongue please go to the emergency room.

## 2014-11-30 ENCOUNTER — Encounter: Payer: Self-pay | Admitting: Internal Medicine

## 2014-11-30 ENCOUNTER — Ambulatory Visit: Payer: Medicaid Other | Attending: Internal Medicine | Admitting: Internal Medicine

## 2014-11-30 VITALS — BP 124/73 | HR 72 | Temp 98.0°F | Resp 16 | Wt 145.6 lb

## 2014-11-30 DIAGNOSIS — E119 Type 2 diabetes mellitus without complications: Secondary | ICD-10-CM | POA: Insufficient documentation

## 2014-11-30 DIAGNOSIS — E785 Hyperlipidemia, unspecified: Secondary | ICD-10-CM | POA: Diagnosis not present

## 2014-11-30 LAB — LIPID PANEL
CHOL/HDL RATIO: 6.2 ratio — AB (ref <=5.0)
CHOLESTEROL: 223 mg/dL — AB (ref 125–200)
HDL: 36 mg/dL — ABNORMAL LOW (ref >=46)
LDL Cholesterol: 145 mg/dL — ABNORMAL HIGH (ref <130)
Triglycerides: 211 mg/dL — ABNORMAL HIGH (ref <150)
VLDL: 42 mg/dL — ABNORMAL HIGH (ref <30)

## 2014-11-30 LAB — BASIC METABOLIC PANEL
BUN: 11 mg/dL (ref 7–25)
CHLORIDE: 101 mmol/L (ref 98–110)
CO2: 26 mmol/L (ref 20–31)
Calcium: 9.5 mg/dL (ref 8.6–10.2)
Creat: 0.64 mg/dL (ref 0.50–1.10)
GLUCOSE: 203 mg/dL — AB (ref 65–99)
Potassium: 4.6 mmol/L (ref 3.5–5.3)
SODIUM: 136 mmol/L (ref 135–146)

## 2014-11-30 LAB — GLUCOSE, POCT (MANUAL RESULT ENTRY): POC GLUCOSE: 198 mg/dL — AB (ref 70–99)

## 2014-11-30 LAB — POCT GLYCOSYLATED HEMOGLOBIN (HGB A1C): Hemoglobin A1C: 10.5

## 2014-11-30 MED ORDER — GLUCOSE BLOOD VI STRP
ORAL_STRIP | Status: DC
Start: 2014-11-30 — End: 2017-09-17

## 2014-11-30 MED ORDER — TRUE METRIX METER W/DEVICE KIT: PACK | Status: DC

## 2014-11-30 MED ORDER — CETIRIZINE HCL 10 MG PO TABS: 10.0000 mg | ORAL_TABLET | Freq: Every day | ORAL | Status: DC

## 2014-11-30 MED ORDER — METFORMIN HCL 1000 MG PO TABS: 1000.0000 mg | ORAL_TABLET | Freq: Two times a day (BID) | ORAL | Status: DC

## 2014-11-30 MED ORDER — TRUEPLUS LANCETS 28G MISC: Status: DC

## 2014-11-30 NOTE — Progress Notes (Signed)
Patient here for follow up on her diabetes  And refills on her medication

## 2014-11-30 NOTE — Progress Notes (Signed)
Patient ID: Veronica Love ClientLue H Ratterree, female   DOB: 02-17-1985, 29 y.o.   MRN: [id number] SUBJECTIVE: 29 y.o. female for follow up of diabetes. Diabetic Review of Systems - medication compliance: compliant all of the time, diabetic diet compliance: noncompliant much of the time, east rice with every meal, home glucose monitoring: is not performed, does not own a glucometer, further diabetic ROS: no polyuria or polydipsia, no chest pain, dyspnea or TIA's, no numbness, tingling or pain in extremities, no unusual visual symptoms, no hypoglycemia.  Other symptoms and concerns: sometimes has right leg pain that is achy.  Current Outpatient Prescriptions  Medication Sig Dispense Refill  . ACCU-CHEK FASTCLIX LANCETS MISC 1 each by Percutaneous route 4 (four) times daily. 102 each 12  . cetirizine (ZYRTEC) 10 MG tablet Take 1 tablet (10 mg total) by mouth daily. 30 tablet 3  . etonogestrel (NEXPLANON) 68 MG IMPL implant Inject 1 each (68 mg total) into the skin once. 1 each 0  . glucose blood test strip Use as instructed 100 each 12  . metFORMIN (GLUCOPHAGE) 500 MG tablet Take 1 tablet (500 mg total) by mouth 2 (two) times daily with a meal. 180 tablet 3   No current facility-administered medications for this visit.    OBJECTIVE: Appearance: alert, well appearing, and in no distress, oriented to person, place, and time and overweight. BP 124/73 mmHg  Pulse 72  Temp(Src) 98 F (36.7 C)  Resp 16  Wt 145 lb 9.6 oz (66.044 kg)  SpO2 100%  Exam: heart sounds normal rate, regular rhythm, normal S1, S2, no murmurs, rubs, clicks or gallops, no JVD, chest clear, no hepatosplenomegaly, no carotid bruits, feet: warm, good capillary refill, no trophic changes or ulcerative lesions, normal DP and PT pulses, normal monofilament exam and normal sensory exam  ASSESSMENT: Diabetes Mellitus: poorly controlled, needs improvement and needs to follow diet more regularly  HLD: Patients last lipid panel was really elevated. I have  explained that if it is still elevated on repeat today then she will be placed on statin therapy. Went over risk associated with elevated cholesterol and being a diabetic. Patient verbalizes understanding.   PLAN: See orders for this visit as documented in the electronic medical record. Issues reviewed with her: diabetic diet discussed in detail, written exchange diet given, low cholesterol diet, weight control and daily exercise discussed, home glucose monitoring emphasized, glucose meter dispensed to patient, foot care discussed and Podiatry visits discussed and annual eye examinations at Ophthalmology discussed.    Return in about 3 months (around 03/02/2015) for Diabetes Mellitus.    Ambrose FinlandValerie A Kawehi Hostetter, NP 11/30/2014 10:47 AM

## 2014-12-02 ENCOUNTER — Other Ambulatory Visit: Payer: Self-pay | Admitting: Internal Medicine

## 2014-12-02 DIAGNOSIS — E785 Hyperlipidemia, unspecified: Secondary | ICD-10-CM

## 2014-12-02 MED ORDER — ATORVASTATIN CALCIUM 20 MG PO TABS: 20.0000 mg | ORAL_TABLET | Freq: Every day | ORAL | Status: DC

## 2014-12-05 ENCOUNTER — Telehealth: Payer: Self-pay

## 2014-12-05 NOTE — Telephone Encounter (Signed)
-----   Message from Ambrose FinlandValerie A Keck, NP sent at 12/02/2014  8:56 AM EDT ----- Cholesterol is really elevated, no change from last year. Please go over things that increase cholesterol levels such as breads pasta, rice, butters, fried foods, etc. I have prescribed her Lipitor 20 mg to take every evening with dinner. Please explain that high cholesterol places her at risk for stroke and heart disease

## 2014-12-05 NOTE — Telephone Encounter (Signed)
Attempted to contact patient through language line Language line does not offer this dialect Unable to contact patient at this time

## 2015-03-06 MED FILL — ?CETIRIZINE HCL 10 MG TABLE: 10 | 30 days supply | Qty: 30 | Fill #1

## 2015-04-24 ENCOUNTER — Encounter: Payer: Self-pay | Admitting: Internal Medicine

## 2015-04-24 ENCOUNTER — Ambulatory Visit: Payer: Medicaid Other | Attending: Internal Medicine | Admitting: Internal Medicine

## 2015-04-24 VITALS — BP 117/80 | HR 81 | Temp 98.1°F | Resp 16 | Ht 60.0 in | Wt 148.0 lb

## 2015-04-24 DIAGNOSIS — Z7984 Long term (current) use of oral hypoglycemic drugs: Secondary | ICD-10-CM | POA: Insufficient documentation

## 2015-04-24 DIAGNOSIS — Z9114 Patient's other noncompliance with medication regimen: Secondary | ICD-10-CM

## 2015-04-24 DIAGNOSIS — E785 Hyperlipidemia, unspecified: Secondary | ICD-10-CM | POA: Insufficient documentation

## 2015-04-24 DIAGNOSIS — E119 Type 2 diabetes mellitus without complications: Secondary | ICD-10-CM

## 2015-04-24 DIAGNOSIS — Z79899 Other long term (current) drug therapy: Secondary | ICD-10-CM | POA: Insufficient documentation

## 2015-04-24 LAB — POCT URINALYSIS DIPSTICK
BILIRUBIN UA: NEGATIVE
Blood, UA: NEGATIVE
GLUCOSE UA: 500
LEUKOCYTES UA: NEGATIVE
Nitrite, UA: NEGATIVE
Protein, UA: NEGATIVE
Spec Grav, UA: 1.015
Urobilinogen, UA: 0.2
pH, UA: 7

## 2015-04-24 LAB — GLUCOSE, POCT (MANUAL RESULT ENTRY)
POC GLUCOSE: 394 mg/dL — AB (ref 70–99)
POC Glucose: 362 mg/dl — AB (ref 70–99)

## 2015-04-24 LAB — POCT GLYCOSYLATED HEMOGLOBIN (HGB A1C): Hemoglobin A1C: 11.3

## 2015-04-24 MED ORDER — CANAGLIFLOZIN 100 MG PO TABS: 100.0000 mg | ORAL_TABLET | Freq: Every day | ORAL | Status: DC

## 2015-04-24 MED ORDER — INSULIN ASPART 100 UNIT/ML ~~LOC~~ SOLN: 20.0000 [IU] | Freq: Once | SUBCUTANEOUS | Status: DC

## 2015-04-24 NOTE — Progress Notes (Signed)
Patient ID: Veronica Love, female   DOB: Jul 04, 1985, 30 y.o.   MRN: [id number]  CC: Diabetes follow up  HPI: Veronica Love is a 30 y.o. female here today for diabetes follow up.  Patient has past medical history of dyslipidemia and DM II.  Patient states she is feeling well.  Patient states she checks her blood sugars "sometimes" with the last reading of >200 last week.  Patient states she is takes her medications, but forgets often.  Patient denies any polyuria, polydipsia, or neuropathy.    No Known Allergies Past Medical History  Diagnosis Date  . No pertinent past medical history   . Gestational diabetes 2013  . History of positive PPD 2009    with negative chest x-ray   Current Outpatient Prescriptions on File Prior to Visit  Medication Sig Dispense Refill  . atorvastatin (LIPITOR) 20 MG tablet Take 1 tablet (20 mg total) by mouth daily. 90 tablet 3  . Blood Glucose Monitoring Suppl (TRUE METRIX METER) W/DEVICE KIT Check sugars twice per day for E11.9 1 kit 0  . cetirizine (ZYRTEC) 10 MG tablet Take 1 tablet (10 mg total) by mouth daily. 30 tablet 4  . etonogestrel (NEXPLANON) 68 MG IMPL implant Inject 1 each (68 mg total) into the skin once. 1 each 0  . glucose blood (TRUE METRIX BLOOD GLUCOSE TEST) test strip Check sugars twice per day for E11.9 100 each 12  . metFORMIN (GLUCOPHAGE) 1000 MG tablet Take 1 tablet (1,000 mg total) by mouth 2 (two) times daily with a meal. 60 tablet 4  . TRUEPLUS LANCETS 28G MISC Check sugars twice per day for E11.9 100 each 12   No current facility-administered medications on file prior to visit.   Family History  Problem Relation Age of Onset  . Diabetes Father    Social History   Social History  . Marital Status: Married    Spouse Name: N/A  . Number of Children: N/A  . Years of Education: N/A   Occupational History  . Not on file.   Social History Main Topics  . Smoking status: Never Smoker   . Smokeless tobacco: Never Used  . Alcohol Use: No   . Drug Use: No  . Sexual Activity: Yes   Other Topics Concern  . Not on file   Social History Narrative    Review of Systems: Other than what is stated in HPI, all other systems are negative.   Objective:   Filed Vitals:   04/24/15 1614  BP: 117/80  Pulse: 81  Temp: 98.1 F (36.7 C)  Resp: 16    Physical Exam: Constitutional: Patient appears well-developed and well-nourished. No distress. HENT: Normocephalic, atraumatic, External right and left ear normal. Oropharynx is clear and moist.  Eyes: Conjunctivae and EOM are normal.  Neck: Normal ROM. Neck supple. No JVD.  CVS: RRR, S1/S2 +, no murmurs, no gallops, no carotid bruit.  Pulmonary: Effort and breath sounds normal, no stridor, rhonchi, wheezes, rales.  Abdominal: no distension Musculoskeletal: Normal range of motion. No edema and no tenderness.  Neuro: Alert.  Skin: Skin is warm and dry. No rash noted. Not diaphoretic. No erythema. No pallor.   Lab Results  Component Value Date   WBC 5.2 09/27/2013   HGB 13.7 09/27/2013   HCT 40.8 09/27/2013   MCV 72.6* 09/27/2013   PLT 214 09/27/2013   Lab Results  Component Value Date   CREATININE 0.64 11/30/2014   BUN 11 11/30/2014   NA 136  11/30/2014   K 4.6 11/30/2014   CL 101 11/30/2014   CO2 26 11/30/2014    Lab Results  Component Value Date   HGBA1C 11.3 04/24/2015   Lipid Panel     Component Value Date/Time   CHOL 223* 11/30/2014 1053   TRIG 211* 11/30/2014 1053   HDL 36* 11/30/2014 1053   CHOLHDL 6.2* 11/30/2014 1053   VLDL 42* 11/30/2014 1053   LDLCALC 145* 11/30/2014 1053       Assessment and plan:   [name] was seen today for diabetes.  Diagnoses and all orders for this visit:  Type 2 diabetes mellitus without complication, unspecified long term insulin use status (HCC) -     Glucose (CBG) -     POCT A1C -     Microalbumin/Creatinine Ratio, Urine -     Glucose (CBG) -     Urinalysis Dipstick -     insulin aspart (novoLOG) injection 20  Units; Inject 0.2 mLs (20 Units total) into the skin once in office. -     canagliflozin (INVOKANA) 100 MG TABS tablet; Take 1 tablet (100 mg total) by mouth daily before breakfast. -     Basic Metabolic Panel; Future -     Lipid panel; Future -     TSH; Future I have helped patient to set a daily alarm on her phone to go off at 8 am and 8 pm to remember to take her medications. Hopefully this will increase compliance and lower her A1C. Patient is against starting insulin so we will try Invokana daily.   Non compliance w medication regimen Patient to check blood sugars twice a day and keep a diary.  Patient to start Ivokana.  Patient educated on increased risk of UTI's and yeast infections.  Patient to clean well and dry thoroughly.  Patient to return in 2 weeks for evaluation.  Patient educated on diabetes and complications.  Patient encouraged to remain compliant.  Next course of action will be to add insulin.     Return in about 2 weeks (around 05/08/2015) for stacy-log review and lab visit, 3 mo PCP DM.   Veronica Love, BSN, RN, NP student [facility] [phone] 04/24/2015, 5:29 PM

## 2015-04-24 NOTE — Progress Notes (Signed)
Patient's here for f/up DM. Patient reports feeling good.  Patient requesting med refills: metformin, cetrizine.  Patient has not taken meds today.

## 2015-04-25 LAB — MICROALBUMIN / CREATININE URINE RATIO
Creatinine, Urine: 59 mg/dL (ref 20–320)
Microalb, Ur: <0.2 mg/dL

## 2015-04-26 MED FILL — INVOKANA 100 MG TABLET: 100 | 30 days supply | Qty: 30 | Fill #0

## 2015-05-10 ENCOUNTER — Encounter: Payer: Self-pay | Admitting: Pharmacist

## 2015-05-10 ENCOUNTER — Ambulatory Visit: Payer: Self-pay | Attending: Internal Medicine | Admitting: Pharmacist

## 2015-05-10 DIAGNOSIS — E119 Type 2 diabetes mellitus without complications: Secondary | ICD-10-CM

## 2015-05-10 NOTE — Patient Instructions (Signed)
Thanks for coming to see us!  Take metformin 1 tablet in the morning and in the evening with food  Follow up in 2 weeks  You are doing a great job!

## 2015-05-10 NOTE — Progress Notes (Signed)
S:    Patient arrives with a [place] interpreter.  Presents for diabetes evaluation, education, and management at the request of Holland CommonsValerie Keck. Patient was referred on 04/24/15.  Patient was last seen by Primary Care Provider on 04/24/15.   Patient reports adherence with medications. She has been using the alarm system to help her remember to take her medications.  Current diabetes medications include: metformin 1000 mg daily and Invokana 100 mg daily.   Patient denies hypoglycemic events.   Patient denies nocturia.  Patient denies neuropathy. Patient denies visual changes. Patient reports self foot exams.    O:  Lab Results  Component Value Date   HGBA1C 11.3 04/24/2015   There were no vitals filed for this visit.  Home fasting CBG: 100s-160s  2 hour post-prandial/random CBG:100s-180s  A/P: Diabetes longstanding currently UNcontrolled based on A1c of 11.3 but currently improved based on home CBGs. Patient denies hypoglycemic events and is able to verbalize appropriate hypoglycemia management plan. Patient reports adherence with medication. Control is suboptimal due to dietary indiscretion and sedentary lifestyle.  Increase metformin to 1000 mg BID (as prescribed). Encouraged patient to take with food to avoid GI upset - offered the XL formulation but patient would like to continue what she is on. Continue Invokana 100 mg daily. Patient's adherence to medications has greatly improved based off of CBG log so encouraged her to keep up the great work!  Next A1C anticipated June 2017.    Written patient instructions provided.  Total time in face to face counseling 20 minutes.   Follow up in Pharmacist Clinic Visit in 2 weeks.   Patient seen with Theresa MulliganMartin Shaughnessy, PharmD Candidate

## 2015-05-24 ENCOUNTER — Ambulatory Visit: Payer: Self-pay | Admitting: Pharmacist

## 2015-05-25 ENCOUNTER — Ambulatory Visit: Payer: Self-pay | Admitting: Pharmacist

## 2015-06-19 MED FILL — ?CETIRIZINE HCL 10 MG TABLE: 10 | 30 days supply | Qty: 30 | Fill #2

## 2015-06-19 MED FILL — TRUE METRIX TEST STRIP: 50 days supply | Qty: 100 | Fill #1

## 2015-06-19 MED FILL — metFORMIN HCL 1000 MG TABS: 1000 | 30 days supply | Qty: 60 | Fill #1

## 2015-06-19 MED FILL — TRUEplus LANCETS 28G MISC: 30 days supply | Qty: 100 | Fill #1

## 2015-07-06 ENCOUNTER — Emergency Department (HOSPITAL_COMMUNITY)
Admission: EM | Admit: 2015-07-06 | Discharge: 2015-07-07 | Disposition: A | Discharge status: Home / Self Care | Payer: No Typology Code available for payment source | Attending: Emergency Medicine | Admitting: Emergency Medicine

## 2015-07-06 ENCOUNTER — Encounter (HOSPITAL_COMMUNITY): Payer: Self-pay

## 2015-07-06 ENCOUNTER — Emergency Department (HOSPITAL_COMMUNITY): Payer: No Typology Code available for payment source

## 2015-07-06 DIAGNOSIS — Y9389 Activity, other specified: Secondary | ICD-10-CM | POA: Diagnosis not present

## 2015-07-06 DIAGNOSIS — Z8632 Personal history of gestational diabetes: Secondary | ICD-10-CM | POA: Diagnosis not present

## 2015-07-06 DIAGNOSIS — S0083XA Contusion of other part of head, initial encounter: Secondary | ICD-10-CM | POA: Diagnosis not present

## 2015-07-06 DIAGNOSIS — S9031XA Contusion of right foot, initial encounter: Secondary | ICD-10-CM | POA: Insufficient documentation

## 2015-07-06 DIAGNOSIS — S301XXA Contusion of abdominal wall, initial encounter: Secondary | ICD-10-CM | POA: Diagnosis not present

## 2015-07-06 DIAGNOSIS — Y92410 Unspecified street and highway as the place of occurrence of the external cause: Secondary | ICD-10-CM | POA: Insufficient documentation

## 2015-07-06 DIAGNOSIS — Z3202 Encounter for pregnancy test, result negative: Secondary | ICD-10-CM | POA: Insufficient documentation

## 2015-07-06 DIAGNOSIS — Y998 Other external cause status: Secondary | ICD-10-CM | POA: Diagnosis not present

## 2015-07-06 DIAGNOSIS — R109 Unspecified abdominal pain: Secondary | ICD-10-CM

## 2015-07-06 DIAGNOSIS — S3991XA Unspecified injury of abdomen, initial encounter: Secondary | ICD-10-CM | POA: Diagnosis present

## 2015-07-06 LAB — COMPREHENSIVE METABOLIC PANEL
ALT: 26 U/L (ref 14–54)
AST: 25 U/L (ref 15–41)
Albumin: 4.2 g/dL (ref 3.5–5.0)
Alkaline Phosphatase: 57 U/L (ref 38–126)
Anion gap: 8 (ref 5–15)
BUN: 11 mg/dL (ref 6–20)
CHLORIDE: 104 mmol/L (ref 101–111)
CO2: 21 mmol/L — AB (ref 22–32)
CREATININE: 0.71 mg/dL (ref 0.44–1.00)
Calcium: 9.3 mg/dL (ref 8.9–10.3)
GFR calc Af Amer: >60 mL/min (ref >60)
GFR calc non Af Amer: >60 mL/min (ref >60)
Glucose, Bld: 457 mg/dL — ABNORMAL HIGH (ref 65–99)
Potassium: 4.1 mmol/L (ref 3.5–5.1)
SODIUM: 133 mmol/L — AB (ref 135–145)
Total Bilirubin: 0.7 mg/dL (ref 0.3–1.2)
Total Protein: 7.7 g/dL (ref 6.5–8.1)

## 2015-07-06 LAB — URINALYSIS, ROUTINE W REFLEX MICROSCOPIC
BILIRUBIN URINE: NEGATIVE
KETONES UR: 15 mg/dL — AB
Leukocytes, UA: NEGATIVE
Nitrite: NEGATIVE
PROTEIN: NEGATIVE mg/dL
Specific Gravity, Urine: 1.044 — ABNORMAL HIGH (ref 1.005–1.030)
pH: 6 (ref 5.0–8.0)

## 2015-07-06 LAB — CBC
HCT: 38.9 % (ref 36.0–46.0)
Hemoglobin: 12.6 g/dL (ref 12.0–15.0)
MCH: 23.7 pg — AB (ref 26.0–34.0)
MCHC: 32.4 g/dL (ref 30.0–36.0)
MCV: 73.3 fL — AB (ref 78.0–100.0)
PLATELETS: 184 10*3/uL (ref 150–400)
RBC: 5.31 MIL/uL — ABNORMAL HIGH (ref 3.87–5.11)
RDW: 14.2 % (ref 11.5–15.5)
WBC: 12.2 10*3/uL — ABNORMAL HIGH (ref 4.0–10.5)

## 2015-07-06 LAB — I-STAT BETA HCG BLOOD, ED (MC, WL, AP ONLY): I-stat hCG, quantitative: <5 m[IU]/mL (ref <5)

## 2015-07-06 LAB — URINE MICROSCOPIC-ADD ON

## 2015-07-06 LAB — LIPASE, BLOOD: LIPASE: 41 U/L (ref 11–51)

## 2015-07-06 MED ORDER — IOPAMIDOL (ISOVUE-300) INJECTION 61%
INTRAVENOUS | Status: AC
  Administered 2015-07-06: 100 mL
  Filled 2015-07-06: qty 100
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
## 2015-07-06 NOTE — ED Notes (Signed)
Patient transported to X-ray 

## 2015-07-06 NOTE — ED Notes (Signed)
Pt sitting up in chair talking to family. No acute distress noted.
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
## 2015-07-07 ENCOUNTER — Emergency Department (HOSPITAL_COMMUNITY): Payer: No Typology Code available for payment source

## 2015-07-07 MED ORDER — HYDROCODONE-ACETAMINOPHEN 5-325 MG PO TABS: 1.0000 | ORAL_TABLET | ORAL | Status: DC | PRN

## 2015-07-07 MED ORDER — HYDROCODONE-ACETAMINOPHEN 5-325 MG PO TABS
2.0000 | ORAL_TABLET | Freq: Once | ORAL | Status: AC
  Administered 2015-07-07: 2 via ORAL
  Filled 2015-07-07: qty 2
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
## 2015-07-07 NOTE — ED Notes (Signed)
Patient transported to Ultrasound 
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
## 2016-01-16 ENCOUNTER — Other Ambulatory Visit: Payer: Self-pay | Admitting: Internal Medicine

## 2016-01-17 MED FILL — ?CETIRIZINE HCL 10 MG TABLE: 10 | 30 days supply | Qty: 30 | Fill #0
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
## 2017-09-17 ENCOUNTER — Ambulatory Visit: Payer: BLUE CROSS/BLUE SHIELD | Attending: Nurse Practitioner | Admitting: Nurse Practitioner

## 2017-09-17 ENCOUNTER — Encounter: Payer: Self-pay | Admitting: Nurse Practitioner

## 2017-09-17 VITALS — BP 131/83 | HR 77 | Temp 98.7°F | Ht 61.0 in | Wt 138.2 lb

## 2017-09-17 DIAGNOSIS — Z833 Family history of diabetes mellitus: Secondary | ICD-10-CM | POA: Insufficient documentation

## 2017-09-17 DIAGNOSIS — Z7984 Long term (current) use of oral hypoglycemic drugs: Secondary | ICD-10-CM | POA: Insufficient documentation

## 2017-09-17 DIAGNOSIS — E118 Type 2 diabetes mellitus with unspecified complications: Secondary | ICD-10-CM | POA: Diagnosis present

## 2017-09-17 DIAGNOSIS — E782 Mixed hyperlipidemia: Secondary | ICD-10-CM

## 2017-09-17 DIAGNOSIS — Z9111 Patient's noncompliance with dietary regimen: Secondary | ICD-10-CM | POA: Diagnosis not present

## 2017-09-17 DIAGNOSIS — Z79899 Other long term (current) drug therapy: Secondary | ICD-10-CM | POA: Diagnosis not present

## 2017-09-17 LAB — GLUCOSE, POCT (MANUAL RESULT ENTRY): POC GLUCOSE: 248 mg/dL — AB (ref 70–99)

## 2017-09-17 LAB — POCT GLYCOSYLATED HEMOGLOBIN (HGB A1C): HEMOGLOBIN A1C: 12.6 % — AB (ref 4.0–5.6)

## 2017-09-17 MED ORDER — METFORMIN HCL 1000 MG PO TABS: 1000.0000 mg | ORAL_TABLET | Freq: Two times a day (BID) | ORAL | 4 refills | Status: DC

## 2017-09-17 MED ORDER — ATORVASTATIN CALCIUM 20 MG PO TABS: 20.0000 mg | ORAL_TABLET | Freq: Every day | ORAL | 3 refills | Status: DC

## 2017-09-17 MED ORDER — MICROLET NEXT LANCING DEVICE MISC: 1.0000 | Freq: Two times a day (BID) | 3 refills | Status: DC

## 2017-09-17 MED ORDER — CANAGLIFLOZIN 100 MG PO TABS: 100.0000 mg | ORAL_TABLET | Freq: Every day | ORAL | 3 refills | Status: AC

## 2017-09-17 MED ORDER — BAYER CONTOUR MONITOR DEVI: 1.0000 | Freq: Two times a day (BID) | 0 refills | Status: AC

## 2017-09-17 MED ORDER — GLUCOSE BLOOD VI STRP: ORAL_STRIP | 12 refills | Status: DC

## 2017-09-17 MED ORDER — MICROLET LANCETS MISC: 3 refills | Status: DC

## 2017-09-17 MED FILL — INVOKANA 100 MG TABLET: 100 | 30 days supply | Qty: 30 | Fill #0

## 2017-09-17 MED FILL — CONTOUR NEXT METER: W/DEVICE | 30 days supply | Qty: 1 | Fill #0

## 2017-09-17 MED FILL — metFORMIN HCL 1000 MG TABS: 1000 | 30 days supply | Qty: 60 | Fill #0

## 2017-09-17 MED FILL — MICROLET LANCETS MISC: 50 days supply | Qty: 100 | Fill #0

## 2017-09-17 MED FILL — CONTOUR NEXT STRIPS: 25 days supply | Qty: 50 | Fill #0

## 2017-09-17 MED FILL — ATORVASTATIN 20 MG TABLET: 20 | 30 days supply | Qty: 30 | Fill #0

## 2017-09-17 NOTE — Patient Instructions (Signed)
Diabetes blood sugar goals  Fasting in AM before breakfast which means at least 8 hrs of no eating or drinking) except water or unsweetened coffee or tea): 90-130 2 hrs after meals: < 180 Hypoglycemia or low blood sugar: < 70 (You should not have hypoglycemia.)  Aim for 30 minutes of exercise most days. Rethink what you drink. Water is great! Aim for 2-3 Carb Choices per meal (30-45 grams) +/- 1 either way  Aim for 0-15 Carbs per snack if hungry  Include protein in moderation with your meals and snacks  Consider reading food labels for Total Carbohydrate and Fat Grams of foods  Consider checking BG at alternate times per day  Continue taking medication as directed Be mindful about how much sugar you are adding to beverages and other foods. Fruit Punch - find one with no sugar  Measure and decrease portions of carbohydrate foods  Make your plate and don't go back for seconds

## 2017-09-17 NOTE — Progress Notes (Signed)
Assessment & Plan:  [name] was seen today for establish care.  Diagnoses and all orders for this visit:  Type 2 diabetes mellitus with complication, unspecified whether long term insulin use (HCC) -     Glucose (CBG) -     HgB A1c -     Microalbumin/Creatinine Ratio, Urine -     canagliflozin (INVOKANA) 100 MG TABS tablet; Take 1 tablet (100 mg total) by mouth daily before breakfast. -     metFORMIN (GLUCOPHAGE) 1000 MG tablet; Take 1 tablet (1,000 mg total) by mouth 2 (two) times daily with a meal. -     CBC -     CMP14+EGFR -     Lipid panel -     TSH -     glucose blood test strip; Use as instructed. Check blood glucose by fingerstick two times per day -     Lancet Devices (MICROLET NEXT LANCING DEVICE) MISC; 1 kit by Does not apply route 2 (two) times daily. -     MICROLET LANCETS MISC; Use as instructed. Check blood glucose by fingerstick two times per day -     Blood Glucose Monitoring Suppl (CONTOUR BLOOD GLUCOSE SYSTEM) DEVI; 1 kit by Does not apply route 2 (two) times daily. Continue blood sugar control as discussed in office today, low carbohydrate diet, and regular physical exercise as tolerated, 150 minutes per week (30 min each day, 5 days per week, or 50 min 3 days per week). Keep blood sugar logs with fasting goal of 90-130 mg/dl, post prandial (after you eat) less than 180.  For Hypoglycemia: BS <60 and Hyperglycemia BS >400; contact the clinic ASAP. Annual eye exams and foot exams are recommended.   Mixed hyperlipidemia -     atorvastatin (LIPITOR) 20 MG tablet; Take 1 tablet (20 mg total) by mouth daily. INSTRUCTIONS: Work on a low fat, heart healthy diet and participate in regular aerobic exercise program by working out at least 150 minutes per week; 5 days a week-30 minutes per day. Avoid red meat, fried foods. junk foods, sodas, sugary drinks, unhealthy snacking, alcohol and smoking.  Drink at least 48oz of water per day and monitor your carbohydrate intake daily.      Patient has been counseled on age-appropriate routine health concerns for screening and prevention. These are reviewed and up-to-date. Referrals have been placed accordingly. Immunizations are up-to-date or declined.    Subjective:   Chief Complaint  Patient presents with  . Establish Care    Pt. is here to establish care to check for diabetes.    HPI Veronica Love 32 y.o. female presents to office today to establish care for diabetes mellitus type 2. She has an onsite interpreter with her today.  She has not been seen in this office or by any other primary care provider in over 2 years.  She reports being lost to follow-up due to lack of insurance.  Type 2 Diabetes Mellitus Disease course has been worsening.  Blood glucose level is elevated today.  Patient required 20 units of NovoLog here in the office.  There are diabetic complications. Risk factors for coronary artery disease include dyslipidemia, diabetes mellitus. She has not been monitoring her blood sugars at home.  She does not have a glucometer. One will be ordered for her today. We discussed the importance of dietary and exercise modifications today in regards to lowering her blood glucose levels.  A low-dose ACE inhibitor will be ordered today. Patient does not see  a podiatrist. Eye exam is not current. She will be referred to ophthalmology today.  Medications ordered today which patient has not taken in over a year include Invokana 100 mg daily as well as metformin 1000 mg twice daily and Lipitor 20 mg daily. Lab Results  Component Value Date   HGBA1C 12.6 (A) 09/17/2017   Lab Results  Component Value Date   HGBA1C 11.3 (A) 04/24/2015   Hyperlipidemia Patient presents for follow up to hyperlipidemia.  She is not medication compliant and will be started on atorvastatin 20 mg today. She is not diet compliant and denies skin xanthelasma or statin intolerance including myalgias.  Lab Results  Component Value Date   CHOL 247 (H)  09/17/2017   Lab Results  Component Value Date   HDL 42 09/17/2017   Lab Results  Component Value Date   LDLCALC 166 (H) 09/17/2017   Lab Results  Component Value Date   TRIG 194 (H) 09/17/2017   Lab Results  Component Value Date   CHOLHDL 5.9 (H) 09/17/2017   Review of Systems  Constitutional: Negative for fever, malaise/fatigue and weight loss.  HENT: Negative.  Negative for nosebleeds.   Eyes: Negative.  Negative for blurred vision, double vision and photophobia.  Respiratory: Negative.  Negative for cough and shortness of breath.   Cardiovascular: Negative.  Negative for chest pain, palpitations and leg swelling.  Gastrointestinal: Negative.  Negative for heartburn, nausea and vomiting.  Musculoskeletal: Negative.  Negative for myalgias.  Neurological: Negative.  Negative for dizziness, focal weakness, seizures and headaches.  Psychiatric/Behavioral: Negative.  Negative for suicidal ideas.    Past Medical History:  Diagnosis Date  . Gestational diabetes 2013  . 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 No pertinent past medical history     Past Surgical History:  Procedure Laterality Date  . NO PAST SURGERIES      Family History  Problem Relation Age of Onset  . Diabetes Father     Social History Reviewed with no changes to be made today.   Outpatient Medications Prior to Visit  Medication Sig Dispense Refill  . cetirizine (ZYRTEC) 10 MG tablet TAKE 1 TABLET BY MOUTH DAILY. (Patient not taking: Reported on 09/17/2017) 30 tablet 0  . etonogestrel (NEXPLANON) 68 MG IMPL implant Inject 1 each (68 mg total) into the skin once. (Patient not taking: Reported on 07/06/2015) 1 each 0  . atorvastatin (LIPITOR) 20 MG tablet Take 1 tablet (20 mg total) by mouth daily. (Patient not taking: Reported on 07/06/2015) 90 tablet 3  . Blood Glucose Monitoring Suppl (TRUE METRIX METER) W/DEVICE KIT Check sugars twice per day for E11.9 (Patient not taking: Reported  on 07/06/2015) 1 kit 0  . canagliflozin (INVOKANA) 100 MG TABS tablet Take 1 tablet (100 mg total) by mouth daily before breakfast. (Patient not taking: Reported on 07/06/2015) 30 tablet 3  . glucose blood (TRUE METRIX BLOOD GLUCOSE TEST) test strip Check sugars twice per day for E11.9 (Patient not taking: Reported on 07/06/2015) 100 each 12  . HYDROcodone-acetaminophen (NORCO/VICODIN) 5-325 MG tablet Take 1-2 tablets by mouth every 4 (four) hours as needed. (Patient not taking: Reported on 09/17/2017) 6 tablet 0  . metFORMIN (GLUCOPHAGE) 1000 MG tablet Take 1 tablet (1,000 mg total) by mouth 2 (two) times daily with a meal. (Patient not taking: Reported on 07/06/2015) 60 tablet 4  . TRUEPLUS LANCETS 28G MISC Check sugars twice per day for E11.9 (Patient not taking: Reported on  07/06/2015) 100 each 12   Facility-Administered Medications Prior to Visit  Medication Dose Route Frequency Provider Last Rate Last Dose  . insulin aspart (novoLOG) injection 20 Units  20 Units Subcutaneous Once Chari Manning A, NP        No Known Allergies     Objective:    BP 131/83 (BP Location: Left Arm, Patient Position: Sitting, Cuff Size: Normal)   Pulse 77   Temp 98.7 F (37.1 C) (Oral)   Ht _0  (1.549 m)   Wt 138 lb 3.2 oz (62.7 kg)   SpO2 98%   BMI 26.11 kg/m   Wt Readings from Last 3 Encounters:  09/17/17 138 lb 3.2 oz (62.7 kg)  04/24/15 148 lb (67.1 kg)  11/30/14 145 lb 9.6 oz (66 kg)    Physical Exam  Constitutional: She is oriented to person, place, and time. She appears well-developed and well-nourished. She is cooperative.  HENT:  Head: Normocephalic and atraumatic.  Eyes: EOM are normal.  Neck: Normal range of motion.  Cardiovascular: Normal rate, regular rhythm and normal heart sounds. Exam reveals no gallop and no friction rub.  No murmur heard. Pulmonary/Chest: Effort normal and breath sounds normal. No tachypnea. No respiratory distress. She has no decreased breath sounds. She has no  wheezes. She has no rhonchi. She has no rales. She exhibits no tenderness.  Abdominal: Bowel sounds are normal.  Musculoskeletal: Normal range of motion. She exhibits no edema.  Neurological: She is alert and oriented to person, place, and time. Coordination normal.  Skin: Skin is warm and dry.  Psychiatric: She has a normal mood and affect. Her behavior is normal. Judgment and thought content normal.  Nursing note and vitals reviewed.      Patient has been counseled extensively about nutrition and exercise as well as the importance of adherence with medications and regular follow-up. The patient was given clear instructions to go to ER or return to medical center if symptoms don't improve, worsen or new problems develop. The patient verbalized understanding.   Follow-up: Return in about 4 weeks (around 10/15/2017) for please see below. To f/u Blood glucose levels. She was instructed to bring her [place], FNP-BC [facility] and [facility] [place], [place]   09/25/2017, 5:44 PM

## 2017-09-18 ENCOUNTER — Other Ambulatory Visit: Payer: Self-pay | Admitting: Nurse Practitioner

## 2017-09-18 DIAGNOSIS — R7989 Other specified abnormal findings of blood chemistry: Secondary | ICD-10-CM

## 2017-09-18 LAB — CMP14+EGFR
ALBUMIN: 4.8 g/dL (ref 3.5–5.5)
ALT: 16 IU/L (ref 0–32)
AST: 12 IU/L (ref 0–40)
Albumin/Globulin Ratio: 1.5 (ref 1.2–2.2)
Alkaline Phosphatase: 69 IU/L (ref 39–117)
BUN / CREAT RATIO: 15 (ref 9–23)
BUN: 10 mg/dL (ref 6–20)
Bilirubin Total: 0.4 mg/dL (ref 0.0–1.2)
CALCIUM: 10 mg/dL (ref 8.7–10.2)
CO2: 22 mmol/L (ref 20–29)
CREATININE: 0.65 mg/dL (ref 0.57–1.00)
Chloride: 97 mmol/L (ref 96–106)
GFR calc Af Amer: 134 mL/min/{1.73_m2} (ref >59)
GFR, EST NON AFRICAN AMERICAN: 116 mL/min/{1.73_m2} (ref >59)
GLUCOSE: 234 mg/dL — AB (ref 65–99)
Globulin, Total: 3.3 g/dL (ref 1.5–4.5)
Potassium: 4.2 mmol/L (ref 3.5–5.2)
Sodium: 136 mmol/L (ref 134–144)
Total Protein: 8.1 g/dL (ref 6.0–8.5)

## 2017-09-18 LAB — CBC
HEMATOCRIT: 45.5 % (ref 34.0–46.6)
HEMOGLOBIN: 14.7 g/dL (ref 11.1–15.9)
MCH: 24.3 pg — ABNORMAL LOW (ref 26.6–33.0)
MCHC: 32.3 g/dL (ref 31.5–35.7)
MCV: 75 fL — ABNORMAL LOW (ref 79–97)
Platelets: 231 10*3/uL (ref 150–450)
RBC: 6.04 x10E6/uL — ABNORMAL HIGH (ref 3.77–5.28)
RDW: 14.9 % (ref 12.3–15.4)
WBC: 6.8 10*3/uL (ref 3.4–10.8)

## 2017-09-18 LAB — LIPID PANEL
CHOL/HDL RATIO: 5.9 ratio — AB (ref 0.0–4.4)
CHOLESTEROL TOTAL: 247 mg/dL — AB (ref 100–199)
HDL: 42 mg/dL (ref >39)
LDL CALC: 166 mg/dL — AB (ref 0–99)
TRIGLYCERIDES: 194 mg/dL — AB (ref 0–149)
VLDL CHOLESTEROL CAL: 39 mg/dL (ref 5–40)

## 2017-09-18 LAB — MICROALBUMIN / CREATININE URINE RATIO: Creatinine, Urine: 26.4 mg/dL

## 2017-09-18 LAB — TSH: TSH: 0.232 u[IU]/mL — ABNORMAL LOW (ref 0.450–4.500)

## 2017-09-19 ENCOUNTER — Telehealth: Payer: Self-pay

## 2017-09-19 NOTE — Telephone Encounter (Signed)
-----   Message from Claiborne RiggZelda W Fleming, NP sent at 09/18/2017 10:35 PM EDT ----- Cholesterol levels are elevated. This increases the risk of stroke or heart attack. Make sure you take your cholesterol medicine atorvastatin as prescribed. Your thyroid level is abnormal as well. Please schedule a lab visit on the same day that you are scheduled to see the pharmacist 10-15-2017.

## 2017-09-19 NOTE — Telephone Encounter (Signed)
CMA attempt to call patient to inform on results.  No answer and left a VM for patient to call back.   If patient call back, please inform:  Cholesterol levels are elevated. This increases the risk of stroke or heart attack. Make sure you take your cholesterol medicine atorvastatin as prescribed. Your thyroid level is abnormal as well. Please schedule a lab visit on the same day that you are scheduled to see the pharmacist 10-15-2017.  A letter will be send out to reach patient.

## 2017-09-25 ENCOUNTER — Encounter: Payer: Self-pay | Admitting: Nurse Practitioner

## 2017-09-25 MED ORDER — LISINOPRIL 5 MG PO TABS: 5.0000 mg | ORAL_TABLET | Freq: Every day | ORAL | 3 refills | Status: DC

## 2017-10-15 ENCOUNTER — Ambulatory Visit: Payer: BLUE CROSS/BLUE SHIELD | Admitting: Pharmacist

## 2017-10-21 MED FILL — metFORMIN HCL 1000 MG TABS: 1000 | 30 days supply | Qty: 60 | Fill #1

## 2017-10-21 MED FILL — CONTOUR NEXT STRIPS: 25 days supply | Qty: 50 | Fill #1

## 2017-10-21 MED FILL — ATORVASTATIN 20 MG TABLET: 20 | 30 days supply | Qty: 30 | Fill #1

## 2017-10-21 MED FILL — INVOKANA 100 MG TABLET: 100 | 30 days supply | Qty: 30 | Fill #1

## 2017-10-31 ENCOUNTER — Encounter: Payer: BLUE CROSS/BLUE SHIELD | Admitting: Nurse Practitioner

## 2017-11-05 ENCOUNTER — Other Ambulatory Visit (HOSPITAL_COMMUNITY)
Admission: RE | Admit: 2017-11-05 | Discharge: 2017-11-05 | Disposition: A | Discharge status: Home / Self Care | Payer: BLUE CROSS/BLUE SHIELD | Source: Ambulatory Visit | Attending: Nurse Practitioner | Admitting: Nurse Practitioner

## 2017-11-05 ENCOUNTER — Encounter: Payer: Self-pay | Admitting: Nurse Practitioner

## 2017-11-05 ENCOUNTER — Ambulatory Visit (HOSPITAL_BASED_OUTPATIENT_CLINIC_OR_DEPARTMENT_OTHER): Payer: BLUE CROSS/BLUE SHIELD | Admitting: Nurse Practitioner

## 2017-11-05 VITALS — BP 136/83 | HR 83 | Temp 98.6°F | Ht 61.0 in | Wt 139.4 lb

## 2017-11-05 DIAGNOSIS — Z01419 Encounter for gynecological examination (general) (routine) without abnormal findings: Secondary | ICD-10-CM | POA: Diagnosis not present

## 2017-11-05 DIAGNOSIS — D734 Cyst of spleen: Secondary | ICD-10-CM | POA: Diagnosis not present

## 2017-11-05 DIAGNOSIS — Z124 Encounter for screening for malignant neoplasm of cervix: Secondary | ICD-10-CM | POA: Insufficient documentation

## 2017-11-05 DIAGNOSIS — J301 Allergic rhinitis due to pollen: Secondary | ICD-10-CM

## 2017-11-05 LAB — GLUCOSE, POCT (MANUAL RESULT ENTRY): POC GLUCOSE: 227 mg/dL — AB (ref 70–99)

## 2017-11-05 MED ORDER — CETIRIZINE HCL 10 MG PO TABS: 10.0000 mg | ORAL_TABLET | Freq: Every day | ORAL | 3 refills | Status: DC

## 2017-11-05 NOTE — Progress Notes (Signed)
Assessment & Plan:  Veronica Love was seen today for annual exam.  Diagnoses and all orders for this visit:  Encounter for well woman exam with routine gynecological exam -     Glucose (CBG) -     Ambulatory referral to Ophthalmology  Encounter for Papanicolaou smear for cervical cancer screening -     Cytology - PAP  Cyst of spleen -     MR Abdomen W Wo Contrast; Future Patient had abnormal CT of abdomen 2 years ago.   Seasonal allergic rhinitis due to pollen -     cetirizine (ZYRTEC) 10 MG tablet; Take 1 tablet (10 mg total) by mouth daily.    Patient has been counseled on age-appropriate routine health concerns for screening and prevention. These are reviewed and up-to-date. Referrals have been placed accordingly. Immunizations are up-to-date or declined.    Subjective:   Chief Complaint  Patient presents with  . Annual Exam    Pt. is here for a physical.    HPI Veronica Love 32 y.o. female presents to office today for well woman exam with PAP. An onsite interpreter is accompanying her today.   Review of Systems  Constitutional: Negative.  Negative for chills, fever, malaise/fatigue and weight loss.  HENT: Negative.  Negative for congestion, hearing loss, sinus pain and sore throat.   Eyes: Negative.  Negative for blurred vision, double vision, photophobia and pain.  Respiratory: Negative.  Negative for cough, sputum production, shortness of breath and wheezing.   Cardiovascular: Negative.  Negative for chest pain and leg swelling.  Gastrointestinal: Negative.  Negative for abdominal pain, constipation, diarrhea, heartburn, nausea and vomiting.  Genitourinary: Negative.   Musculoskeletal: Negative.  Negative for joint pain and myalgias.  Skin: Negative.  Negative for rash.  Neurological: Negative.  Negative for dizziness, tremors, speech change, focal weakness, seizures and headaches.  Endo/Heme/Allergies: Positive for environmental allergies.  Psychiatric/Behavioral: Negative.   Negative for depression and suicidal ideas. The patient is not nervous/anxious and does not have insomnia.     Past Medical History:  Diagnosis Date  . Gestational diabetes 2013  . 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 No pertinent past medical history     Past Surgical History:  Procedure Laterality Date  . NO PAST SURGERIES      Family History  Problem Relation Age of Onset  . Diabetes Father     Social History Reviewed with no changes to be made today.   Outpatient Medications Prior to Visit  Medication Sig Dispense Refill  . atorvastatin (LIPITOR) 20 MG tablet Take 1 tablet (20 mg total) by mouth daily. 90 tablet 3  . canagliflozin (INVOKANA) 100 MG TABS tablet Take 1 tablet (100 mg total) by mouth daily before breakfast. 90 tablet 3  . glucose blood test strip Use as instructed. Check blood glucose by fingerstick two times per day 100 each 12  . Lancet Devices (MICROLET NEXT LANCING DEVICE) MISC 1 kit by Does not apply route 2 (two) times daily. 100 each 3  . metFORMIN (GLUCOPHAGE) 1000 MG tablet Take 1 tablet (1,000 mg total) by mouth 2 (two) times daily with a meal. 180 tablet 4  . MICROLET LANCETS MISC Use as instructed. Check blood glucose by fingerstick two times per day 100 each 3  . etonogestrel (NEXPLANON) 68 MG IMPL implant Inject 1 each (68 mg total) into the skin once. (Patient not taking: Reported on 07/06/2015) 1 each 0  . lisinopril (PRINIVIL,ZESTRIL)  5 MG tablet Take 1 tablet (5 mg total) by mouth daily. 90 tablet 3  . cetirizine (ZYRTEC) 10 MG tablet TAKE 1 TABLET BY MOUTH DAILY. (Patient not taking: Reported on 09/17/2017) 30 tablet 0   Facility-Administered Medications Prior to Visit  Medication Dose Route Frequency Provider Last Rate Last Dose  . insulin aspart (novoLOG) injection 20 Units  20 Units Subcutaneous Once Chari Manning A, NP        No Known Allergies     Objective:    BP 136/83 (BP Location: Left Arm, Patient  Position: Sitting, Cuff Size: Normal)   Pulse 83   Temp 98.6 F (37 C) (Oral)   Ht _0  (1.549 m)   Wt 139 lb 6.4 oz (63.2 kg)   SpO2 100%   BMI 26.34 kg/m  Wt Readings from Last 3 Encounters:  11/05/17 139 lb 6.4 oz (63.2 kg)  09/17/17 138 lb 3.2 oz (62.7 kg)  04/24/15 148 lb (67.1 kg)    Physical Exam  Constitutional: She is oriented to person, place, and time. She appears well-developed and well-nourished. No distress.  HENT:  Head: Normocephalic and atraumatic.  Right Ear: Hearing, tympanic membrane, external ear and ear canal normal.  Left Ear: Hearing, tympanic membrane, external ear and ear canal normal.  Nose: Nose normal. No mucosal edema or rhinorrhea.  Mouth/Throat: Uvula is midline, oropharynx is clear and moist and mucous membranes are normal. No oropharyngeal exudate. Tonsils are 1+ on the right. Tonsils are 1+ on the left.  Eyes: Pupils are equal, round, and reactive to light. Conjunctivae, EOM and lids are normal. Right eye exhibits no discharge. Left eye exhibits no discharge. No scleral icterus.  Neck: Normal range of motion. Neck supple. No tracheal deviation present. No thyromegaly present.  Cardiovascular: Normal rate, regular rhythm, normal heart sounds and intact distal pulses. Exam reveals no friction rub.  No murmur heard. Pulses:      Dorsalis pedis pulses are 2+ on the right side, and 2+ on the left side.       Posterior tibial pulses are 2+ on the right side, and 2+ on the left side.  Pulmonary/Chest: Effort normal and breath sounds normal. No respiratory distress. She has no decreased breath sounds. She has no wheezes. She has no rhonchi. She has no rales. She exhibits no tenderness. Right breast exhibits no inverted nipple, no mass, no nipple discharge, no skin change and no tenderness. Left breast exhibits no inverted nipple, no mass, no nipple discharge, no skin change and no tenderness. No breast swelling, tenderness, discharge or bleeding. Breasts are  asymmetrical.  Abdominal: Soft. Bowel sounds are normal. She exhibits no distension and no mass. There is no tenderness. There is no rebound and no guarding. Hernia confirmed negative in the right inguinal area and confirmed negative in the left inguinal area.  Genitourinary: Vagina normal. Rectal exam shows no external hemorrhoid, no fissure, no mass and anal tone normal. No labial fusion. There is no rash, tenderness, lesion or injury on the right labia. There is no rash, tenderness, lesion or injury on the left labia. Uterus is not tender. Cervix exhibits no motion tenderness, no discharge and no friability. Right adnexum displays no mass, no tenderness and no fullness. Left adnexum displays no mass, no tenderness and no fullness. No erythema, tenderness or bleeding in the vagina. No vaginal discharge found.  Musculoskeletal: Normal range of motion. She exhibits no edema, tenderness or deformity.       Right foot: There  is normal range of motion and no deformity.       Left foot: There is normal range of motion and no deformity.  Feet:  Right Foot:  Protective Sensation: 10 sites tested. 10 sites sensed.  Skin Integrity: Positive for dry skin. Negative for callus.  Left Foot:  Protective Sensation: 10 sites tested. 10 sites sensed.  Skin Integrity: Positive for dry skin. Negative for callus.  Lymphadenopathy:    She has no cervical adenopathy.  Neurological: She is alert and oriented to person, place, and time. She has normal strength. No cranial nerve deficit or sensory deficit. She displays a negative Romberg sign. Coordination and gait normal.  Skin: Skin is warm and dry. No rash noted. She is not diaphoretic. No erythema. No pallor.  Psychiatric: She has a normal mood and affect. Her behavior is normal. Judgment and thought content normal.      Patient has been counseled extensively about nutrition and exercise as well as the importance of adherence with medications and regular follow-up.  The patient was given clear instructions to go to ER or return to medical center if symptoms don't improve, worsen or new problems develop. The patient verbalized understanding.   Follow-up: Return in 7 weeks (on 12/22/2017) for DM/A1c.   Gildardo Pounds, FNP-BC [facility] and [place], [place]   11/05/2017, 12:45 PM

## 2017-11-06 ENCOUNTER — Telehealth: Payer: Self-pay

## 2017-11-06 NOTE — Telephone Encounter (Signed)
[employer] will need PCP to call [phone] for clinical review for the MRI Abdomen W W/O contrast.

## 2017-11-07 LAB — CYTOLOGY - PAP
Bacterial vaginitis: NEGATIVE
Candida vaginitis: POSITIVE — AB
Chlamydia: NEGATIVE
DIAGNOSIS: NEGATIVE
HPV (WINDOPATH): NOT DETECTED
Neisseria Gonorrhea: NEGATIVE
Trichomonas: NEGATIVE

## 2017-11-11 ENCOUNTER — Other Ambulatory Visit: Payer: Self-pay | Admitting: Nurse Practitioner

## 2017-11-11 MED ORDER — FLUCONAZOLE 150 MG PO TABS: 150.0000 mg | ORAL_TABLET | Freq: Once | ORAL | 1 refills | Status: AC

## 2017-11-13 ENCOUNTER — Telehealth: Payer: Self-pay

## 2017-11-13 NOTE — Telephone Encounter (Signed)
-----   Message from Claiborne Rigg, NP sent at 11/11/2017 10:05 PM EDT ----- PAP is negative for cervical cancer but does show yeast. Will send prescription to the pharmacy to treat your yeast infection.

## 2017-11-13 NOTE — Telephone Encounter (Signed)
CMA spoke to patient to inform on results and Rx.  Pt. Verified DOB. Pt. Understood.

## 2017-11-18 MED FILL — CONTOUR NEXT STRIPS: 25 days supply | Qty: 50 | Fill #2

## 2017-12-04 MED FILL — INVOKANA 100 MG TABLET: 100 | 30 days supply | Qty: 30 | Fill #2

## 2017-12-19 ENCOUNTER — Ambulatory Visit: Payer: BLUE CROSS/BLUE SHIELD | Admitting: Nurse Practitioner

## 2017-12-22 ENCOUNTER — Ambulatory Visit: Payer: BLUE CROSS/BLUE SHIELD | Attending: Nurse Practitioner | Admitting: Nurse Practitioner

## 2017-12-22 ENCOUNTER — Encounter: Payer: Self-pay | Admitting: Nurse Practitioner

## 2017-12-22 VITALS — BP 112/77 | HR 86 | Temp 98.6°F | Ht 61.0 in | Wt 138.2 lb

## 2017-12-22 DIAGNOSIS — E1165 Type 2 diabetes mellitus with hyperglycemia: Secondary | ICD-10-CM | POA: Diagnosis not present

## 2017-12-22 DIAGNOSIS — Z794 Long term (current) use of insulin: Secondary | ICD-10-CM | POA: Insufficient documentation

## 2017-12-22 DIAGNOSIS — E782 Mixed hyperlipidemia: Secondary | ICD-10-CM | POA: Insufficient documentation

## 2017-12-22 DIAGNOSIS — R7989 Other specified abnormal findings of blood chemistry: Secondary | ICD-10-CM | POA: Diagnosis not present

## 2017-12-22 DIAGNOSIS — Z833 Family history of diabetes mellitus: Secondary | ICD-10-CM | POA: Insufficient documentation

## 2017-12-22 DIAGNOSIS — IMO0002 Reserved for concepts with insufficient information to code with codable children: Secondary | ICD-10-CM

## 2017-12-22 DIAGNOSIS — Z79899 Other long term (current) drug therapy: Secondary | ICD-10-CM | POA: Insufficient documentation

## 2017-12-22 DIAGNOSIS — E118 Type 2 diabetes mellitus with unspecified complications: Secondary | ICD-10-CM

## 2017-12-22 LAB — GLUCOSE, POCT (MANUAL RESULT ENTRY): POC Glucose: 155 mg/dl — AB (ref 70–99)

## 2017-12-22 LAB — POCT GLYCOSYLATED HEMOGLOBIN (HGB A1C): Hemoglobin A1C: 8.5 % — AB (ref 4.0–5.6)

## 2017-12-22 MED ORDER — ATORVASTATIN CALCIUM 20 MG PO TABS: 20.0000 mg | ORAL_TABLET | Freq: Every day | ORAL | 3 refills | Status: DC

## 2017-12-22 MED ORDER — CANAGLIFLOZIN-METFORMIN HCL 50-1000 MG PO TABS
1.0000 | ORAL_TABLET | Freq: Two times a day (BID) | ORAL | 2 refills | Status: AC
Start: 2017-12-22 — End: 2018-01-21

## 2017-12-22 MED ORDER — LISINOPRIL 5 MG PO TABS: 5.0000 mg | ORAL_TABLET | Freq: Every day | ORAL | 3 refills | Status: DC

## 2017-12-22 MED FILL — ATORVASTATIN 20 MG TABLET: 20 | 30 days supply | Qty: 30 | Fill #0

## 2017-12-22 MED FILL — LISINOPRIL 5 MG TAB: 5 | 30 days supply | Qty: 30 | Fill #0

## 2017-12-22 MED FILL — CONTOUR NEXT STRIPS: 25 days supply | Qty: 50 | Fill #3

## 2017-12-22 MED FILL — INVOKAMET 50-1,000 MG TAB: 50-1000 | 30 days supply | Qty: 60 | Fill #0

## 2017-12-22 NOTE — Progress Notes (Signed)
Assessment & Plan:  Veronica Love was seen today for follow-up.  Diagnoses and all orders for this visit:  Diabetes mellitus type 2 with complications, uncontrolled (HCC) -     Glucose (CBG) -     HgB A1c -     lisinopril (PRINIVIL,ZESTRIL) 5 MG tablet; Take 1 tablet (5 mg total) by mouth daily. -     Canagliflozin-metFORMIN HCl 50-1000 MG TABS; Take 1 tablet by mouth 2 (two) times daily with a meal. Continue blood sugar control as discussed in office today, low carbohydrate diet, and regular physical exercise as tolerated, 150 minutes per week (30 min each day, 5 days per week, or 50 min 3 days per week). Keep blood sugar logs with fasting goal of 90-130 mg/dl, post prandial (after you eat) less than 180.  For Hypoglycemia: BS <60 and Hyperglycemia BS >400; contact the clinic ASAP. Annual eye exams and foot exams are recommended.   Mixed hyperlipidemia -     atorvastatin (LIPITOR) 20 MG tablet; Take 1 tablet (20 mg total) by mouth daily. INSTRUCTIONS: Work on a low fat, heart healthy diet and participate in regular aerobic exercise program by working out at least 150 minutes per week; 5 days a week-30 minutes per day. Avoid red meat, fried foods. junk foods, sodas, sugary drinks, unhealthy snacking, alcohol and smoking.  Drink at least 48oz of water per day and monitor your carbohydrate intake daily.    Low thyroid stimulating hormone (TSH) level -     Thyroid Panel With TSH She denies fatigue, weight changes, heat/cold intolerance, bowel/skin changes or CVS symptoms.  Lab Results  Component Value Date   TSH 0.232 (L) 09/17/2017    Patient has been counseled on age-appropriate routine health concerns for screening and prevention. These are reviewed and up-to-date. Referrals have been placed accordingly. Immunizations are up-to-date or declined.    Subjective:   Chief Complaint  Patient presents with  . Follow-up    Pt. is here to follow-up on diabetes.   HPI ANITRIA Love 32 y.o. female  presents to office today for follow up to DM Type 2. She has an onsite interpreter accompanying her today.    Diabetes Mellitus Type 2  Chronic and improved. Monitoring blood glucose levels BID. She has her diabetes log with her today. Average readings 120-140s. She denies any hypo or hyperglycemic symptoms. She is taking invokana 119m daily and metformin 1000 mg BID. Will combine and start on invokamet. She has not been taking lisinopril or her statin. I have discussed the ADA guidelines and recommendations for prevention of heart disease. She states she will try to take the lisinopril and atorvastatin daily. LDL not at goal.  Lab Results  Component Value Date   HGBA1C 8.5 (A) 12/22/2017   Lab Results  Component Value Date   LDLCALC 166 (H) 09/17/2017   Review of Systems  Constitutional: Negative for fever, malaise/fatigue and weight loss.  HENT: Negative.  Negative for nosebleeds.   Eyes: Negative.  Negative for blurred vision, double vision and photophobia.  Respiratory: Negative.  Negative for cough and shortness of breath.   Cardiovascular: Negative.  Negative for chest pain, palpitations and leg swelling.  Gastrointestinal: Negative.  Negative for heartburn, nausea and vomiting.  Musculoskeletal: Negative.  Negative for myalgias.  Neurological: Negative.  Negative for dizziness, focal weakness, seizures and headaches.  Psychiatric/Behavioral: Negative.  Negative for suicidal ideas.    Past Medical History:  Diagnosis Date  . Gestational diabetes 2013  . History  of positive PPD 2009   with negative chest x-ray  . No pertinent past medical history     Past Surgical History:  Procedure Laterality Date  . NO PAST SURGERIES      Family History  Problem Relation Age of Onset  . Diabetes Father     Social History Reviewed with no changes to be made today.   Outpatient Medications Prior to Visit  Medication Sig Dispense Refill  . cetirizine (ZYRTEC) 10 MG tablet Take 1  tablet (10 mg total) by mouth daily. 90 tablet 3  . glucose blood test strip Use as instructed. Check blood glucose by fingerstick two times per day 100 each 12  . Lancet Devices (MICROLET NEXT LANCING DEVICE) MISC 1 kit by Does not apply route 2 (two) times daily. 100 each 3  . MICROLET LANCETS MISC Use as instructed. Check blood glucose by fingerstick two times per day 100 each 3  . metFORMIN (GLUCOPHAGE) 1000 MG tablet Take 1 tablet (1,000 mg total) by mouth 2 (two) times daily with a meal. 180 tablet 4  . etonogestrel (NEXPLANON) 68 MG IMPL implant Inject 1 each (68 mg total) into the skin once. (Patient not taking: Reported on 07/06/2015) 1 each 0  . atorvastatin (LIPITOR) 20 MG tablet Take 1 tablet (20 mg total) by mouth daily. (Patient not taking: Reported on 12/22/2017) 90 tablet 3  . lisinopril (PRINIVIL,ZESTRIL) 5 MG tablet Take 1 tablet (5 mg total) by mouth daily. (Patient not taking: Reported on 12/22/2017) 90 tablet 3   Facility-Administered Medications Prior to Visit  Medication Dose Route Frequency Provider Last Rate Last Dose  . insulin aspart (novoLOG) injection 20 Units  20 Units Subcutaneous Once Chari Manning A, NP        No Known Allergies     Objective:    BP 112/77 (BP Location: Right Arm, Patient Position: Sitting, Cuff Size: Normal)   Pulse 86   Temp 98.6 F (37 C) (Oral)   Ht '5\' 1"'  (1.549 m)   Wt 138 lb 3.2 oz (62.7 kg)   LMP  (LMP Unknown)   SpO2 100%   BMI 26.11 kg/m  Wt Readings from Last 3 Encounters:  12/22/17 138 lb 3.2 oz (62.7 kg)  11/05/17 139 lb 6.4 oz (63.2 kg)  09/17/17 138 lb 3.2 oz (62.7 kg)    Physical Exam  Constitutional: She is oriented to person, place, and time. She appears well-developed and well-nourished. She is cooperative.  HENT:  Head: Normocephalic and atraumatic.  Eyes: EOM are normal.  Neck: Normal range of motion.  Cardiovascular: Normal rate, regular rhythm, normal heart sounds and intact distal pulses. Exam reveals no  gallop and no friction rub.  No murmur heard. Pulmonary/Chest: Effort normal and breath sounds normal. No tachypnea. No respiratory distress. She has no decreased breath sounds. She has no wheezes. She has no rhonchi. She has no rales. She exhibits no tenderness.  Abdominal: Soft. Bowel sounds are normal.  Musculoskeletal: Normal range of motion. She exhibits no edema.  Neurological: She is alert and oriented to person, place, and time. Coordination normal.  Skin: Skin is warm and dry.  Psychiatric: She has a normal mood and affect. Her behavior is normal. Judgment and thought content normal.  Nursing note and vitals reviewed.      Patient has been counseled extensively about nutrition and exercise as well as the importance of adherence with medications and regular follow-up. The patient was given clear instructions to go to ER or  return to medical center if symptoms don't improve, worsen or new problems develop. The patient verbalized understanding.   Follow-up: Return in about 3 months (around 03/24/2018) for HPL/DM.   Gildardo Pounds, FNP-BC [facility] and [facility], [place]   12/22/2017, 1:11 PM

## 2017-12-23 ENCOUNTER — Other Ambulatory Visit: Payer: Self-pay | Admitting: Nurse Practitioner

## 2017-12-23 DIAGNOSIS — E0590 Thyrotoxicosis, unspecified without thyrotoxic crisis or storm: Secondary | ICD-10-CM

## 2017-12-23 LAB — THYROID PANEL WITH TSH
Free Thyroxine Index: 2.5 (ref 1.2–4.9)
T3 UPTAKE RATIO: 26 % (ref 24–39)
T4, Total: 9.6 ug/dL (ref 4.5–12.0)
TSH: 0.233 u[IU]/mL — ABNORMAL LOW (ref 0.450–4.500)

## 2018-01-23 MED FILL — CONTOUR NEXT STRIPS: 25 days supply | Qty: 50 | Fill #4

## 2018-01-23 MED FILL — MICROLET LANCETS MISC: 50 days supply | Qty: 100 | Fill #1

## 2018-02-09 ENCOUNTER — Ambulatory Visit: Payer: BLUE CROSS/BLUE SHIELD | Admitting: Internal Medicine

## 2018-02-18 ENCOUNTER — Ambulatory Visit: Payer: BLUE CROSS/BLUE SHIELD | Admitting: Internal Medicine

## 2018-02-26 ENCOUNTER — Other Ambulatory Visit: Payer: Self-pay

## 2018-02-26 DIAGNOSIS — IMO0002 Reserved for concepts with insufficient information to code with codable children: Secondary | ICD-10-CM

## 2018-02-26 DIAGNOSIS — E1165 Type 2 diabetes mellitus with hyperglycemia: Secondary | ICD-10-CM

## 2018-02-26 DIAGNOSIS — E118 Type 2 diabetes mellitus with unspecified complications: Principal | ICD-10-CM

## 2018-02-26 MED ORDER — ACCU-CHEK GUIDE W/DEVICE KIT: 1.0000 | PACK | Freq: Every day | 0 refills | Status: DC

## 2018-02-26 MED ORDER — GLUCOSE BLOOD VI STRP: ORAL_STRIP | 12 refills | Status: DC

## 2018-02-26 MED ORDER — ACCU-CHEK FASTCLIX LANCETS MISC: 12 refills | Status: DC

## 2018-02-26 MED FILL — ACCU-CHEK GUIDE TEST STRIP: 30 days supply | Qty: 100 | Fill #0

## 2018-02-26 MED FILL — ACCU-CHEK GUIDE W/DEVICE KI: W/DEVICE | 30 days supply | Qty: 1 | Fill #0

## 2018-02-26 MED FILL — ACCU-CHEK FASTCLIX LANCETS: 30 days supply | Qty: 102 | Fill #0

## 2018-02-26 MED FILL — INVOKANA 100 MG TABLET: 100 | 30 days supply | Qty: 30 | Fill #3

## 2018-02-27 MED FILL — metFORMIN HCL 1000 MG TABS: 1000 | 30 days supply | Qty: 60 | Fill #2

## 2018-02-27 MED FILL — INVOKAMET 50-1,000 MG TAB: 50-1000 | 30 days supply | Qty: 60 | Fill #1

## 2018-03-03 ENCOUNTER — Ambulatory Visit: Payer: Medicaid Other | Admitting: Internal Medicine

## 2018-03-03 ENCOUNTER — Encounter: Payer: Self-pay | Admitting: Internal Medicine

## 2018-03-03 VITALS — BP 112/80 | HR 87 | Ht 61.0 in | Wt 138.2 lb

## 2018-03-03 DIAGNOSIS — E0590 Thyrotoxicosis, unspecified without thyrotoxic crisis or storm: Secondary | ICD-10-CM

## 2018-03-03 LAB — T4, FREE: Free T4: 0.91 ng/dL (ref 0.60–1.60)

## 2018-03-03 LAB — TSH: TSH: 0.18 u[IU]/mL — ABNORMAL LOW (ref 0.35–4.50)

## 2018-03-03 NOTE — Progress Notes (Signed)
Name: Veronica Love  MRN/ DOB: [id number], 04/03/1985    Age/ Sex: 33 y.o., female    PCP: Veronica Pounds, NP   Reason for Endocrinology Evaluation: Low TSH      Date of Initial Endocrinology Evaluation: 03/03/2018     HPI: [name] is a 33 y.o. female with a past medical history of Dyslipidemia and T2DM. The patient presented for initial endocrinology clinic visit on 03/03/2018 for consultative assistance with her low TSH .    Pt is here with interpreter  Pt was noted to have low TSH at 0.232 uIU/mL during routine lab workup in 08/2017. Repeat labs in 11/2017 confirmed similar results.   Pt denies local neck symptoms such as pain, swelling or hoarseness.   She denies weight changes, hair loss, diarrhea or heat intolerance. She has occasional palpitations and blurry vision.   No FH of thyroid disease  HISTORY:  Past Medical History:  Past Medical History:  Diagnosis Date  . Gestational diabetes 2013  . 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 No pertinent past medical history    Past Surgical History:  Past Surgical History:  Procedure Laterality Date  . NO PAST SURGERIES        Social History:  reports that she has never smoked. She has never used smokeless tobacco. She reports that she does not drink alcohol or use drugs.  Family History: family history includes Diabetes in her father.   HOME MEDICATIONS: Allergies as of 03/03/2018   No Known Allergies     Medication List       Accurate as of March 03, 2018  9:06 AM. Always use your most recent med list.        ACCU-CHEK FASTCLIX LANCETS Misc Use as directed once daily   ACCU-CHEK GUIDE w/Device Kit 1 each by Does not apply route daily.   atorvastatin 20 MG tablet Commonly known as:  LIPITOR Take 1 tablet (20 mg total) by mouth daily.   cetirizine 10 MG tablet Commonly known as:  ZYRTEC Take 1 tablet (10 mg total) by mouth daily.   etonogestrel 68 MG Impl implant Commonly  known as:  NEXPLANON Inject 1 each (68 mg total) into the skin once.   glucose blood test strip Commonly known as:  ACCU-CHEK GUIDE Use as directed once daily   lisinopril 5 MG tablet Commonly known as:  PRINIVIL,ZESTRIL Take 1 tablet (5 mg total) by mouth daily.   MICROLET NEXT LANCING DEVICE Misc 1 kit by Does not apply route 2 (two) times daily.         REVIEW OF SYSTEMS: A comprehensive ROS was conducted with the patient and is negative except as per HPI and below:  Review of Systems  Constitutional: Negative for malaise/fatigue and weight loss.  HENT: Negative for congestion and sore throat.   Eyes: Positive for blurred vision. Negative for pain.  Respiratory: Negative for cough and shortness of breath.   Cardiovascular: Positive for palpitations. Negative for chest pain.  Gastrointestinal: Negative for diarrhea and nausea.  Genitourinary: Negative for frequency.  Skin: Negative.   Neurological: Positive for tingling. Negative for tremors.  Endo/Heme/Allergies: Negative for polydipsia.  Psychiatric/Behavioral: Negative for depression. The patient is not nervous/anxious.        OBJECTIVE:  VS: BP 112/80 (BP Location: Left Arm, Patient Position: Sitting, Cuff Size: Normal)   Pulse 87   Ht '5\' 1"'  (1.549 m)   Wt 138  lb 3.2 oz (62.7 kg)   SpO2 98%   BMI 26.11 kg/m    Wt Readings from Last 3 Encounters:  03/03/18 138 lb 3.2 oz (62.7 kg)  12/22/17 138 lb 3.2 oz (62.7 kg)  11/05/17 139 lb 6.4 oz (63.2 kg)     EXAM: General: Pt appears well and is in NAD  Hydration: Well-hydrated with moist mucous membranes and good skin turgor  Eyes: External eye exam normal without stare, lid lag or exophthalmos.  EOM intact.  PERRL.  Ears, Nose, Throat: Hearing: Grossly intact bilaterally Dental: Good dentition  Throat: Clear without mass, erythema or exudate  Neck: General: Supple without adenopathy. Thyroid: Mild prominence of the right.   No goiter or nodules appreciated.  No thyroid bruit.  Lungs: Clear with good BS bilat with no rales, rhonchi, or wheezes  Heart: Auscultation: RRR.  Abdomen: Normoactive bowel sounds, soft, nontender, without masses or organomegaly palpable  Extremities:  BL LE: No pretibial edema normal ROM and strength.  Skin: Hair: Texture and amount normal with gender appropriate distribution Skin Inspection: No rashes, acanthosis nigricans/skin tags. No lipohypertrophy Skin Palpation: Skin temperature, texture, and thickness normal to palpation  Neuro: Cranial nerves: II - XII grossly intact  Motor: Normal strength throughout DTRs: 2+ and symmetric in UE without delay in relaxation phase  Mental Status: Judgment, insight: Intact Orientation: Oriented to time, place, and person Mood and affect: No depression, anxiety, or agitation     DATA REVIEWED: Results for [name], [name] (MRN [id number]) as of 03/03/2018 07:39  Ref. Range 09/17/2017 11:06 12/22/2017 10:49  TSH Latest Ref Range: 0.450 - 4.500 uIU/mL 0.232 (L) 0.233 (L)  Thyroxine (T4) Latest Ref Range: 4.5 - 12.0 ug/dL  9.6  Free Thyroxine Index Latest Ref Range: 1.2 - 4.9   2.5  T3 Uptake Ratio Latest Ref Range: 24 - 39 %  26     Results for [name], [name] (MRN [id number]) as of 03/03/2018 15:51  Ref. Range 03/03/2018 09:10  TSH Latest Ref Range: 0.35 - 4.50 uIU/mL 0.18 (L)  T4,Free(Direct) Latest Ref Range: 0.60 - 1.60 ng/dL 0.91   ASSESSMENT/PLAN/RECOMMENDATIONS:   1. Subclinical hyperthyroidism :  - The causes of subclinical hyperthyroidism are the same as the causes of overt hyperthyroidism, and like overt hyperthyroidism, subclinical hyperthyroidism can be persistent or transient. Common causes of subclinical hyperthyroidism include excessive thyroid hormone therapy (exogenous subclinical hyperthyroidism), autonomously functioning thyroid adenomas and multinodular goiters (endogenous subclinical hyperthyroidism), or Graves' disease (endogenous subclinical hyperthyroidism).   Most  patients with subclinical hyperthyroidism have no clinical manifestations of hyperthyroidism, and those symptoms that are present (eg, tachycardia, tremor, dyspnea on exertion, weight loss) are mild and nonspecific." However, subclinical hyperthyroidism is associated with an increased risk of atrial fibrillation and, primarily in postmenopausal women, a decrease in bone mineral density.  - No intervention needed at this time, will just monitor TFT's serially and intervene if she becomes symptomatic or her FT4 becomes elevated.  - Will obtain TRAb levels  2. Asymmetric Thyroid gland :   - Given prominent right lobe compared to left lobe, will order a thyroid ultrasound.  F/u in 3 months    Signed electronically by: Mack Guise, MD  [facility]  [facility] [address] [place], [place] [address] Phone: [phone] FAX: [phone]   CC: Veronica Pounds, NP [place] [address] Phone: [phone] Fax: [phone]   Return to Endocrinology clinic as below: Future Appointments  Date Time Provider [place]  03/24/2018  9:50 AM Veronica Pounds, NP [id number] None  06/02/2018  8:30 AM Ryle Buscemi, Melanie Crazier, MD [id number] None

## 2018-03-03 NOTE — Patient Instructions (Signed)
-   We will order a thyroid ultrasound to make sure you don't have any cysts.  - We will obtain more blood work to make sure there's no hereditary issues involved in your thyroid condition.

## 2018-03-04 LAB — TIQ- AMBIGUOUS ORDER

## 2018-03-07 LAB — TEST AUTHORIZATION

## 2018-03-07 LAB — TRAB (TSH RECEPTOR BINDING ANTIBODY): TRAB: 1.06 IU/L (ref <=2.00)

## 2018-03-07 LAB — T3: T3 TOTAL: 119 ng/dL (ref 76–181)

## 2018-03-16 ENCOUNTER — Other Ambulatory Visit: Payer: Self-pay | Admitting: Internal Medicine

## 2018-03-17 ENCOUNTER — Ambulatory Visit
Admission: RE | Admit: 2018-03-17 | Discharge: 2018-03-17 | Disposition: A | Discharge status: Home / Self Care | Payer: Medicaid Other | Source: Ambulatory Visit | Attending: Internal Medicine | Admitting: Internal Medicine

## 2018-03-17 DIAGNOSIS — E0590 Thyrotoxicosis, unspecified without thyrotoxic crisis or storm: Secondary | ICD-10-CM

## 2018-03-24 ENCOUNTER — Encounter: Payer: Self-pay | Admitting: Nurse Practitioner

## 2018-03-24 ENCOUNTER — Ambulatory Visit: Payer: Medicaid Other | Attending: Nurse Practitioner | Admitting: Nurse Practitioner

## 2018-03-24 VITALS — BP 117/80 | HR 85 | Temp 99.0°F | Ht 61.0 in | Wt 138.8 lb

## 2018-03-24 DIAGNOSIS — IMO0002 Reserved for concepts with insufficient information to code with codable children: Secondary | ICD-10-CM

## 2018-03-24 DIAGNOSIS — Z794 Long term (current) use of insulin: Secondary | ICD-10-CM | POA: Diagnosis not present

## 2018-03-24 DIAGNOSIS — E1165 Type 2 diabetes mellitus with hyperglycemia: Secondary | ICD-10-CM | POA: Diagnosis not present

## 2018-03-24 DIAGNOSIS — E118 Type 2 diabetes mellitus with unspecified complications: Secondary | ICD-10-CM

## 2018-03-24 DIAGNOSIS — R109 Unspecified abdominal pain: Secondary | ICD-10-CM | POA: Diagnosis not present

## 2018-03-24 DIAGNOSIS — G8929 Other chronic pain: Secondary | ICD-10-CM | POA: Diagnosis not present

## 2018-03-24 DIAGNOSIS — Z79899 Other long term (current) drug therapy: Secondary | ICD-10-CM | POA: Diagnosis not present

## 2018-03-24 LAB — GLUCOSE, POCT (MANUAL RESULT ENTRY): POC Glucose: 167 mg/dl — AB (ref 70–99)

## 2018-03-24 LAB — POCT GLYCOSYLATED HEMOGLOBIN (HGB A1C): Hemoglobin A1C: 8.5 % — AB (ref 4.0–5.6)

## 2018-03-24 MED ORDER — GLUCOSE BLOOD VI STRP: ORAL_STRIP | 12 refills | Status: DC

## 2018-03-24 MED ORDER — INSULIN PEN NEEDLE 31G X 5 MM MISC: 1 refills | Status: DC

## 2018-03-24 MED ORDER — LIRAGLUTIDE 18 MG/3ML ~~LOC~~ SOPN: PEN_INJECTOR | SUBCUTANEOUS | 6 refills | Status: DC

## 2018-03-24 MED ORDER — METFORMIN HCL 1000 MG PO TABS: 1000.0000 mg | ORAL_TABLET | Freq: Every day | ORAL | 2 refills | Status: DC

## 2018-03-24 MED ORDER — ACCU-CHEK GUIDE W/DEVICE KIT: 1.0000 | PACK | Freq: Every day | 0 refills | Status: DC

## 2018-03-24 MED ORDER — TRUEPLUS LANCETS 28G MISC: 3 refills | Status: DC

## 2018-03-24 MED FILL — VICTOZA 2-PAK 18 MG/3 ML PE: 18 | 34 days supply | Qty: 6 | Fill #0

## 2018-03-24 MED FILL — ACCU-CHEK GUIDE TEST STRIP: 30 days supply | Qty: 100 | Fill #1

## 2018-03-24 MED FILL — LISINOPRIL 5 MG TAB: 5 | 30 days supply | Qty: 30 | Fill #0

## 2018-03-24 MED FILL — ACCU-CHEK FASTCLIX LANCETS: 30 days supply | Qty: 102 | Fill #1

## 2018-03-24 MED FILL — ATORVASTATIN 20 MG TABLET: 20 | 30 days supply | Qty: 30 | Fill #2

## 2018-03-24 MED FILL — metFORMIN HCL 1000 MG TABS: 1000 | 30 days supply | Qty: 30 | Fill #0

## 2018-03-24 NOTE — Progress Notes (Signed)
Assessment & Plan:  Veronica Love was seen today for follow-up and abdominal pain.  Diagnoses and all orders for this visit:  Diabetes mellitus type 2 with complications, uncontrolled ([place]) -     Glucose (CBG) -     HgB A1c -     metFORMIN (GLUCOPHAGE) 1000 MG tablet; Take 1 tablet (1,000 mg total) by mouth daily with breakfast. -     liraglutide (VICTOZA) 18 MG/3ML SOPN; SubQ:  Inject 0.6 mg once daily into the skin. Week 2: increase to 1.2 mg once daily; week 3: increase to 1.8 mg once daily -     Insulin Pen Needle (B-D UF III MINI PEN NEEDLES) 31G X 5 MM MISC; Use as instructed. Inject into the skin once a day. -     Blood Glucose Monitoring Suppl (ACCU-CHEK GUIDE) w/Device KIT; 1 each by Does not apply route daily. -     glucose blood (ACCU-CHEK GUIDE) test strip; Use  2 times as directed once daily -     TRUEPLUS LANCETS 28G MISC; Use as instructed. Monitor blood glucose levels twice per day. Continue blood sugar control as discussed in office today, low carbohydrate diet, and regular physical exercise as tolerated, 150 minutes per week (30 min each day, 5 days per week, or 50 min 3 days per week). Keep blood sugar logs with fasting goal of 90-130 mg/dl, post prandial (after you eat) less than 180.  For Hypoglycemia: BS <60 and Hyperglycemia BS >400; contact the clinic ASAP. Annual eye exams and foot exams are recommended.   Chronic abdominal pain -     US Abdomen Complete; Future    Patient has been counseled on age-appropriate routine health concerns for screening and prevention. These are reviewed and up-to-date. Referrals have been placed accordingly. Immunizations are up-to-date or declined.    Subjective:   Chief Complaint  Patient presents with  . Follow-up  . Abdominal Pain    Pt. stated her stomach hurts.    HPI Veronica Love 33 y.o. female presents to office today for follow up to chronic abdominal pain and DM. VRI was used to communicate directly with patient for the entire  encounter including providing detailed patient instructions.    DM TYPE 2 Chronic and poorly controlled. Monitoring blood glucose levels twice per day: Postprandial 200s. Fasting 140s. She is still eating too many carbs several times per day including rice and noodles. We discussed dietary and exercise modifications today. She denies any hypo or hyperglycemic symptoms.  Current medications metformin 1000 mg BID. Will add victoza. She was able to speak with the pharmacist regarding administration of victoza today.  Lab Results  Component Value Date   HGBA1C 8.5 (A) 03/24/2018    Abdominal Pain Chronic. Generalized. She has a history of splenic cyst. Aggravating factors: None. Relieving factors: None. Pain goes away on its own. Can sometimes last for hours. Described as aching and sharp. Denies nausea, vomiting, hematuria or any GU symptoms.  LIMITED ABDOMINAL ULTRASOUND 07-06-2015 FINDINGS: Targeted sonographic images of the spleen using grayscale and color Doppler images provided. The spleen measures approximately 7 cm in length. There are 2 hypoechoic lesions within the spleen measuring up to 1.2 x 1.3 x 1.0 cm. Doppler images does not demonstrate flow within this lesions. These are nonspecific but may represent complex cysts or hemangiomas. Splenic abscess or less likely. IMPRESSION: Small hypoechoic splenic lesions, nonspecific, possibly hemangiomas, lymphangioma, or complex cysts. Other etiologies are not excluded. MRI is recommended on a non  emergent basis for further characterization.  Review of Systems  Constitutional: Negative for fever, malaise/fatigue and weight loss.  HENT: Negative.  Negative for nosebleeds.   Eyes: Negative.  Negative for blurred vision, double vision and photophobia.  Respiratory: Negative.  Negative for cough and shortness of breath.   Cardiovascular: Negative.  Negative for chest pain, palpitations and leg swelling.  Gastrointestinal: Positive for  abdominal pain. Negative for blood in stool, constipation, diarrhea, heartburn, melena, nausea and vomiting.  Musculoskeletal: Negative.  Negative for myalgias.  Neurological: Negative.  Negative for dizziness, focal weakness, seizures and headaches.  Psychiatric/Behavioral: Negative.  Negative for suicidal ideas.    Past Medical History:  Diagnosis Date  . Gestational diabetes 2013  . 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 No pertinent past medical history     Past Surgical History:  Procedure Laterality Date  . NO PAST SURGERIES      Family History  Problem Relation Age of Onset  . Diabetes Father     Social History Reviewed with no changes to be made today.   Outpatient Medications Prior to Visit  Medication Sig Dispense Refill  . ACCU-CHEK FASTCLIX LANCETS MISC Use as directed once daily 102 each 12  . atorvastatin (LIPITOR) 20 MG tablet Take 1 tablet (20 mg total) by mouth daily. 90 tablet 3  . cetirizine (ZYRTEC) 10 MG tablet Take 1 tablet (10 mg total) by mouth daily. 90 tablet 3  . etonogestrel (NEXPLANON) 68 MG IMPL implant Inject 1 each (68 mg total) into the skin once. 1 each 0  . lisinopril (PRINIVIL,ZESTRIL) 5 MG tablet Take 1 tablet (5 mg total) by mouth daily. 90 tablet 3  . Blood Glucose Monitoring Suppl (ACCU-CHEK GUIDE) w/Device KIT 1 each by Does not apply route daily. 1 kit 0  . glucose blood (ACCU-CHEK GUIDE) test strip Use as directed once daily (Patient taking differently: Use  2 times as directed once daily) 100 each 12  . Lancet Devices (MICROLET NEXT LANCING DEVICE) MISC 1 kit by Does not apply route 2 (two) times daily. 100 each 3   Facility-Administered Medications Prior to Visit  Medication Dose Route Frequency Provider Last Rate Last Dose  . insulin aspart (novoLOG) injection 20 Units  20 Units Subcutaneous Once Chari Manning A, NP        No Known Allergies     Objective:    BP 117/80 (BP Location: Right Arm, Patient  Position: Sitting, Cuff Size: Normal)   Pulse 85   Temp 99 F (37.2 C) (Oral)   Ht _0  (1.549 m)   Wt 138 lb 12.8 oz (63 kg)   SpO2 99%   BMI 26.23 kg/m  Wt Readings from Last 3 Encounters:  03/24/18 138 lb 12.8 oz (63 kg)  03/03/18 138 lb 3.2 oz (62.7 kg)  12/22/17 138 lb 3.2 oz (62.7 kg)    Physical Exam Vitals signs and nursing note reviewed.  Constitutional:      Appearance: She is well-developed.  HENT:     Head: Normocephalic and atraumatic.  Neck:     Musculoskeletal: Normal range of motion.  Cardiovascular:     Rate and Rhythm: Normal rate and regular rhythm.     Heart sounds: Normal heart sounds. No murmur. No friction rub. No gallop.   Pulmonary:     Effort: Pulmonary effort is normal. No tachypnea or respiratory distress.     Breath sounds: Normal breath sounds. No decreased breath sounds,  wheezing, rhonchi or rales.  Chest:     Chest wall: No tenderness.  Abdominal:     General: Bowel sounds are normal.     Palpations: Abdomen is soft.     Tenderness: There is generalized abdominal tenderness. There is no right CVA tenderness, left CVA tenderness or guarding. Negative signs include Murphy's sign, Rovsing's sign, McBurney's sign, psoas sign and obturator sign.  Musculoskeletal: Normal range of motion.  Skin:    General: Skin is warm and dry.  Neurological:     Mental Status: She is alert and oriented to person, place, and time.     Coordination: Coordination normal.  Psychiatric:        Behavior: Behavior normal. Behavior is cooperative.        Thought Content: Thought content normal.        Judgment: Judgment normal.         Patient has been counseled extensively about nutrition and exercise as well as the importance of adherence with medications and regular follow-up. The patient was given clear instructions to go to ER or return to medical center if symptoms don't improve, worsen or new problems develop. The patient verbalized understanding.    Follow-up: Return in about 3 months (around 06/22/2018) for DM.   Gildardo Pounds, FNP-BC [facility] and [place], [place]   03/25/2018, 10:37 PM

## 2018-03-25 ENCOUNTER — Encounter: Payer: Self-pay | Admitting: Nurse Practitioner

## 2018-03-25 ENCOUNTER — Telehealth: Payer: Self-pay | Admitting: Nurse Practitioner

## 2018-03-25 NOTE — Telephone Encounter (Signed)
Enrique Sack called in regards with a prior auth for the patient. Please follow up.  *did not want to provide a FU number stated she will follow up within epic

## 2018-03-25 NOTE — Telephone Encounter (Signed)
-----   Message from Orpah Clinton sent at 03/25/2018 11:05 AM EST ----- Regarding: Authorization Hello ,  Patient needs an authorization for US Abdomen Complete  We need this auth Today by 3pm. If anyway possible.  Thanks Lorenso Courier

## 2018-03-25 NOTE — Telephone Encounter (Signed)
Prior Berkley Harvey has been approved.  [id number] : Authorization number.

## 2018-03-26 ENCOUNTER — Ambulatory Visit (HOSPITAL_COMMUNITY)
Admission: RE | Admit: 2018-03-26 | Discharge: 2018-03-26 | Disposition: A | Discharge status: Home / Self Care | Payer: Medicaid Other | Source: Ambulatory Visit | Attending: Nurse Practitioner | Admitting: Nurse Practitioner

## 2018-03-26 DIAGNOSIS — G8929 Other chronic pain: Secondary | ICD-10-CM | POA: Diagnosis not present

## 2018-03-26 DIAGNOSIS — R109 Unspecified abdominal pain: Secondary | ICD-10-CM | POA: Insufficient documentation

## 2018-03-28 ENCOUNTER — Other Ambulatory Visit: Payer: Self-pay | Admitting: Nurse Practitioner

## 2018-03-28 DIAGNOSIS — G8929 Other chronic pain: Secondary | ICD-10-CM

## 2018-03-28 DIAGNOSIS — R109 Unspecified abdominal pain: Principal | ICD-10-CM

## 2018-04-23 ENCOUNTER — Ambulatory Visit: Payer: Medicaid Other | Admitting: Pharmacist

## 2018-04-24 MED FILL — metFORMIN HCL 1000 MG TABS: 1000 | 30 days supply | Qty: 30 | Fill #1

## 2018-04-24 MED FILL — VICTOZA 18 MG/3 ML INJECT P: 18 | 30 days supply | Qty: 9 | Fill #1

## 2018-05-07 ENCOUNTER — Other Ambulatory Visit: Payer: Self-pay | Admitting: Pharmacist

## 2018-05-07 MED ORDER — INSULIN PEN NEEDLE 32G X 4 MM MISC: 11 refills | Status: DC

## 2018-05-07 MED FILL — TRUEPLUS PEN NDL 32GX5/32": 32G X 4 MM | 90 days supply | Qty: 100 | Fill #0

## 2018-05-07 MED FILL — TRUEPLUS PEN NDL 32GX5/32: 32G X 4 MM | 90 days supply | Qty: 100 | Fill #0

## 2018-05-28 MED FILL — ATORVASTATIN 20 MG TABLET: 20 | 30 days supply | Qty: 30 | Fill #3

## 2018-05-28 MED FILL — VICTOZA 18 MG/3 ML INJECT P: 18 | 30 days supply | Qty: 9 | Fill #2

## 2018-05-28 MED FILL — metFORMIN HCL 1000 MG TABS: 1000 | 30 days supply | Qty: 30 | Fill #2

## 2018-05-28 MED FILL — LISINOPRIL 5 MG TAB: 5 | 30 days supply | Qty: 30 | Fill #1

## 2018-06-02 ENCOUNTER — Encounter: Payer: Self-pay | Admitting: Internal Medicine

## 2018-06-02 ENCOUNTER — Ambulatory Visit (INDEPENDENT_AMBULATORY_CARE_PROVIDER_SITE_OTHER): Payer: BLUE CROSS/BLUE SHIELD | Admitting: Internal Medicine

## 2018-06-02 ENCOUNTER — Other Ambulatory Visit: Payer: Self-pay

## 2018-06-02 VITALS — BP 104/78 | HR 94 | Temp 97.8°F | Ht 61.0 in | Wt 132.2 lb

## 2018-06-02 DIAGNOSIS — E0590 Thyrotoxicosis, unspecified without thyrotoxic crisis or storm: Secondary | ICD-10-CM

## 2018-06-02 LAB — T4, FREE: Free T4: 0.99 ng/dL (ref 0.60–1.60)

## 2018-06-02 LAB — TSH: TSH: 0.04 u[IU]/mL — ABNORMAL LOW (ref 0.35–4.50)

## 2018-06-02 NOTE — Progress Notes (Signed)
Name: Veronica Love  MRN/ DOB: [id number], 1985-10-02    Age/ Sex: 33 y.o., female     PCP: Gildardo Pounds, NP   Reason for Endocrinology Evaluation: Low TSH      Initial Endocrinology Clinic Visit: 03/03/2018    PATIENT IDENTIFIER: Veronica Love is a 33 y.o., female with a past medical history of  Dyslipidemia and T2DM.Marland Kitchen She has followed with [facility] since 03/03/2018 for consultative assistance with management of her low TSH    HISTORICAL SUMMARY:  Pt was noted to have low TSH at 0.232 uIU/mL during routine lab workup in 08/2017. Repeat labs in 11/2017 confirmed similar results.    SUBJECTIVE:   During last visit (03/03/2018): TSH continued to be low with normal FT4 and no symptoms.   Today (06/02/2018):  Veronica Love is here for 3 month follow up on subclinical hyperthyroidism.  She has lost weight since her last visit here, she attributes this to [place].  Otherwise she denies any palpitations, diarrhea or anxiety.   She does endorse painful lymphadenopathy at time but no anterior neck enlargement.   She is not on biotin  She is on Nexplanon   ROS:  As per HPI.   HISTORY:  Past Medical History:  Past Medical History:  Diagnosis Date  . Gestational diabetes 2013  . 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 No pertinent past medical history    Past Surgical History:  Past Surgical History:  Procedure Laterality Date  . NO PAST SURGERIES      Social History:  reports that she has never smoked. She has never used smokeless tobacco. She reports that she does not drink alcohol or use drugs. Family History:  Family History  Problem Relation Age of Onset  . Diabetes Father      HOME MEDICATIONS: Allergies as of 06/02/2018   No Known Allergies     Medication List       Accurate as of Jun 02, 2018 10:25 AM. Always use your most recent med list.        Accu-Chek FastClix Lancets Misc Use as directed once daily   TRUEplus Lancets  28G Misc Use as instructed. Monitor blood glucose levels twice per day.   Accu-Chek Guide w/Device Kit 1 each by Does not apply route daily.   atorvastatin 20 MG tablet Commonly known as:  LIPITOR Take 1 tablet (20 mg total) by mouth daily.   cetirizine 10 MG tablet Commonly known as:  ZYRTEC Take 1 tablet (10 mg total) by mouth daily.   etonogestrel 68 MG Impl implant Commonly known as:  Nexplanon Inject 1 each (68 mg total) into the skin once.   glucose blood test strip Commonly known as:  Accu-Chek Guide Use  2 times as directed once daily   Insulin Pen Needle 32G X 4 MM Misc Commonly known as:  TRUEplus Pen Needles Use to inject Victoza daily.   liraglutide 18 MG/3ML Sopn Commonly known as:  VICTOZA SubQ:  Inject 0.6 mg once daily into the skin. Week 2: increase to 1.2 mg once daily; week 3: increase to 1.8 mg once daily   lisinopril 5 MG tablet Commonly known as:  ZESTRIL Take 1 tablet (5 mg total) by mouth daily.   metFORMIN 1000 MG tablet Commonly known as:  GLUCOPHAGE Take 1 tablet (1,000 mg total) by mouth daily with breakfast.         OBJECTIVE:   PHYSICAL  EXAM: VS: BP 104/78 (BP Location: Right Arm, Patient Position: Sitting, Cuff Size: Normal)   Pulse 94   Temp 97.8 F (36.6 C)   Ht _0  (1.549 m)   Wt 132 lb 3.2 oz (60 kg)   SpO2 99%   BMI 24.98 kg/m    EXAM: General: Pt appears well and is in NAD  Ears, Nose, Throat: Hearing: Grossly intact bilaterally Dental: Good dentition  Throat: Clear without mass, erythema or exudate  Neck: General: Supple without adenopathy. Thyroid: Thyoid asymmetry noted R> L lobe.  No goiter or nodules appreciated. No thyroid bruit.  Lungs: Clear with good BS bilat with no rales, rhonchi, or wheezes  Heart: Auscultation: RRR.  Abdomen: Normoactive bowel sounds, soft, nontender, without masses or organomegaly palpable  Extremities:  BL LE: No pretibial edema normal ROM and strength.  Skin: Hair: Texture and  amount normal with gender appropriate distribution Skin Inspection: No rashes Skin Palpation: Skin temperature, texture, and thickness normal to palpation  Neuro: Cranial nerves: II - XII grossly intact  Motor: Normal strength throughout DTRs: 2+ and symmetric in UE without delay in relaxation phase  Mental Status: Judgment, insight: Intact Orientation: Oriented to time, place, and person Mood and affect: No depression, anxiety, or agitation     DATA REVIEWED: Results for MICCA, MATURA (MRN [id number]) as of 06/02/2018 13:11  Ref. Range 12/22/2017 10:49 03/03/2018 09:10 06/02/2018 09:08  TSH Latest Ref Range: 0.35 - 4.50 uIU/mL 0.233 (L) 0.18 (L) 0.04 (L)  Triiodothyronine (T3) Latest Ref Range: 76 - 181 ng/dL  119   T4,Free(Direct) Latest Ref Range: 0.60 - 1.60 ng/dL  0.91 0.99   Results for DEMONICA, FARREY (MRN [id number]) as of 06/02/2018 07:55  Ref. Range 03/03/2018 09:10  TRAB Latest Ref Range: <=2.00 IU/L 1.06     Thyroid Ultrasound 03/17/2018 There are no discrete nodules which meet criteria for biopsy nor follow-up. The right lobe is larger than the left.    ASSESSMENT / PLAN / RECOMMENDATIONS:   1. 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  - Repeat TFT's today show low TSH with normal T4. We will continue to monitor at this time with no intervention unless she develops overt hyperthyroid symptoms or her FT4 increased.    F/u in 6 months.    Signed electronically by: Mack Guise, MD  [facility]  [facility] [address] [place], [place] [address] Phone: [phone] FAX: [phone]      CC: Gildardo Pounds, NP [place] [address] Phone: [phone]  Fax: [phone]   Return to Endocrinology clinic as  below: Future Appointments  Date Time Provider [place]  06/23/2018  9:50 AM Gildardo Pounds, NP [id number] None  12/01/2018  9:30 AM Natesha Hassey, Melanie Crazier, MD [id number] None

## 2018-06-03 LAB — T3: T3, Total: 126 ng/dL (ref 76–181)

## 2018-06-23 ENCOUNTER — Ambulatory Visit: Payer: Medicaid Other | Admitting: Nurse Practitioner

## 2018-06-30 MED FILL — VICTOZA 18 MG/3 ML INJECT P: 18 | 30 days supply | Qty: 9 | Fill #3

## 2018-06-30 MED FILL — metFORMIN HCL 1000 MG TABS: 1000 | 30 days supply | Qty: 30 | Fill #3

## 2018-06-30 MED FILL — ACCU-CHEK GUIDE TEST STRIP: 30 days supply | Qty: 100 | Fill #2

## 2018-07-08 ENCOUNTER — Encounter: Payer: Self-pay | Admitting: Nurse Practitioner

## 2018-07-08 ENCOUNTER — Other Ambulatory Visit: Payer: Self-pay

## 2018-07-08 ENCOUNTER — Ambulatory Visit: Payer: BLUE CROSS/BLUE SHIELD | Attending: Nurse Practitioner | Admitting: Nurse Practitioner

## 2018-07-08 VITALS — BP 118/74 | HR 87 | Temp 98.8°F | Wt 134.0 lb

## 2018-07-08 DIAGNOSIS — E1165 Type 2 diabetes mellitus with hyperglycemia: Secondary | ICD-10-CM | POA: Diagnosis not present

## 2018-07-08 DIAGNOSIS — K8020 Calculus of gallbladder without cholecystitis without obstruction: Secondary | ICD-10-CM | POA: Diagnosis not present

## 2018-07-08 DIAGNOSIS — Z23 Encounter for immunization: Secondary | ICD-10-CM

## 2018-07-08 DIAGNOSIS — E782 Mixed hyperlipidemia: Secondary | ICD-10-CM

## 2018-07-08 LAB — POCT GLYCOSYLATED HEMOGLOBIN (HGB A1C): Hemoglobin A1C: 6.6 % — AB (ref 4.0–5.6)

## 2018-07-08 LAB — GLUCOSE, POCT (MANUAL RESULT ENTRY): POC Glucose: 158 mg/dl — AB (ref 70–99)

## 2018-07-08 MED ORDER — PNEUMOCOCCAL VAC POLYVALENT 25 MCG/0.5ML IJ INJ: 0.5000 mL | INJECTION | Freq: Once | INTRAMUSCULAR | 0 refills | Status: DC

## 2018-07-08 NOTE — Progress Notes (Signed)
Assessment & Plan:  Crosby was seen today for follow-up.  Diagnoses and all orders for this visit:  Uncontrolled type 2 diabetes mellitus with hyperglycemia (HCC) -     Glucose (CBG) -     HgB A1c -     CBC -     CMP14+EGFR Denies chest pain, shortness of breath, palpitations, lightheadedness, dizziness, headaches or BLE edema.   Gallstones  -     Ambulatory referral to Gastroenterology  Mixed hyperlipidemia -     Lipid panel INSTRUCTIONS: Work on a low fat, heart healthy diet and participate in regular aerobic exercise program by working out at least 150 minutes per week; 5 days a week-30 minutes per day. Avoid red meat, fried foods. junk foods, sodas, sugary drinks, unhealthy snacking, alcohol and smoking.  Drink at least 48oz of water per day and monitor your carbohydrate intake daily.      Patient has been counseled on age-appropriate routine health concerns for screening and prevention. These are reviewed and up-to-date. Referrals have been placed accordingly. Immunizations are up-to-date or declined.    Subjective:   Chief Complaint  Patient presents with  . Follow-up    Pt. is here to follow up on diabetes.    HPI Veronica Love 33 y.o. female presents to office today for follow up to DM.  She has a history of chronic abdominal pain and ultrasound showed gallstones in February.  I referred her to GI however unfortunately she did not bring her MyChart message and was of her gallstones was calling to schedule.  We will need to refer back to GI as she continues with abdominal pain.   DM TYPE 2 Current medications: metformin 1000 mg daily, victoza 1.32m daily. Taking statin (Lipitor 20 mg) and ACE inhibitor Lisinopril 5 mg).  A1c down from 8.5.  She is doing well today we will not make any changes with her current medications. She denies any hypo-or hyperglycemic symptoms.  Overdue for eye exam. Denies chest pain, shortness of breath, palpitations, lightheadedness, dizziness,  headaches or BLE edema.  Lab Results  Component Value Date   HGBA1C 6.6 (A) 07/08/2018   Lab Results  Component Value Date   HGBA1C 8.5 (A) 03/24/2018    Hyperlipidemia Patient presents for follow up to hyperlipidemia.  She is medication compliant taking atorvastatin 20 mg daily. She is diet compliant and denies exertional chest pressure/discomfort, poor exercise tolerance and skin xanthelasma or statin intolerance including myalgias.  LDL not at goal of less than 70. Lab Results  Component Value Date   CHOL 247 (H) 09/17/2017   Lab Results  Component Value Date   HDL 42 09/17/2017   Lab Results  Component Value Date   LDLCALC 166 (H) 09/17/2017   Lab Results  Component Value Date   TRIG 194 (H) 09/17/2017   Lab Results  Component Value Date   CHOLHDL 5.9 (H) 09/17/2017           .    Review of Systems  Constitutional: Negative for fever, malaise/fatigue and weight loss.       Decreased appetite likely related to Victoza  HENT: Negative.  Negative for nosebleeds.   Eyes: Negative.  Negative for blurred vision, double vision and photophobia.  Respiratory: Negative.  Negative for cough and shortness of breath.   Cardiovascular: Negative.  Negative for chest pain, palpitations and leg swelling.  Gastrointestinal: Positive for abdominal pain. Negative for heartburn, nausea and vomiting.  Musculoskeletal: Negative.  Negative for myalgias.  Neurological: Negative.  Negative for dizziness, focal weakness, seizures and headaches.  Psychiatric/Behavioral: Negative.  Negative for suicidal ideas.    Past Medical History:  Diagnosis Date  . Diabetes mellitus type 2, uncontrolled, with complications ([place])   . Gestational diabetes 2013  . 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 No pertinent past medical history     Past Surgical History:  Procedure Laterality Date  . NO PAST SURGERIES      Family History  Problem Relation Age of Onset  .  Diabetes Father     Social History Reviewed with no changes to be made today.   Outpatient Medications Prior to Visit  Medication Sig Dispense Refill  . ACCU-CHEK FASTCLIX LANCETS MISC Use as directed once daily 102 each 12  . atorvastatin (LIPITOR) 20 MG tablet Take 1 tablet (20 mg total) by mouth daily. 90 tablet 3  . Blood Glucose Monitoring Suppl (ACCU-CHEK GUIDE) w/Device KIT 1 each by Does not apply route daily. 1 kit 0  . etonogestrel (NEXPLANON) 68 MG IMPL implant Inject 1 each (68 mg total) into the skin once. 1 each 0  . glucose blood (ACCU-CHEK GUIDE) test strip Use  2 times as directed once daily 100 each 12  . Insulin Pen Needle (TRUEPLUS PEN NEEDLES) 32G X 4 MM MISC Use to inject Victoza daily. 100 each 11  . liraglutide (VICTOZA) 18 MG/3ML SOPN SubQ:  Inject 0.6 mg once daily into the skin. Week 2: increase to 1.2 mg once daily; week 3: increase to 1.8 mg once daily 15 mL 6  . lisinopril (PRINIVIL,ZESTRIL) 5 MG tablet Take 1 tablet (5 mg total) by mouth daily. 90 tablet 3  . TRUEPLUS LANCETS 28G MISC Use as instructed. Monitor blood glucose levels twice per day. 100 each 3  . cetirizine (ZYRTEC) 10 MG tablet Take 1 tablet (10 mg total) by mouth daily. (Patient not taking: Reported on 06/02/2018) 90 tablet 3  . metFORMIN (GLUCOPHAGE) 1000 MG tablet Take 1 tablet (1,000 mg total) by mouth daily with breakfast. 90 tablet 2   Facility-Administered Medications Prior to Visit  Medication Dose Route Frequency Provider Last Rate Last Dose  . insulin aspart (novoLOG) injection 20 Units  20 Units Subcutaneous Once Chari Manning A, NP        No Known Allergies     Objective:    BP 118/74 (BP Location: Right Arm, Patient Position: Sitting, Cuff Size: Normal)   Pulse 87   Temp 98.8 F (37.1 C) (Oral)   Wt 134 lb (60.8 kg)   SpO2 100%   BMI 25.32 kg/m  Wt Readings from Last 3 Encounters:  07/08/18 134 lb (60.8 kg)  06/02/18 132 lb 3.2 oz (60 kg)  03/24/18 138 lb 12.8 oz (63 kg)     Physical Exam Vitals signs and nursing note reviewed.  Constitutional:      Appearance: She is well-developed.  HENT:     Head: Normocephalic and atraumatic.  Neck:     Musculoskeletal: Normal range of motion.  Cardiovascular:     Rate and Rhythm: Normal rate and regular rhythm.     Heart sounds: Normal heart sounds. No murmur. No friction rub. No gallop.   Pulmonary:     Effort: Pulmonary effort is normal. No tachypnea or respiratory distress.     Breath sounds: Normal breath sounds. No decreased breath sounds, wheezing, rhonchi or rales.  Chest:     Chest wall: No tenderness.  Abdominal:  General: Bowel sounds are normal.     Palpations: Abdomen is soft.  Musculoskeletal: Normal range of motion.  Skin:    General: Skin is warm and dry.  Neurological:     Mental Status: She is alert and oriented to person, place, and time.     Coordination: Coordination normal.  Psychiatric:        Behavior: Behavior normal. Behavior is cooperative.        Thought Content: Thought content normal.        Judgment: Judgment normal.          Patient has been counseled extensively about nutrition and exercise as well as the importance of adherence with medications and regular follow-up. The patient was given clear instructions to go to ER or return to medical center if symptoms don't improve, worsen or new problems develop. The patient verbalized understanding.   Follow-up: Return in about 3 months (around 10/08/2018) for DM/lipids.   Gildardo Pounds, FNP-BC [facility] [place] [place], [place]   07/08/2018, 3:01 PM

## 2018-07-09 LAB — CBC
Hematocrit: 38 % (ref 34.0–46.6)
Hemoglobin: 12.6 g/dL (ref 11.1–15.9)
MCH: 24.9 pg — ABNORMAL LOW (ref 26.6–33.0)
MCHC: 33.2 g/dL (ref 31.5–35.7)
MCV: 75 fL — ABNORMAL LOW (ref 79–97)
Platelets: 262 10*3/uL (ref 150–450)
RBC: 5.06 x10E6/uL (ref 3.77–5.28)
RDW: 15.4 % (ref 11.7–15.4)
WBC: 7.3 10*3/uL (ref 3.4–10.8)

## 2018-07-09 LAB — CMP14+EGFR
ALT: 14 IU/L (ref 0–32)
AST: 15 IU/L (ref 0–40)
Albumin/Globulin Ratio: 1.5 (ref 1.2–2.2)
Albumin: 4.9 g/dL — ABNORMAL HIGH (ref 3.8–4.8)
Alkaline Phosphatase: 47 IU/L (ref 39–117)
BUN/Creatinine Ratio: 13 (ref 9–23)
BUN: 7 mg/dL (ref 6–20)
Bilirubin Total: 0.3 mg/dL (ref 0.0–1.2)
CO2: 23 mmol/L (ref 20–29)
Calcium: 9.2 mg/dL (ref 8.7–10.2)
Chloride: 103 mmol/L (ref 96–106)
Creatinine, Ser: 0.53 mg/dL — ABNORMAL LOW (ref 0.57–1.00)
GFR calc Af Amer: 143 mL/min/{1.73_m2} (ref >59)
GFR calc non Af Amer: 124 mL/min/{1.73_m2} (ref >59)
Globulin, Total: 3.3 g/dL (ref 1.5–4.5)
Glucose: 98 mg/dL (ref 65–99)
Potassium: 3.6 mmol/L (ref 3.5–5.2)
Sodium: 141 mmol/L (ref 134–144)
Total Protein: 8.2 g/dL (ref 6.0–8.5)

## 2018-07-09 LAB — LIPID PANEL
Chol/HDL Ratio: 5.3 ratio — ABNORMAL HIGH (ref 0.0–4.4)
Cholesterol, Total: 195 mg/dL (ref 100–199)
HDL: 37 mg/dL — ABNORMAL LOW (ref >39)
LDL Calculated: 108 mg/dL — ABNORMAL HIGH (ref 0–99)
Triglycerides: 249 mg/dL — ABNORMAL HIGH (ref 0–149)
VLDL Cholesterol Cal: 50 mg/dL — ABNORMAL HIGH (ref 5–40)

## 2018-07-14 ENCOUNTER — Encounter: Payer: Self-pay | Admitting: Gastroenterology

## 2018-07-14 ENCOUNTER — Other Ambulatory Visit: Payer: Self-pay

## 2018-07-14 ENCOUNTER — Ambulatory Visit (INDEPENDENT_AMBULATORY_CARE_PROVIDER_SITE_OTHER): Payer: BLUE CROSS/BLUE SHIELD | Admitting: Gastroenterology

## 2018-07-14 DIAGNOSIS — K8020 Calculus of gallbladder without cholecystitis without obstruction: Secondary | ICD-10-CM

## 2018-07-14 NOTE — Progress Notes (Signed)
TELEHEALTH VISIT  Referring Provider: Gildardo Pounds, NP Primary Care Physician:  Gildardo Pounds, NP   Tele-visit due to COVID-19 pandemic Patient requested visit virtually, consented to the virtual encounter via video enabled telemedicine application (converted to audio after failed attempt at patient joining Zoom despite assistance of the interpreter) Contact made at: 11:30 07/14/18 Patient verified by name and date of birth Location of patient: Home Location provider: [facility] Names of persons participating: Me, patient, Tinnie Gens CMA Time spent on telehealth visit: 24 minutes I discussed the limitations of evaluation and management by telemedicine. The patient expressed understanding and agreed to proceed.  Reason for Consultation: Gallstones   IMPRESSION:  Asymptomatic cholelithiasis on ultrasound 03/26/2018 Prior encounters for abdominal pain - although the patient denies any abdominal pain today  The majority of patients with asymptomatic (incidental) gallstones do not require treatment. Management is expectant with referral for cholecystectomy if symptoms develop. Patients with asymptomatic gallstones appear to have a lower risk of complications than those with symptomatic gallstones. We discussed the potential complications of cholecystitis, choledocholithiasis with or without acute cholangitis, and gallstone pancreatitis.  Rare complications can include gallstone ileus and Mirizzi syndrome.    There are no medical therapy is approved by the FDA for treatment of asymptomatic gallstones.  I do not recommend extracorporal shockwave lithotripsy for asymptomatic stones given the associated high rates of biliary colic and gallstone recurrence. A trial of ursodiol could be considered however this is usually reserved for symptomatic cholelithiasis.  There would be no endpoint to treatment.  I have encouraged the patient to explore the www.up-to-date.com website  where there is a good information on gallstones as well as a webpage titled beyond the basics.  PLAN: Reassurance provided. Reviewed symptoms that would warrant additional investigation and/or referral to surgery Return to this clinic PRN  All questions were answered to the patient's satisfaction.    HPI: Veronica Love is a 33 y.o. female referred by NP Raul Del for further evaluation of gallstones.  The history is obtained through the patient and review of her electronic health record. [facility] interpreter assisted.  She is a history of type 2 diabetes and hyperlipidemia.  The patient denies any abdominal pain. She is unsure about why an appointment was scheduled.  No history of discrete episodes of biliary colic.  She denies a history of belching, fullness after meals, early satiety satiety, regurgitation, abdominal distention or bloating, epigastric or retrosternal burning, nausea or vomiting, chest pain, nonspecific abdominal pain.   She has a history of chronic abdominal pain documented in prior office notes in EPIC.  An abdominal ultrasound 03/26/2018 to evaluate chronic abdominal pain since 2017 showed gallstones and a stable splenic cyst but was otherwise normal.  Labs 07/08/2018 showed a normal comprehensive metabolic panel except for an albumin of 4.9 and a creatinine of 0.53.  Liver enzymes were normal.  Total protein 8.2.  White count 7.3, hemoglobin 12.6, MCV 75, RDW 15.4, platelets 262.  Hemoglobin A1c 6.6.  No known family history of colon cancer or polyps. No family history of uterine/endometrial cancer, pancreatic cancer or gastric/stomach cancer.  Past Medical History:  Diagnosis Date  . Diabetes mellitus type 2, uncontrolled, with complications ([place])   . Gestational diabetes 2013  . 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 No pertinent past medical history     Past Surgical History:  Procedure Laterality Date  . NO PAST SURGERIES  Current Outpatient Medications  Medication Sig Dispense Refill  . ACCU-CHEK FASTCLIX LANCETS MISC Use as directed once daily 102 each 12  . atorvastatin (LIPITOR) 20 MG tablet Take 1 tablet (20 mg total) by mouth daily. 90 tablet 3  . Blood Glucose Monitoring Suppl (ACCU-CHEK GUIDE) w/Device KIT 1 each by Does not apply route daily. 1 kit 0  . cetirizine (ZYRTEC) 10 MG tablet Take 1 tablet (10 mg total) by mouth daily. (Patient not taking: Reported on 06/02/2018) 90 tablet 3  . etonogestrel (NEXPLANON) 68 MG IMPL implant Inject 1 each (68 mg total) into the skin once. 1 each 0  . glucose blood (ACCU-CHEK GUIDE) test strip Use  2 times as directed once daily 100 each 12  . Insulin Pen Needle (TRUEPLUS PEN NEEDLES) 32G X 4 MM MISC Use to inject Victoza daily. 100 each 11  . liraglutide (VICTOZA) 18 MG/3ML SOPN SubQ:  Inject 0.6 mg once daily into the skin. Week 2: increase to 1.2 mg once daily; week 3: increase to 1.8 mg once daily 15 mL 6  . lisinopril (PRINIVIL,ZESTRIL) 5 MG tablet Take 1 tablet (5 mg total) by mouth daily. 90 tablet 3  . metFORMIN (GLUCOPHAGE) 1000 MG tablet Take 1 tablet (1,000 mg total) by mouth daily with breakfast. 90 tablet 2  . TRUEPLUS LANCETS 28G MISC Use as instructed. Monitor blood glucose levels twice per day. 100 each 3   Current Facility-Administered Medications  Medication Dose Route Frequency Provider Last Rate Last Dose  . insulin aspart (novoLOG) injection 20 Units  20 Units Subcutaneous Once Chari Manning A, NP        Allergies as of 07/14/2018  . (No Known Allergies)    Family History  Problem Relation Age of Onset  . Diabetes Father     Social History   Socioeconomic History  . Marital status: Married    Spouse name: Not on file  . Number of children: Not on file  . Years of education: Not on file  . Highest education level: Not on file  Occupational History  . Not on file  Social Needs  . Financial resource strain: Not on file  . Food  insecurity    Worry: Not on file    Inability: Not on file  . Transportation needs    Medical: Not on file    Non-medical: Not on file  Tobacco Use  . Smoking status: Never Smoker  . Smokeless tobacco: Never Used  Substance and Sexual Activity  . Alcohol use: No  . Drug use: No  . Sexual activity: Yes    Birth control/protection: Implant  Lifestyle  . Physical activity    Days per week: Not on file    Minutes per session: Not on file  . Stress: Not on file  Relationships  . Social Herbalist on phone: Not on file    Gets together: Not on file    Attends religious service: Not on file    Active member of club or organization: Not on file    Attends meetings of clubs or organizations: Not on file    Relationship status: Not on file  . Intimate partner violence    Fear of current or ex partner: Not on file    Emotionally abused: Not on file    Physically abused: Not on file    Forced sexual activity: Not on file  Other Topics Concern  . Not on file  Social History Narrative  .  Not on file    Review of Systems: ALL ROS discussed and all others negative except listed in HPI.  Physical Exam: Complete physical exam not performed due to the limits inherent in a telehealth encounter.  General: Awake, alert, and oriented, and well communicative. In no acute distress.  HEENT: EOMI, non-icteric sclera, NCAT, MMM  Neck: Normal movement of head and neck  Pulm: No labored breathing, speaking in full sentences without conversational dyspnea  Derm: No apparent lesions or bruising in visible field  MS: Moves all visible extremities without noticeable abnormality  Psych: Pleasant, cooperative, normal speech, normal affect and normal insight Neuro: Alert and appropriate   Pristine Gladhill L. Tarri Glenn, MD, MPH [facility] 07/14/2018, 10:23 AM

## 2018-07-22 NOTE — Progress Notes (Signed)
CMA spoke to patient to inform on results and PCP advising.  Pt. Verified DOB. Pt. Understood.  

## 2018-08-07 MED FILL — LISINOPRIL 5 MG TAB: 5 | 90 days supply | Qty: 90 | Fill #2

## 2018-08-07 MED FILL — metFORMIN HCL 1000 MG TABS: 1000 | 90 days supply | Qty: 90 | Fill #4

## 2018-08-07 MED FILL — ATORVASTATIN 20 MG TABLET: 20 | 90 days supply | Qty: 90 | Fill #4

## 2018-08-07 MED FILL — VICTOZA 18 MG/3 ML INJECT P: 18 | 30 days supply | Qty: 9 | Fill #4

## 2018-10-12 ENCOUNTER — Ambulatory Visit: Payer: BLUE CROSS/BLUE SHIELD | Attending: Nurse Practitioner | Admitting: Nurse Practitioner

## 2018-10-12 ENCOUNTER — Other Ambulatory Visit: Payer: Self-pay

## 2018-10-12 ENCOUNTER — Encounter: Payer: Self-pay | Admitting: Nurse Practitioner

## 2018-10-12 VITALS — BP 107/67 | HR 89 | Temp 98.7°F | Ht 61.0 in | Wt 136.0 lb

## 2018-10-12 DIAGNOSIS — IMO0002 Reserved for concepts with insufficient information to code with codable children: Secondary | ICD-10-CM

## 2018-10-12 DIAGNOSIS — E1165 Type 2 diabetes mellitus with hyperglycemia: Secondary | ICD-10-CM | POA: Diagnosis not present

## 2018-10-12 DIAGNOSIS — E782 Mixed hyperlipidemia: Secondary | ICD-10-CM

## 2018-10-12 LAB — POCT GLYCOSYLATED HEMOGLOBIN (HGB A1C): Hemoglobin A1C: 8.2 % — AB (ref 4.0–5.6)

## 2018-10-12 LAB — GLUCOSE, POCT (MANUAL RESULT ENTRY): POC Glucose: 170 mg/dl — AB (ref 70–99)

## 2018-10-12 MED ORDER — GLIMEPIRIDE 4 MG PO TABS: 4.0000 mg | ORAL_TABLET | Freq: Every day | ORAL | 3 refills | Status: DC

## 2018-10-12 MED ORDER — ACCU-CHEK GUIDE VI STRP: ORAL_STRIP | 12 refills | Status: DC

## 2018-10-12 MED ORDER — METFORMIN HCL 1000 MG PO TABS: 1000.0000 mg | ORAL_TABLET | Freq: Every day | ORAL | 2 refills | Status: DC

## 2018-10-12 MED ORDER — JARDIANCE 10 MG PO TABS: 10.0000 mg | ORAL_TABLET | Freq: Every day | ORAL | 1 refills | Status: DC

## 2018-10-12 MED ORDER — ACCU-CHEK FASTCLIX LANCETS MISC: 12 refills | Status: DC

## 2018-10-12 MED ORDER — LISINOPRIL 5 MG PO TABS: 5.0000 mg | ORAL_TABLET | Freq: Every day | ORAL | 3 refills | Status: DC

## 2018-10-12 MED ORDER — ATORVASTATIN CALCIUM 20 MG PO TABS: 20.0000 mg | ORAL_TABLET | Freq: Every day | ORAL | 3 refills | Status: DC

## 2018-10-12 MED FILL — JARDIANCE 10 MG TABLET: 10 | 90 days supply | Qty: 90 | Fill #0

## 2018-10-12 MED FILL — ACCU-CHEK GUIDE TEST STRIP: 30 days supply | Qty: 100 | Fill #0

## 2018-10-12 MED FILL — ACCU-CHEK FASTCLIX LANCETS: 30 days supply | Qty: 102 | Fill #0

## 2018-10-12 MED FILL — GLIMEPIRIDE 4 MG TABS: 4 | 90 days supply | Qty: 90 | Fill #0

## 2018-10-12 NOTE — Progress Notes (Signed)
Assessment & Plan:  Dajha was seen today for follow-up.  Diagnoses and all orders for this visit:  Uncontrolled type 2 diabetes mellitus with hyperglycemia (HCC) -     Glucose (CBG) -     Basic metabolic panel -     HgB Z7Q  Mixed hyperlipidemia -     Lipid panel -     atorvastatin (LIPITOR) 20 MG tablet; Take 1 tablet (20 mg total) by mouth daily at 6 PM.  Diabetes mellitus type 2 with complications, uncontrolled (HCC) -     metFORMIN (GLUCOPHAGE) 1000 MG tablet; Take 1 tablet (1,000 mg total) by mouth daily with breakfast. -     lisinopril (ZESTRIL) 5 MG tablet; Take 1 tablet (5 mg total) by mouth daily. -     glucose blood (ACCU-CHEK GUIDE) test strip; Use  2 times as directed once daily -     Accu-Chek FastClix Lancets MISC; Use as directed once daily  Other orders -     Discontinue: glimepiride (AMARYL) 4 MG tablet; Take 1 tablet (4 mg total) by mouth daily before breakfast. -     empagliflozin (JARDIANCE) 10 MG TABS tablet; Take 10 mg by mouth daily before breakfast.    Patient has been counseled on age-appropriate routine health concerns for screening and prevention. These are reviewed and up-to-date. Referrals have been placed accordingly. Immunizations are up-to-date or declined.    Subjective:   Chief Complaint  Patient presents with  . Follow-up    Pt. is here to follow up on diabetes and cholesterol.    HPI Veronica Love 33 y.o. female presents to office today for DM and Lipids. An onsite interpreter was used to communicate directly with patient for the entire encounter including providing detailed patient instructions.    DM TYPE 2 Current medications: victoza 1.53m daily and metformin 1000 mg daily. Also on STATIN and ACE. Fasting average readings: 120s. Postprandial: 190s. Highest 200s. She endorses dizziness since taking victoza. Will start Jardiance. She is to continue metformin 1000 mg daily (she can not tolerate metformin 1000 mg BID due to GI issues). She  denies any hyperglycemic symptoms. Overdue for eye exam. Referral has been placed.  Lab Results  Component Value Date   HGBA1C 8.2 (A) 10/12/2018   Lab Results  Component Value Date   HGBA1C 6.6 (A) 07/08/2018    Mixed Hyperlipidemia Taking lipitor 24mdaily as prescribed. Denies any statin intolerance or myalgias. LDL not at goal. Repeat Lipid panel pending  Lab Results  Component Value Date   LDLCALC 108 (H) 07/08/2018   Review of Systems  Constitutional: Negative for fever, malaise/fatigue and weight loss.  HENT: Negative.  Negative for nosebleeds.   Eyes: Negative.  Negative for blurred vision, double vision and photophobia.  Respiratory: Negative.  Negative for cough and shortness of breath.   Cardiovascular: Negative.  Negative for chest pain, palpitations and leg swelling.  Gastrointestinal: Negative.  Negative for heartburn, nausea and vomiting.  Musculoskeletal: Negative.  Negative for myalgias.  Neurological: Positive for dizziness. Negative for focal weakness, seizures and headaches.  Psychiatric/Behavioral: Negative.  Negative for suicidal ideas.    Past Medical History:  Diagnosis Date  . Diabetes mellitus type 2, [place]  . Gestational diabetes 2013  . 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 No pertinent past medical history     Past Surgical History:  Procedure Laterality Date  . NO PAST SURGERIES  Family History  Problem Relation Age of Onset  . Diabetes Father     Social History Reviewed with no changes to be made today.   Outpatient Medications Prior to Visit  Medication Sig Dispense Refill  . Blood Glucose Monitoring Suppl (ACCU-CHEK GUIDE) w/Device KIT 1 each by Does not apply route daily. 1 kit 0  . etonogestrel (NEXPLANON) 68 MG IMPL implant Inject 1 each (68 mg total) into the skin once. 1 each 0  . ACCU-CHEK FASTCLIX LANCETS MISC Use as directed once daily 102 each 12  . atorvastatin  (LIPITOR) 20 MG tablet Take 1 tablet (20 mg total) by mouth daily. 90 tablet 3  . glucose blood (ACCU-CHEK GUIDE) test strip Use  2 times as directed once daily 100 each 12  . Insulin Pen Needle (TRUEPLUS PEN NEEDLES) 32G X 4 MM MISC Use to inject Victoza daily. 100 each 11  . liraglutide (VICTOZA) 18 MG/3ML SOPN SubQ:  Inject 0.6 mg once daily into the skin. Week 2: increase to 1.2 mg once daily; week 3: increase to 1.8 mg once daily 15 mL 6  . lisinopril (PRINIVIL,ZESTRIL) 5 MG tablet Take 1 tablet (5 mg total) by mouth daily. 90 tablet 3  . TRUEPLUS LANCETS 28G MISC Use as instructed. Monitor blood glucose levels twice per day. 100 each 3  . cetirizine (ZYRTEC) 10 MG tablet Take 1 tablet (10 mg total) by mouth daily. (Patient not taking: Reported on 06/02/2018) 90 tablet 3  . metFORMIN (GLUCOPHAGE) 1000 MG tablet Take 1 tablet (1,000 mg total) by mouth daily with breakfast. 90 tablet 2   Facility-Administered Medications Prior to Visit  Medication Dose Route Frequency Provider Last Rate Last Dose  . insulin aspart (novoLOG) injection 20 Units  20 Units Subcutaneous Once Chari Manning A, NP        No Known Allergies     Objective:    BP 107/67 (BP Location: Right Arm, Patient Position: Sitting, Cuff Size: Normal)   Pulse 89   Temp 98.7 F (37.1 C) (Oral)   Ht '5\' 1"'  (1.549 m)   Wt 136 lb (61.7 kg)   SpO2 100%   BMI 25.70 kg/m  Wt Readings from Last 3 Encounters:  10/12/18 136 lb (61.7 kg)  07/08/18 134 lb (60.8 kg)  06/02/18 132 lb 3.2 oz (60 kg)    Physical Exam Vitals signs and nursing note reviewed.  Constitutional:      Appearance: She is well-developed.  HENT:     Head: Normocephalic and atraumatic.  Neck:     Musculoskeletal: Normal range of motion.  Cardiovascular:     Rate and Rhythm: Normal rate and regular rhythm.     Heart sounds: Normal heart sounds. No murmur. No friction rub. No gallop.   Pulmonary:     Effort: Pulmonary effort is normal. No tachypnea or  respiratory distress.     Breath sounds: Normal breath sounds. No decreased breath sounds, wheezing, rhonchi or rales.  Chest:     Chest wall: No tenderness.  Abdominal:     General: Bowel sounds are normal.     Palpations: Abdomen is soft.  Musculoskeletal: Normal range of motion.  Skin:    General: Skin is warm and dry.  Neurological:     Mental Status: She is alert and oriented to person, place, and time.     Coordination: Coordination normal.  Psychiatric:        Behavior: Behavior normal. Behavior is cooperative.  Thought Content: Thought content normal.        Judgment: Judgment normal.        Patient has been counseled extensively about nutrition and exercise as well as the importance of adherence with medications and regular follow-up. The patient was given clear instructions to go to ER or return to medical center if symptoms don't improve, worsen or new problems develop. The patient verbalized understanding.   Follow-up: Return in about 6 weeks (around 11/23/2018) for bring meter/luke/ started on jardiance. Gildardo Pounds, FNP-BC [facility] and [facility] [place], [place]   10/12/2018, 12:19 PM

## 2018-10-13 LAB — BASIC METABOLIC PANEL
BUN/Creatinine Ratio: 18 (ref 9–23)
BUN: 11 mg/dL (ref 6–20)
CO2: 26 mmol/L (ref 20–29)
Calcium: 9.9 mg/dL (ref 8.7–10.2)
Chloride: 102 mmol/L (ref 96–106)
Creatinine, Ser: 0.6 mg/dL (ref 0.57–1.00)
GFR calc Af Amer: 137 mL/min/{1.73_m2} (ref >59)
GFR calc non Af Amer: 118 mL/min/{1.73_m2} (ref >59)
Glucose: 146 mg/dL — ABNORMAL HIGH (ref 65–99)
Potassium: 4.2 mmol/L (ref 3.5–5.2)
Sodium: 139 mmol/L (ref 134–144)

## 2018-10-13 LAB — LIPID PANEL
Chol/HDL Ratio: 3.4 ratio (ref 0.0–4.4)
Cholesterol, Total: 162 mg/dL (ref 100–199)
HDL: 47 mg/dL (ref >39)
LDL Chol Calc (NIH): 98 mg/dL (ref 0–99)
Triglycerides: 91 mg/dL (ref 0–149)
VLDL Cholesterol Cal: 17 mg/dL (ref 5–40)

## 2018-10-28 MED FILL — JARDIANCE 10 MG TABLET: 10 | 90 days supply | Qty: 90 | Fill #0

## 2018-11-20 MED FILL — ATORVASTATIN CALCIUM 20 MG: 20 | 90 days supply | Qty: 90 | Fill #0

## 2018-11-20 MED FILL — metFORMIN HCL 1000 MG TABS: 1000 | 90 days supply | Qty: 90 | Fill #0

## 2018-11-20 MED FILL — LISINOPRIL 5 MG TABLET: 5 | 90 days supply | Qty: 90 | Fill #0

## 2018-11-23 ENCOUNTER — Other Ambulatory Visit: Payer: Self-pay

## 2018-11-23 ENCOUNTER — Ambulatory Visit: Payer: Medicaid Other | Attending: Nurse Practitioner | Admitting: Pharmacist

## 2018-11-23 DIAGNOSIS — E1165 Type 2 diabetes mellitus with hyperglycemia: Secondary | ICD-10-CM | POA: Diagnosis not present

## 2018-11-23 DIAGNOSIS — Z833 Family history of diabetes mellitus: Secondary | ICD-10-CM | POA: Insufficient documentation

## 2018-11-23 DIAGNOSIS — Z7984 Long term (current) use of oral hypoglycemic drugs: Secondary | ICD-10-CM | POA: Diagnosis not present

## 2018-11-23 DIAGNOSIS — Z23 Encounter for immunization: Secondary | ICD-10-CM | POA: Diagnosis not present

## 2018-11-23 DIAGNOSIS — I10 Essential (primary) hypertension: Secondary | ICD-10-CM | POA: Insufficient documentation

## 2018-11-23 DIAGNOSIS — Z79899 Other long term (current) drug therapy: Secondary | ICD-10-CM | POA: Diagnosis not present

## 2018-11-23 DIAGNOSIS — E119 Type 2 diabetes mellitus without complications: Secondary | ICD-10-CM | POA: Insufficient documentation

## 2018-11-23 DIAGNOSIS — E785 Hyperlipidemia, unspecified: Secondary | ICD-10-CM | POA: Insufficient documentation

## 2018-11-23 LAB — GLUCOSE, POCT (MANUAL RESULT ENTRY): POC Glucose: 131 mg/dl — AB (ref 70–99)

## 2018-11-23 MED FILL — JARDIANCE 10 MG TABLET: 10 | 90 days supply | Qty: 90 | Fill #0

## 2018-11-23 NOTE — Progress Notes (Signed)
    S:    PCP: Zelda No chief complaint on file.  Patient arrives in good spirits.  Presents for diabetes evaluation, education, and management Patient was referred and last seen by Primary Care Provider on 10/12/18.   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  Current diabetes medications include: metformin 1000 mg daily, Jardiance 10 mg daily Current hypertension medications include: lisinopril 5 mg daily Current hyperlipidemia medications include: atorvastatin 20 mg daily  Patient denies hypoglycemic events.  Patient reported dietary habits:  - Reports limiting carbs and sweets   Patient-reported exercise habits:  - reports 30 minutes, one day a week    Patient denies nocturia.  Patient denies neuropathy. Patient denies visual changes. Patient reports self foot exams.     O:  POCT: 131   Lab Results  Component Value Date   HGBA1C 8.2 (A) 10/12/2018   There were no vitals filed for this visit.  Lipid Panel     Component Value Date/Time   CHOL 162 10/12/2018 0942   TRIG 91 10/12/2018 0942   HDL 47 10/12/2018 0942   CHOLHDL 3.4 10/12/2018 0942   CHOLHDL 6.2 (H) 11/30/2014 1053   VLDL 42 (H) 11/30/2014 1053   LDLCALC 98 10/12/2018 0942   Home fasting CBG: 96 - 130s   2 hour post-prandial/random CBG: 130s - 160s. 2 outliers in 200-300s.   Clinical ASCVD: No  The ASCVD Risk score Mikey Bussing DC Jr., et al., 2013) failed to calculate for the following reasons:   The 2013 ASCVD risk score is only valid for ages 81 to 68   A/P: Diabetes longstanding currently uncontrolled but home sugars reveal improvement. Patient is able to verbalize appropriate hypoglycemia management plan. Patient is adherent with medication but is taking glimepiride and metformin instead of her Jardiance and metformin. Counseling provided. Pt knows to  stop glimepiride and pick-up/start her Jardiance.  -Continued current regimen.   -Extensively discussed pathophysiology of DM, recommended lifestyle interventions, dietary effects on glycemic control -Counseled on s/sx of and management of hypoglycemia -Next A1C anticipated 12/20.   ASCVD risk - primary prevention in patient with DM. Last LDL < 100. ASCVD risk score cannot be calculated d/t age.  Recommend to continue moderate intensity statin therapy.  -Continued atorvastatin 20 mg.   HM - flu shot due. UTD on PNA and tetanus.  - Fluarix given  Written patient instructions provided. Total time in face to face counseling 15 minutes.   Follow up PCP Clinic Visit 12/01/18.  Benard Halsted, PharmD, [place] [phone]

## 2018-11-23 NOTE — Patient Instructions (Signed)
Thank you for coming to see me today. Please do the following:  1. Stop glimepiride. 2. Continue metformin.  3. Continue Jardiance.  4. Continue checking blood sugars at home.  5. Continue making the lifestyle changes we've discussed together during our visit. Diet and exercise play a significant role in improving your blood sugars.  6. Follow-up with Endocrinology next week.   Hypoglycemia or low blood sugar:   Low blood sugar can happen quickly and may become an emergency if not treated right away.   While this shouldn't happen often, it can be brought upon if you skip a meal or do not eat enough. Also, if your insulin or other diabetes medications are dosed too high, this can cause your blood sugar to go to low.   Warning signs of low blood sugar include: 1. Feeling shaky or dizzy 2. Feeling weak or tired  3. Excessive hunger 4. Feeling anxious or upset  5. Sweating even when you aren't exercising  What to do if I experience low blood sugar? 1. Check your blood sugar with your meter. If lower than 70, proceed to step 2.  2. Treat with 3-4 glucose tablets or 3 packets of regular sugar. If these aren't around, you can try hard candy. Yet another option would be to drink 4 ounces of fruit juice or 6 ounces of REGULAR soda.  3. Re-check your sugar in 15 minutes. If it is still below 70, do what you did in step 2 again. If has come back up, go ahead and eat a snack or small meal at this time.

## 2018-11-27 ENCOUNTER — Other Ambulatory Visit: Payer: Self-pay

## 2018-12-01 ENCOUNTER — Encounter: Payer: Self-pay | Admitting: Internal Medicine

## 2018-12-01 ENCOUNTER — Ambulatory Visit (INDEPENDENT_AMBULATORY_CARE_PROVIDER_SITE_OTHER): Payer: Medicaid Other | Admitting: Internal Medicine

## 2018-12-01 VITALS — BP 122/70 | HR 86 | Resp 16 | Ht 61.0 in | Wt 138.0 lb

## 2018-12-01 DIAGNOSIS — E0590 Thyrotoxicosis, unspecified without thyrotoxic crisis or storm: Secondary | ICD-10-CM

## 2018-12-01 LAB — T4, FREE: Free T4: 0.96 ng/dL (ref 0.60–1.60)

## 2018-12-01 LAB — TSH: TSH: 0.26 u[IU]/mL — ABNORMAL LOW (ref 0.35–4.50)

## 2018-12-01 NOTE — Progress Notes (Signed)
Name: Veronica Love  MRN/ DOB: [id number], July 07, 1985    Age/ Sex: 33 y.o., female     PCP: Gildardo Pounds, NP   Reason for Endocrinology Evaluation: Low TSH      Initial Endocrinology Clinic Visit: 03/03/2018    PATIENT IDENTIFIER: Veronica Love is a 33 y.o., female with a past medical history of  Dyslipidemia and T2DM.Marland Kitchen She has followed with [facility] since 03/03/2018 for consultative assistance with management of her low TSH    HISTORICAL SUMMARY:  Pt was noted to have low TSH at 0.232 uIU/mL during routine lab workup in 08/2017. Repeat labs in 11/2017 confirmed similar results.  Thyroid ultrasound in 02/2018 showed no evidence of thyroid nodules.    SUBJECTIVE:   During last visit (06/02/2018): TSH continued to be low with normal FT4 and no symptoms. No intervention was offered.   Today (12/01/2018):  Veronica Love is here for 3 month follow up on subclinical hyperthyroidism.  She has noted some weight increase but denies palpitations, diarrhea, eye symptoms or local neck symptoms.   She is on Nexplanon   ROS:  As per HPI.   HISTORY:  Past Medical History:  Past Medical History:  Diagnosis Date  . Diabetes mellitus type 2, uncontrolled, with complications ([place])   . Gestational diabetes 2013  . 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 No pertinent past medical history    Past Surgical History:  Past Surgical History:  Procedure Laterality Date  . NO PAST SURGERIES      Social History:  reports that she has never smoked. She has never used smokeless tobacco. She reports that she does not drink alcohol or use drugs. Family History:  Family History  Problem Relation Age of Onset  . Diabetes Father      HOME MEDICATIONS: Allergies as of 12/01/2018   No Known Allergies     Medication List       Accurate as of December 01, 2018  9:51 AM. If you have any questions, ask your nurse or doctor.        Accu-Chek FastClix Lancets Misc Use  as directed once daily   Accu-Chek Guide test strip Generic drug: glucose blood Use  2 times as directed once daily   Accu-Chek Guide w/Device Kit 1 each by Does not apply route daily.   atorvastatin 20 MG tablet Commonly known as: LIPITOR Take 1 tablet (20 mg total) by mouth daily at 6 PM.   cetirizine 10 MG tablet Commonly known as: ZYRTEC Take 1 tablet (10 mg total) by mouth daily.   etonogestrel 68 MG Impl implant Commonly known as: Nexplanon Inject 1 each (68 mg total) into the skin once.   Jardiance 10 MG Tabs tablet Generic drug: empagliflozin Take 10 mg by mouth daily before breakfast.   lisinopril 5 MG tablet Commonly known as: ZESTRIL Take 1 tablet (5 mg total) by mouth daily.   metFORMIN 1000 MG tablet Commonly known as: GLUCOPHAGE Take 1 tablet (1,000 mg total) by mouth daily with breakfast.         OBJECTIVE:   PHYSICAL EXAM: VS: BP 122/70 (BP Location: Left Arm, Patient Position: Sitting, Cuff Size: Normal)   Pulse 86   Resp 16   Ht '5\' 1"'  (1.549 m)   Wt 138 lb (62.6 kg)   SpO2 99%   BMI 26.07 kg/m    EXAM: General: Pt appears well and is in NAD  Neck: General: Supple without adenopathy. Thyroid: Thyoid asymmetry noted R> L lobe.  No goiter or nodules appreciated. No thyroid bruit.  Lungs: Clear with good BS bilat with no rales, rhonchi, or wheezes  Heart: Auscultation: RRR.  Abdomen: Normoactive bowel sounds, soft, nontender, without masses or organomegaly palpable  Extremities:  BL LE: No pretibial edema normal ROM and strength.  Mental Status: Judgment, insight: Intact Orientation: Oriented to time, place, and person Mood and affect: No depression, anxiety, or agitation     DATA REVIEWED:   Results for Veronica Love, Veronica Love (MRN [id number]) as of 06/02/2018 07:55  Ref. Range 03/03/2018 09:10  TRAB Latest Ref Range: <=2.00 IU/L 1.06    Thyroid Ultrasound 03/17/2018 There are no discrete nodules which meet criteria for biopsy nor follow-up. The  right lobe is larger than the left.    ASSESSMENT / PLAN / RECOMMENDATIONS:   1. 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  - Repeat TFT's today show improved TSH with normal T4. We will continue to monitor at this time with no intervention unless she develops overt hyperthyroid symptoms or her FT4 increases.   F/u in 6 months.    Signed electronically by: Mack Guise, MD  [facility]  [facility] [address], [place], [place] [address] Phone: [phone] FAX: [phone]      CC: Gildardo Pounds, NP [place] [address] Phone: [phone]  Fax: [phone]   Return to Endocrinology clinic as below: No future appointments.

## 2018-12-31 MED FILL — ACCU-CHEK GUIDE TEST STRIP: 30 days supply | Qty: 100 | Fill #1

## 2019-02-26 MED FILL — metFORMIN HCL 1000 MG TABS: 1000 | 90 days supply | Qty: 90 | Fill #1

## 2019-02-26 MED FILL — ACCU-CHEK GUIDE TEST STRIP: 30 days supply | Qty: 100 | Fill #3

## 2019-02-26 MED FILL — JARDIANCE 10 MG TABLET: 10 | 90 days supply | Qty: 90 | Fill #1

## 2019-02-26 MED FILL — ACCU-CHEK FASTCLIX LANCETS: 30 days supply | Qty: 102 | Fill #1

## 2019-03-17 MED FILL — JARDIANCE 10 MG TABLET: 10 | 90 days supply | Qty: 90 | Fill #1

## 2019-04-01 ENCOUNTER — Encounter: Payer: Self-pay | Admitting: Nurse Practitioner

## 2019-04-01 ENCOUNTER — Other Ambulatory Visit: Payer: Self-pay

## 2019-04-01 ENCOUNTER — Ambulatory Visit: Payer: Medicaid Other | Attending: Nurse Practitioner | Admitting: Nurse Practitioner

## 2019-04-01 VITALS — BP 113/76 | HR 86 | Temp 97.7°F | Wt 136.0 lb

## 2019-04-01 DIAGNOSIS — Z794 Long term (current) use of insulin: Secondary | ICD-10-CM | POA: Insufficient documentation

## 2019-04-01 DIAGNOSIS — K8020 Calculus of gallbladder without cholecystitis without obstruction: Secondary | ICD-10-CM | POA: Diagnosis not present

## 2019-04-01 DIAGNOSIS — Z833 Family history of diabetes mellitus: Secondary | ICD-10-CM | POA: Insufficient documentation

## 2019-04-01 DIAGNOSIS — E0590 Thyrotoxicosis, unspecified without thyrotoxic crisis or storm: Secondary | ICD-10-CM | POA: Diagnosis not present

## 2019-04-01 DIAGNOSIS — E785 Hyperlipidemia, unspecified: Secondary | ICD-10-CM | POA: Diagnosis not present

## 2019-04-01 DIAGNOSIS — E1165 Type 2 diabetes mellitus with hyperglycemia: Secondary | ICD-10-CM | POA: Diagnosis not present

## 2019-04-01 DIAGNOSIS — K8080 Other cholelithiasis without obstruction: Secondary | ICD-10-CM | POA: Insufficient documentation

## 2019-04-01 DIAGNOSIS — Z79899 Other long term (current) drug therapy: Secondary | ICD-10-CM | POA: Insufficient documentation

## 2019-04-01 LAB — POCT GLYCOSYLATED HEMOGLOBIN (HGB A1C): Hemoglobin A1C: 7.9 % — AB (ref 4.0–5.6)

## 2019-04-01 LAB — GLUCOSE, POCT (MANUAL RESULT ENTRY): POC Glucose: 127 mg/dl — AB (ref 70–99)

## 2019-04-01 NOTE — Progress Notes (Signed)
Assessment & Plan:  [name] was seen today for follow-up.  Diagnoses and all orders for this visit:  Uncontrolled type 2 diabetes mellitus with hyperglycemia ([place]) -     Glucose (CBG) -     HgB A1c -     Microalbumin/Creatinine Ratio, Urine -     Ambulatory referral to Ophthalmology -     CMP14+EGFR Continue blood sugar control as discussed in office today, low carbohydrate diet, and regular physical exercise as tolerated, 150 minutes per week (30 min each day, 5 days per week, or 50 min 3 days per week). Keep blood sugar logs with fasting goal of 90-130 mg/dl, post prandial (after you eat) less than 180.  For Hypoglycemia: BS <60 and Hyperglycemia BS >400; contact the clinic ASAP. Annual eye exams and foot exams are recommended.   Dyslipidemia -     Lipid panel INSTRUCTIONS: Work on a low fat, heart healthy diet and participate in regular aerobic exercise program by working out at least 150 minutes per week; 5 days a week-30 minutes per day. Avoid red meat/beef/steak,  fried foods. junk foods, sodas, sugary drinks, unhealthy snacking, alcohol and smoking.  Drink at least 80 oz of water per day and monitor your carbohydrate intake daily.    Gallstones -     CBC -     CMP14+EGFR -     Urinalysis, Complete    Patient has been counseled on age-appropriate routine health concerns for screening and prevention. These are reviewed and up-to-date. Referrals have been placed accordingly. Immunizations are up-to-date or declined.    Subjective:   Chief Complaint  Patient presents with  . Follow-up    Pt. is here to follow up on diabetes.    HPI Veronica Love 34 y.o. female presents to office today for follow up. She sees Dr. Kelton Pillar for subclinical hyperthyroidism. Wait and Watch at this time. Next F/u May 2021. She has an in person interpreter with her today however patient speaks English for the majority of the visit.   DM TYPE 2 Has not been well controlled. Current medications  include: metformin 1000 mg BID and glimepiride 4 mg daily. She is on low dose ACE and STATIN. She has her meter with her today with the following  Averages: 7 day 134; 14 day 142; 30 day 150; 90 day 152. She is overdue for eye exam. Will place another referral today.  Lab Results  Component Value Date   HGBA1C 7.9 (A) 04/01/2019   Lab Results  Component Value Date   HGBA1C 8.2 (A) 10/12/2018    Dyslipidemia LDL not at goal of <70. Taking atorvastatin 20 mg daily however not consistently diet adherent.  Lab Results  Component Value Date   LDLCALC 98 10/12/2018    History of gallstones Endorses infrequent right quadrant abdominal pain. Lasting only a few seconds-minutes and then goes away. Pain is tolerable. Denies N/V/D  Review of Systems  Constitutional: Negative for fever, malaise/fatigue and weight loss.  HENT: Negative.  Negative for nosebleeds.   Eyes: Negative.  Negative for blurred vision, double vision and photophobia.  Respiratory: Negative.  Negative for cough and shortness of breath.   Cardiovascular: Negative.  Negative for chest pain, palpitations and leg swelling.  Gastrointestinal: Negative.  Negative for heartburn, nausea and vomiting.       SEE HPI  Musculoskeletal: Negative.  Negative for myalgias.  Neurological: Negative.  Negative for dizziness, focal weakness, seizures and headaches.  Psychiatric/Behavioral: Negative.  Negative for suicidal  ideas.    Past Medical History:  Diagnosis Date  . Diabetes mellitus type 2, uncontrolled, with complications ([place])   . Gestational diabetes 2013  . 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 No pertinent past medical history     Past Surgical History:  Procedure Laterality Date  . NO PAST SURGERIES      Family History  Problem Relation Age of Onset  . Diabetes Father     Social History Reviewed with no changes to be made today.   Outpatient Medications Prior to Visit  Medication Sig  Dispense Refill  . Accu-Chek FastClix Lancets MISC Use as directed once daily 102 each 12  . atorvastatin (LIPITOR) 20 MG tablet Take 1 tablet (20 mg total) by mouth daily at 6 PM. 90 tablet 3  . Blood Glucose Monitoring Suppl (ACCU-CHEK GUIDE) w/Device KIT 1 each by Does not apply route daily. 1 kit 0  . cetirizine (ZYRTEC) 10 MG tablet Take 1 tablet (10 mg total) by mouth daily. 90 tablet 3  . etonogestrel (NEXPLANON) 68 MG IMPL implant Inject 1 each (68 mg total) into the skin once. 1 each 0  . glucose blood (ACCU-CHEK GUIDE) test strip Use  2 times as directed once daily 100 each 12  . lisinopril (ZESTRIL) 5 MG tablet Take 1 tablet (5 mg total) by mouth daily. 90 tablet 3  . metFORMIN (GLUCOPHAGE) 1000 MG tablet Take 1 tablet (1,000 mg total) by mouth daily with breakfast. 90 tablet 2  . insulin aspart (novoLOG) injection 20 Units      No facility-administered medications prior to visit.    No Known Allergies     Objective:    BP 113/76 (BP Location: Left Arm, Patient Position: Sitting, Cuff Size: Normal)   Pulse 86   Temp 97.7 F (36.5 C) (Temporal)   Wt 136 lb (61.7 kg)   SpO2 100%   BMI 25.70 kg/m  Wt Readings from Last 3 Encounters:  04/01/19 136 lb (61.7 kg)  12/01/18 138 lb (62.6 kg)  10/12/18 136 lb (61.7 kg)    Physical Exam Vitals and nursing note reviewed.  Constitutional:      Appearance: She is well-developed.  HENT:     Head: Normocephalic and atraumatic.  Cardiovascular:     Rate and Rhythm: Normal rate and regular rhythm.     Pulses:          Dorsalis pedis pulses are 2+ on the right side and 2+ on the left side.       Posterior tibial pulses are 2+ on the right side and 2+ on the left side.     Heart sounds: Normal heart sounds. No murmur. No friction rub. No gallop.   Pulmonary:     Effort: Pulmonary effort is normal. No tachypnea or respiratory distress.     Breath sounds: Normal breath sounds. No decreased breath sounds, wheezing, rhonchi or  rales.  Chest:     Chest wall: No tenderness.  Abdominal:     General: Bowel sounds are normal.     Palpations: Abdomen is soft.  Musculoskeletal:        General: Normal range of motion.     Cervical back: Normal range of motion.  Feet:     Right foot:     Protective Sensation: 10 sites tested. 10 sites sensed.     Skin integrity: No blister or skin breakdown.     Left foot:  Protective Sensation: 10 sites tested. 10 sites sensed.     Skin integrity: No blister or skin breakdown.  Skin:    General: Skin is warm and dry.  Neurological:     Mental Status: She is alert and oriented to person, place, and time.     Coordination: Coordination normal.  Psychiatric:        Behavior: Behavior normal. Behavior is cooperative.        Thought Content: Thought content normal.        Judgment: Judgment normal.        Patient has been counseled extensively about nutrition and exercise as well as the importance of adherence with medications and regular follow-up. The patient was given clear instructions to go to ER or return to medical center if symptoms don't improve, worsen or new problems develop. The patient verbalized understanding.   Follow-up: No follow-ups on file.   Gildardo Pounds, FNP-BC [facility] and [place], [place]   04/01/2019, 12:53 PM

## 2019-04-02 LAB — MICROSCOPIC EXAMINATION
Bacteria, UA: NONE SEEN
Casts: NONE SEEN /lpf

## 2019-04-02 LAB — LIPID PANEL
Chol/HDL Ratio: 2.8 ratio (ref 0.0–4.4)
Cholesterol, Total: 145 mg/dL (ref 100–199)
HDL: 51 mg/dL (ref >39)
LDL Chol Calc (NIH): 75 mg/dL (ref 0–99)
Triglycerides: 101 mg/dL (ref 0–149)
VLDL Cholesterol Cal: 19 mg/dL (ref 5–40)

## 2019-04-02 LAB — URINALYSIS, COMPLETE
Bilirubin, UA: NEGATIVE
Ketones, UA: NEGATIVE
Leukocytes,UA: NEGATIVE
Nitrite, UA: NEGATIVE
Protein,UA: NEGATIVE
RBC, UA: NEGATIVE
Specific Gravity, UA: 1.027 (ref 1.005–1.030)
Urobilinogen, Ur: 0.2 mg/dL (ref 0.2–1.0)
pH, UA: 5.5 (ref 5.0–7.5)

## 2019-04-02 LAB — CBC
Hematocrit: 45 % (ref 34.0–46.6)
Hemoglobin: 14.9 g/dL (ref 11.1–15.9)
MCH: 24.8 pg — ABNORMAL LOW (ref 26.6–33.0)
MCHC: 33.1 g/dL (ref 31.5–35.7)
MCV: 75 fL — ABNORMAL LOW (ref 79–97)
Platelets: 236 10*3/uL (ref 150–450)
RBC: 6 x10E6/uL — ABNORMAL HIGH (ref 3.77–5.28)
RDW: 14.8 % (ref 11.7–15.4)
WBC: 8.1 10*3/uL (ref 3.4–10.8)

## 2019-04-02 LAB — CMP14+EGFR
ALT: 21 IU/L (ref 0–32)
AST: 16 IU/L (ref 0–40)
Albumin/Globulin Ratio: 1.4 (ref 1.2–2.2)
Albumin: 5 g/dL — ABNORMAL HIGH (ref 3.8–4.8)
Alkaline Phosphatase: 56 IU/L (ref 39–117)
BUN/Creatinine Ratio: 17 (ref 9–23)
BUN: 13 mg/dL (ref 6–20)
Bilirubin Total: 0.3 mg/dL (ref 0.0–1.2)
CO2: 20 mmol/L (ref 20–29)
Calcium: 9.9 mg/dL (ref 8.7–10.2)
Chloride: 103 mmol/L (ref 96–106)
Creatinine, Ser: 0.75 mg/dL (ref 0.57–1.00)
GFR calc Af Amer: 119 mL/min/{1.73_m2} (ref >59)
GFR calc non Af Amer: 104 mL/min/{1.73_m2} (ref >59)
Globulin, Total: 3.5 g/dL (ref 1.5–4.5)
Glucose: 98 mg/dL (ref 65–99)
Potassium: 4.1 mmol/L (ref 3.5–5.2)
Sodium: 142 mmol/L (ref 134–144)
Total Protein: 8.5 g/dL (ref 6.0–8.5)

## 2019-04-02 LAB — MICROALBUMIN / CREATININE URINE RATIO
Creatinine, Urine: 78.4 mg/dL
Microalb/Creat Ratio: 5 mg/g creat (ref 0–29)
Microalbumin, Urine: 4.1 ug/mL

## 2019-04-08 MED FILL — ACCU-CHEK GUIDE TEST STRIP: 30 days supply | Qty: 100 | Fill #2

## 2019-04-08 MED FILL — ATORVASTATIN CALCIUM 20 MG: 20 | 90 days supply | Qty: 90 | Fill #1

## 2019-04-08 MED FILL — LISINOPRIL 5 MG TABLET: 5 | 90 days supply | Qty: 90 | Fill #1

## 2019-05-31 ENCOUNTER — Other Ambulatory Visit: Payer: Self-pay

## 2019-06-02 ENCOUNTER — Encounter: Payer: Self-pay | Admitting: Internal Medicine

## 2019-06-02 ENCOUNTER — Other Ambulatory Visit: Payer: Self-pay

## 2019-06-02 ENCOUNTER — Ambulatory Visit: Payer: Medicaid Other | Admitting: Internal Medicine

## 2019-06-02 ENCOUNTER — Ambulatory Visit (INDEPENDENT_AMBULATORY_CARE_PROVIDER_SITE_OTHER): Payer: Medicaid Other | Admitting: Internal Medicine

## 2019-06-02 VITALS — BP 120/74 | HR 82 | Temp 98.3°F | Ht 61.0 in | Wt 134.6 lb

## 2019-06-02 DIAGNOSIS — E0590 Thyrotoxicosis, unspecified without thyrotoxic crisis or storm: Secondary | ICD-10-CM | POA: Diagnosis not present

## 2019-06-02 NOTE — Progress Notes (Signed)
Name: Veronica Love  MRN/ DOB: [id number], 1985-10-06    Age/ Sex: 34 y.o., female     PCP: Gildardo Pounds, NP   Reason for Endocrinology Evaluation: Low TSH      Initial Endocrinology Clinic Visit: 03/03/2018    PATIENT IDENTIFIER: Veronica Love is a 34 y.o., female with a past medical history of  Dyslipidemia and T2DM.Marland Kitchen She has followed with [facility] since 03/03/2018 for consultative assistance with management of her low TSH    HISTORICAL SUMMARY:  Pt was noted to have low TSH at 0.232 uIU/mL during routine lab workup in 08/2017. Repeat labs in 11/2017 confirmed similar results.  Thyroid ultrasound in 02/2018 showed no evidence of thyroid nodules.    SUBJECTIVE:   During last visit (12/01/2018): TSH continued to be low with normal FT4 and no symptoms. No intervention was offered.   Today (06/03/2019):  [name] is here for 6 month follow up on subclinical hyperthyroidism. She is accompanied by an interpreter today   Her weight has been stable  Has noted dry throat and feels something stuck in her throat , but on swelling or pain   She denies palpitations, has occasional diarrhea Occasional eye burning and itching   She is on Nexplanon   ROS:  As per HPI.   HISTORY:  Past Medical History:  Past Medical History:  Diagnosis Date  . Diabetes mellitus type 2, uncontrolled, with complications ([place])   . Gestational diabetes 2013  . 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 No pertinent past medical history    Past Surgical History:  Past Surgical History:  Procedure Laterality Date  . NO PAST SURGERIES      Social History:  reports that she has never smoked. She has never used smokeless tobacco. She reports that she does not drink alcohol or use drugs. Family History:  Family History  Problem Relation Age of Onset  . Diabetes Father      HOME MEDICATIONS: Allergies as of 06/02/2019   No Known Allergies     Medication List       Accurate as of Jun 02, 2019 11:59 PM. If you have any questions, ask your nurse or doctor.        Accu-Chek FastClix Lancets Misc Use as directed once daily   Accu-Chek Guide test strip Generic drug: glucose blood Use  2 times as directed once daily   Accu-Chek Guide w/Device Kit 1 each by Does not apply route daily.   atorvastatin 20 MG tablet Commonly known as: LIPITOR Take 1 tablet (20 mg total) by mouth daily at 6 PM.   cetirizine 10 MG tablet Commonly known as: ZYRTEC Take 1 tablet (10 mg total) by mouth daily.   etonogestrel 68 MG Impl implant Commonly known as: Nexplanon Inject 1 each (68 mg total) into the skin once.   lisinopril 5 MG tablet Commonly known as: ZESTRIL Take 1 tablet (5 mg total) by mouth daily.   metFORMIN 1000 MG tablet Commonly known as: GLUCOPHAGE Take 1 tablet (1,000 mg total) by mouth daily with breakfast.         OBJECTIVE:   PHYSICAL EXAM: VS: BP 120/74 (BP Location: Left Arm, Patient Position: Sitting, Cuff Size: Normal)   Pulse 82   Temp 98.3 F (36.8 C) (Oral)   Ht '5\' 1"'  (1.549 m)   Wt 134 lb 9.6 oz (61.1 kg)   SpO2 99%   BMI 25.43  kg/m    EXAM: General: Pt appears well and is in NAD  Neck: General: Supple without adenopathy. Thyroid: Thyoid asymmetry noted R> L lobe.  No goiter or nodules appreciated. No thyroid bruit.  Lungs: Clear with good BS bilat with no rales, rhonchi, or wheezes  Heart: Auscultation: RRR.  Abdomen: Normoactive bowel sounds, soft, nontender, without masses or organomegaly palpable  Extremities:  BL LE: No pretibial edema normal ROM and strength.  Mental Status: Judgment, insight: Intact Orientation: Oriented to time, place, and person Mood and affect: No depression, anxiety, or agitation     DATA REVIEWED:   Results for ADESUWA, OSGOOD (MRN [id number]) as of 06/02/2018 07:55  Ref. Range 03/03/2018 09:10  TRAB Latest Ref Range: <=2.00 IU/L 1.06    Thyroid Ultrasound 03/17/2018 There are no  discrete nodules which meet criteria for biopsy nor follow-up. The right lobe is larger than the left.    ASSESSMENT / PLAN / RECOMMENDATIONS:   1. Subclinical hyperthyroidism :  - She continues to be clinically euthyroid  - TFT's have been stable  - Will proceed with thyroid uptake and scan , will discuss definitive treatment after the results.       F/u in 6 months.    Signed electronically by: Mack Guise, MD  [facility]  [facility] [address], [place], [place] [address] Phone: [phone] FAX: [phone]      CC: Gildardo Pounds, NP [place] [address] Phone: [phone]  Fax: [phone]   Return to Endocrinology clinic as below: Future Appointments  Date Time Provider [place]  06/07/2019 11:15 AM [facility] None  07/02/2019  8:30 AM Gildardo Pounds, NP [facility] None  12/03/2019  2:40 PM Zamariya Neal, Melanie Crazier, MD [facility] None

## 2019-06-03 DIAGNOSIS — H5213 Myopia, bilateral: Secondary | ICD-10-CM | POA: Diagnosis not present

## 2019-06-03 DIAGNOSIS — E119 Type 2 diabetes mellitus without complications: Secondary | ICD-10-CM | POA: Diagnosis not present

## 2019-06-03 DIAGNOSIS — H1045 Other chronic allergic conjunctivitis: Secondary | ICD-10-CM | POA: Diagnosis not present

## 2019-06-03 DIAGNOSIS — H52223 Regular astigmatism, bilateral: Secondary | ICD-10-CM | POA: Diagnosis not present

## 2019-06-03 DIAGNOSIS — H40013 Open angle with borderline findings, low risk, bilateral: Secondary | ICD-10-CM | POA: Diagnosis not present

## 2019-06-03 DIAGNOSIS — H11153 Pinguecula, bilateral: Secondary | ICD-10-CM | POA: Diagnosis not present

## 2019-06-03 LAB — HM DIABETES EYE EXAM

## 2019-06-07 ENCOUNTER — Other Ambulatory Visit (INDEPENDENT_AMBULATORY_CARE_PROVIDER_SITE_OTHER): Payer: Medicaid Other

## 2019-06-07 ENCOUNTER — Other Ambulatory Visit: Payer: Self-pay

## 2019-06-07 DIAGNOSIS — E0590 Thyrotoxicosis, unspecified without thyrotoxic crisis or storm: Secondary | ICD-10-CM

## 2019-06-07 LAB — T3: T3, Total: 123 ng/dL (ref 76–181)

## 2019-06-07 LAB — TSH: TSH: 0.1 u[IU]/mL — ABNORMAL LOW (ref 0.35–4.50)

## 2019-06-07 LAB — T4, FREE: Free T4: 0.97 ng/dL (ref 0.60–1.60)

## 2019-06-08 ENCOUNTER — Encounter: Payer: Self-pay | Admitting: Internal Medicine

## 2019-06-09 DIAGNOSIS — H5213 Myopia, bilateral: Secondary | ICD-10-CM | POA: Diagnosis not present

## 2019-06-22 ENCOUNTER — Other Ambulatory Visit: Payer: Self-pay | Admitting: Nurse Practitioner

## 2019-06-22 MED FILL — metFORMIN HCL 1000 MG TABS: 1000 | 90 days supply | Qty: 90 | Fill #2

## 2019-06-22 MED FILL — ACCU-CHEK GUIDE TEST STRIP: 30 days supply | Qty: 100 | Fill #3

## 2019-06-22 NOTE — Telephone Encounter (Signed)
Medication is not listed on patient's medication list. Will forward to PCP for review.

## 2019-06-25 MED FILL — JARDIANCE 10 MG TABLET: 10 | 90 days supply | Qty: 90 | Fill #0

## 2019-06-29 ENCOUNTER — Ambulatory Visit (HOSPITAL_COMMUNITY): Payer: Medicaid Other | Attending: Internal Medicine

## 2019-06-29 ENCOUNTER — Encounter (HOSPITAL_COMMUNITY): Payer: Medicaid Other

## 2019-06-30 ENCOUNTER — Encounter (HOSPITAL_COMMUNITY): Payer: Medicaid Other

## 2019-07-02 ENCOUNTER — Encounter: Payer: Self-pay | Admitting: Nurse Practitioner

## 2019-07-02 ENCOUNTER — Other Ambulatory Visit: Payer: Self-pay | Admitting: Nurse Practitioner

## 2019-07-02 ENCOUNTER — Other Ambulatory Visit: Payer: Self-pay

## 2019-07-02 ENCOUNTER — Ambulatory Visit: Payer: Medicaid Other | Attending: Nurse Practitioner | Admitting: Nurse Practitioner

## 2019-07-02 VITALS — BP 105/71 | HR 79 | Temp 97.7°F | Ht 61.0 in | Wt 135.8 lb

## 2019-07-02 DIAGNOSIS — E782 Mixed hyperlipidemia: Secondary | ICD-10-CM | POA: Diagnosis not present

## 2019-07-02 DIAGNOSIS — E0590 Thyrotoxicosis, unspecified without thyrotoxic crisis or storm: Secondary | ICD-10-CM | POA: Insufficient documentation

## 2019-07-02 DIAGNOSIS — Z833 Family history of diabetes mellitus: Secondary | ICD-10-CM | POA: Insufficient documentation

## 2019-07-02 DIAGNOSIS — Z79899 Other long term (current) drug therapy: Secondary | ICD-10-CM | POA: Insufficient documentation

## 2019-07-02 DIAGNOSIS — Z7984 Long term (current) use of oral hypoglycemic drugs: Secondary | ICD-10-CM | POA: Diagnosis not present

## 2019-07-02 DIAGNOSIS — G8929 Other chronic pain: Secondary | ICD-10-CM | POA: Diagnosis not present

## 2019-07-02 DIAGNOSIS — E1165 Type 2 diabetes mellitus with hyperglycemia: Secondary | ICD-10-CM | POA: Insufficient documentation

## 2019-07-02 DIAGNOSIS — Z7901 Long term (current) use of anticoagulants: Secondary | ICD-10-CM | POA: Diagnosis not present

## 2019-07-02 DIAGNOSIS — R109 Unspecified abdominal pain: Secondary | ICD-10-CM | POA: Diagnosis not present

## 2019-07-02 LAB — POCT GLYCOSYLATED HEMOGLOBIN (HGB A1C): Hemoglobin A1C: 7.4 % — AB (ref 4.0–5.6)

## 2019-07-02 LAB — GLUCOSE, POCT (MANUAL RESULT ENTRY): POC Glucose: 132 mg/dl — AB (ref 70–99)

## 2019-07-02 MED ORDER — METFORMIN HCL 500 MG PO TABS: 500.0000 mg | ORAL_TABLET | Freq: Two times a day (BID) | ORAL | 1 refills | Status: DC

## 2019-07-02 MED ORDER — ACCU-CHEK GUIDE VI STRP: ORAL_STRIP | 12 refills | Status: DC

## 2019-07-02 MED ORDER — ATORVASTATIN CALCIUM 20 MG PO TABS: 20.0000 mg | ORAL_TABLET | Freq: Every day | ORAL | 3 refills | Status: DC

## 2019-07-02 MED ORDER — LISINOPRIL 5 MG PO TABS: 5.0000 mg | ORAL_TABLET | Freq: Every day | ORAL | 3 refills | Status: DC

## 2019-07-02 MED ORDER — ACCU-CHEK FASTCLIX LANCETS MISC: 12 refills | Status: DC

## 2019-07-02 MED FILL — ATORVASTATIN CALCIUM 20 MG: 20 | 90 days supply | Qty: 90 | Fill #0

## 2019-07-02 MED FILL — LISINOPRIL 5 MG TABLET: 5 | 90 days supply | Qty: 90 | Fill #0

## 2019-07-02 MED FILL — ACCU-CHEK GUIDE TEST STRIP: 50 days supply | Qty: 100 | Fill #0

## 2019-07-02 MED FILL — ACCU-CHEK SOFTCLIX LANCETS: 50 days supply | Qty: 100 | Fill #0

## 2019-07-02 NOTE — Progress Notes (Signed)
Assessment & Plan:  Veronica Love was seen today for follow-up.  Diagnoses and all orders for this visit:  Uncontrolled type 2 diabetes mellitus with hyperglycemia (HCC) -     Glucose (CBG) -     HgB A1c -     metFORMIN (GLUCOPHAGE) 500 MG tablet; Take 1 tablet (500 mg total) by mouth 2 (two) times daily with a meal. -     glucose blood (ACCU-CHEK GUIDE) test strip; Use as instructed. Check blood glucose level by fingerstick twice per day.  E11.65 -     lisinopril (ZESTRIL) 5 MG tablet; Take 1 tablet (5 mg total) by mouth daily. -     Accu-Chek FastClix Lancets MISC; Use as instructed. Check blood glucose level by fingerstick twice per day.  E11.65 -     CMP14+EGFR Continue blood sugar control as discussed in office today, low carbohydrate diet, and regular physical exercise as tolerated, 150 minutes per week (30 min each day, 5 days per week, or 50 min 3 days per week). Keep blood sugar logs with fasting goal of 90-130 mg/dl, post prandial (after you eat) less than 180.  For Hypoglycemia: BS <60 and Hyperglycemia BS >400; contact the clinic ASAP. Annual eye exams and foot exams are recommended.   Mixed hyperlipidemia -     atorvastatin (LIPITOR) 20 MG tablet; Take 1 tablet (20 mg total) by mouth daily at 6 PM. INSTRUCTIONS: Work on a low fat, heart healthy diet and participate in regular aerobic exercise program by working out at least 150 minutes per week; 5 days a week-30 minutes per day. Avoid red meat/beef/steak,  fried foods. junk foods, sodas, sugary drinks, unhealthy snacking, alcohol and smoking.  Drink at least 80 oz of water per day and monitor your carbohydrate intake daily.    Chronic abdominal pain -     US Abdomen Complete; Future    Patient has been counseled on age-appropriate routine health concerns for screening and prevention. These are reviewed and up-to-date. Referrals have been placed accordingly. Immunizations are up-to-date or declined.    Subjective:   Chief  Complaint  Patient presents with  . Follow-up    Pt. is here for 3 months F.U for diabetes.    HPI Veronica Love 34 y.o. female presents to office today for follow up. has a past medical history of Diabetes mellitus type 2, uncontrolled, with complications ([place]), Gestational diabetes (2013), History of positive PPD (2009), and No pertinent past medical history.  She has an in person interpreter here with her today. She is seeing Dr. Kelton Pillar for subclinical hyperthyroidism.    DM TYPE 2 Taking metformin 1000 mg BID. Just started today jardiance 10 mg daily. Blood glucose levels have been.  She is on renal dose ACE and STATIN.  Average readings on blood glucose meter log:  7 day 180 14 day 158 30 day 156 90 day 158 Diabetes has improved. Still not at goal. She denies any hypo or hyperglycemic symptoms. LDL not at goal. Taking atorvastatin 20 mg daily.  Lab Results  Component Value Date   HGBA1C 7.4 (A) 07/02/2019   Lab Results  Component Value Date   HGBA1C 7.9 (A) 04/01/2019   Lab Results  Component Value Date   LDLCALC 75 04/01/2019     Abdominal Pain History of asymptomatic gallstones 2020. She was evaluated by GI 07-14-2018 and conservative measures were recommended at that time. Today she reports for the past 2-3 weeks she has been experiencing intense abdominal pain  which goes away after she has a bowel movement. Endorses nausea at times and denies constipation or diarrhea.   Review of Systems  Constitutional: Negative for fever, malaise/fatigue and weight loss.  HENT: Negative.  Negative for nosebleeds.   Eyes: Negative.  Negative for blurred vision, double vision and photophobia.  Respiratory: Negative.  Negative for cough and shortness of breath.   Cardiovascular: Negative.  Negative for chest pain, palpitations and leg swelling.  Gastrointestinal: Positive for abdominal pain and nausea. Negative for blood in stool, constipation, diarrhea, heartburn, melena and  vomiting.  Musculoskeletal: Negative.  Negative for myalgias.  Neurological: Negative.  Negative for dizziness, focal weakness, seizures and headaches.  Endo/Heme/Allergies: Negative for environmental allergies.  Psychiatric/Behavioral: Negative.  Negative for suicidal ideas.                                                                                 Past Medical History:  Diagnosis Date  . Diabetes mellitus type 2, uncontrolled, with complications ([place])   . Gestational diabetes 2013  . 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 No pertinent past medical history     Past Surgical History:  Procedure Laterality Date  . NO PAST SURGERIES      Family History  Problem Relation Age of Onset  . Diabetes Father     Social History Reviewed with no changes to be made today.   Outpatient Medications Prior to Visit  Medication Sig Dispense Refill  . Blood Glucose Monitoring Suppl (ACCU-CHEK GUIDE) w/Device KIT 1 each by Does not apply route daily. 1 kit 0  . cetirizine (ZYRTEC) 10 MG tablet Take 1 tablet (10 mg total) by mouth daily. 90 tablet 3  . etonogestrel (NEXPLANON) 68 MG IMPL implant Inject 1 each (68 mg total) into the skin once. 1 each 0  . JARDIANCE 10 MG TABS tablet TAKE 1 TABLET BY MOUTH DAILY BEFORE BREAKFAST. 90 tablet 1  . Accu-Chek FastClix Lancets MISC Use as directed once daily 102 each 12  . atorvastatin (LIPITOR) 20 MG tablet Take 1 tablet (20 mg total) by mouth daily at 6 PM. 90 tablet 3  . glucose blood (ACCU-CHEK GUIDE) test strip Use  2 times as directed once daily 100 each 12  . lisinopril (ZESTRIL) 5 MG tablet Take 1 tablet (5 mg total) by mouth daily. 90 tablet 3  . metFORMIN (GLUCOPHAGE) 1000 MG tablet Take 1 tablet (1,000 mg total) by mouth daily with breakfast. 90 tablet 2   No facility-administered medications prior to visit.    No Known Allergies     Objective:    BP 105/71 (BP Location: Left Arm, Patient Position: Sitting,  Cuff Size: Normal)   Pulse 79   Temp 97.7 F (36.5 C) (Temporal)   Ht _0  (1.549 m)   Wt 135 lb 12.8 oz (61.6 kg)   SpO2 98%   BMI 25.66 kg/m  Wt Readings from Last 3 Encounters:  07/02/19 135 lb 12.8 oz (61.6 kg)  06/02/19 134 lb 9.6 oz (61.1 kg)  04/01/19 136 lb (61.7 kg)    Physical Exam Vitals and nursing note reviewed.  Constitutional:  Appearance: She is well-developed.  HENT:     Head: Normocephalic and atraumatic.  Cardiovascular:     Rate and Rhythm: Normal rate and regular rhythm.     Heart sounds: Normal heart sounds. No murmur. No friction rub. No gallop.   Pulmonary:     Effort: Pulmonary effort is normal. No tachypnea or respiratory distress.     Breath sounds: Normal breath sounds. No decreased breath sounds, wheezing, rhonchi or rales.  Chest:     Chest wall: No tenderness.  Abdominal:     General: Bowel sounds are normal.     Palpations: Abdomen is soft.     Tenderness: There is no abdominal tenderness.  Musculoskeletal:        General: Normal range of motion.     Cervical back: Normal range of motion.  Skin:    General: Skin is warm and dry.  Neurological:     Mental Status: She is alert and oriented to person, place, and time.     Coordination: Coordination normal.  Psychiatric:        Behavior: Behavior normal. Behavior is cooperative.        Thought Content: Thought content normal.        Judgment: Judgment normal.          Patient has been counseled extensively about nutrition and exercise as well as the importance of adherence with medications and regular follow-up. The patient was given clear instructions to go to ER or return to medical center if symptoms don't improve, worsen or new problems develop. The patient verbalized understanding.   Follow-up: No follow-ups on file.   Gildardo Pounds, FNP-BC [facility] and [place], [place]   07/02/2019, 8:41 PM

## 2019-07-03 LAB — CMP14+EGFR
ALT: 18 IU/L (ref 0–32)
AST: 14 IU/L (ref 0–40)
Albumin/Globulin Ratio: 1.6 (ref 1.2–2.2)
Albumin: 5 g/dL — ABNORMAL HIGH (ref 3.8–4.8)
Alkaline Phosphatase: 58 IU/L (ref 48–121)
BUN/Creatinine Ratio: 17 (ref 9–23)
BUN: 12 mg/dL (ref 6–20)
Bilirubin Total: 0.3 mg/dL (ref 0.0–1.2)
CO2: 21 mmol/L (ref 20–29)
Calcium: 9.5 mg/dL (ref 8.7–10.2)
Chloride: 103 mmol/L (ref 96–106)
Creatinine, Ser: 0.69 mg/dL (ref 0.57–1.00)
GFR calc Af Amer: 130 mL/min/{1.73_m2} (ref >59)
GFR calc non Af Amer: 113 mL/min/{1.73_m2} (ref >59)
Globulin, Total: 3.2 g/dL (ref 1.5–4.5)
Glucose: 117 mg/dL — ABNORMAL HIGH (ref 65–99)
Potassium: 4.5 mmol/L (ref 3.5–5.2)
Sodium: 140 mmol/L (ref 134–144)
Total Protein: 8.2 g/dL (ref 6.0–8.5)

## 2019-07-19 ENCOUNTER — Other Ambulatory Visit: Payer: Self-pay | Admitting: Nurse Practitioner

## 2019-07-19 ENCOUNTER — Ambulatory Visit (HOSPITAL_COMMUNITY)
Admission: RE | Admit: 2019-07-19 | Discharge: 2019-07-19 | Disposition: A | Discharge status: Home / Self Care | Payer: Medicaid Other | Source: Ambulatory Visit | Attending: Nurse Practitioner | Admitting: Nurse Practitioner

## 2019-07-19 ENCOUNTER — Other Ambulatory Visit: Payer: Self-pay

## 2019-07-19 DIAGNOSIS — G8929 Other chronic pain: Secondary | ICD-10-CM | POA: Insufficient documentation

## 2019-07-19 DIAGNOSIS — R933 Abnormal findings on diagnostic imaging of other parts of digestive tract: Secondary | ICD-10-CM

## 2019-07-19 DIAGNOSIS — R109 Unspecified abdominal pain: Secondary | ICD-10-CM | POA: Insufficient documentation

## 2019-07-19 DIAGNOSIS — D7389 Other diseases of spleen: Secondary | ICD-10-CM | POA: Diagnosis not present

## 2019-07-20 MED FILL — ATORVASTATIN CALCIUM 20 MG: 20 | 90 days supply | Qty: 90 | Fill #2

## 2019-07-20 MED FILL — LISINOPRIL 5 MG TABLET: 5 | 90 days supply | Qty: 90 | Fill #2

## 2019-07-29 DIAGNOSIS — Z419 Encounter for procedure for purposes other than remedying health state, unspecified: Secondary | ICD-10-CM | POA: Diagnosis not present

## 2019-08-03 DIAGNOSIS — H5213 Myopia, bilateral: Secondary | ICD-10-CM | POA: Diagnosis not present

## 2019-08-05 ENCOUNTER — Telehealth: Payer: Self-pay | Admitting: Nurse Practitioner

## 2019-08-05 NOTE — Telephone Encounter (Signed)
FYI  Copied from CRM [id number]. Topic: Higher education careers adviser Patient (Clinic Use ONLY) >> Aug 05, 2019  2:08 PM [name], [place] wrote: Attempt to reach patient to inform on MRI schedule date and time. If patient call back please inform MRI is schedule at the [facility] hospital Radiology/imaging department on 08/20/2019 at 5:00 PM. Nothing to eat or drink 4 hrs prior her appt. Time. >> Aug 05, 2019  2:25 PM Leafy Ro wrote: Pt was given mri info and she will call to get mri rsc . Pt unable to go on 08/20/2019

## 2019-08-20 ENCOUNTER — Ambulatory Visit (HOSPITAL_COMMUNITY): Payer: Medicaid Other

## 2019-08-28 ENCOUNTER — Other Ambulatory Visit: Payer: Self-pay

## 2019-08-28 ENCOUNTER — Ambulatory Visit (HOSPITAL_COMMUNITY)
Admission: RE | Admit: 2019-08-28 | Discharge: 2019-08-28 | Disposition: A | Discharge status: Home / Self Care | Payer: Medicaid Other | Source: Ambulatory Visit | Attending: Nurse Practitioner | Admitting: Nurse Practitioner

## 2019-08-28 DIAGNOSIS — N281 Cyst of kidney, acquired: Secondary | ICD-10-CM | POA: Diagnosis not present

## 2019-08-28 DIAGNOSIS — D7389 Other diseases of spleen: Secondary | ICD-10-CM | POA: Diagnosis not present

## 2019-08-28 DIAGNOSIS — R933 Abnormal findings on diagnostic imaging of other parts of digestive tract: Secondary | ICD-10-CM | POA: Diagnosis not present

## 2019-08-28 MED ORDER — GADOBUTROL 1 MMOL/ML IV SOLN
6.0000 mL | Freq: Once | INTRAVENOUS | Status: AC | PRN
  Administered 2019-08-28: 6 mL via INTRAVENOUS

## 2019-08-29 DIAGNOSIS — Z419 Encounter for procedure for purposes other than remedying health state, unspecified: Secondary | ICD-10-CM | POA: Diagnosis not present

## 2019-08-30 ENCOUNTER — Other Ambulatory Visit: Payer: Self-pay | Admitting: Nurse Practitioner

## 2019-08-30 DIAGNOSIS — G8929 Other chronic pain: Secondary | ICD-10-CM

## 2019-09-06 ENCOUNTER — Other Ambulatory Visit: Payer: Self-pay

## 2019-09-06 ENCOUNTER — Ambulatory Visit: Payer: Medicaid Other | Attending: Nurse Practitioner

## 2019-09-06 DIAGNOSIS — R109 Unspecified abdominal pain: Secondary | ICD-10-CM | POA: Diagnosis not present

## 2019-09-06 DIAGNOSIS — G8929 Other chronic pain: Secondary | ICD-10-CM | POA: Diagnosis not present

## 2019-09-06 MED FILL — METFORMIN HCL 500 MG TABS: 500 | 90 days supply | Qty: 180 | Fill #0

## 2019-09-06 MED FILL — ACCU-CHEK GUIDE TEST STRIP: 50 days supply | Qty: 100 | Fill #0

## 2019-09-07 ENCOUNTER — Other Ambulatory Visit: Payer: Self-pay | Admitting: Nurse Practitioner

## 2019-09-07 LAB — H. PYLORI BREATH TEST: H pylori Breath Test: POSITIVE — AB

## 2019-09-07 MED ORDER — OMEPRAZOLE 20 MG PO CPDR: 20.0000 mg | DELAYED_RELEASE_CAPSULE | Freq: Two times a day (BID) | ORAL | 0 refills | Status: DC

## 2019-09-07 MED ORDER — AMOXICILLIN 500 MG PO TABS: 1000.0000 mg | ORAL_TABLET | Freq: Two times a day (BID) | ORAL | 0 refills | Status: AC

## 2019-09-07 MED ORDER — CLARITHROMYCIN 500 MG PO TABS
500.0000 mg | ORAL_TABLET | Freq: Two times a day (BID) | ORAL | 0 refills | Status: AC
Start: 2019-09-07 — End: 2019-09-21

## 2019-09-08 MED FILL — CLARITHROMYCIN 500 MG TAB: 500 | 14 days supply | Qty: 28 | Fill #0

## 2019-09-08 MED FILL — OMEPRAZOLE 20 MG CAP: 20 | 7 days supply | Qty: 14 | Fill #0

## 2019-09-08 MED FILL — AMOXICILLIN 500 MG CAPSULE: 500 | 14 days supply | Qty: 56 | Fill #0

## 2019-09-29 DIAGNOSIS — Z419 Encounter for procedure for purposes other than remedying health state, unspecified: Secondary | ICD-10-CM | POA: Diagnosis not present

## 2019-10-05 ENCOUNTER — Encounter: Payer: Self-pay | Admitting: Nurse Practitioner

## 2019-10-05 ENCOUNTER — Other Ambulatory Visit: Payer: Self-pay

## 2019-10-05 ENCOUNTER — Ambulatory Visit (HOSPITAL_BASED_OUTPATIENT_CLINIC_OR_DEPARTMENT_OTHER): Payer: Medicaid Other | Admitting: Pharmacist

## 2019-10-05 ENCOUNTER — Ambulatory Visit: Payer: Medicaid Other | Attending: Nurse Practitioner | Admitting: Nurse Practitioner

## 2019-10-05 ENCOUNTER — Other Ambulatory Visit: Payer: Self-pay | Admitting: Nurse Practitioner

## 2019-10-05 VITALS — BP 103/69 | HR 73 | Temp 97.9°F | Wt 136.0 lb

## 2019-10-05 DIAGNOSIS — R7989 Other specified abnormal findings of blood chemistry: Secondary | ICD-10-CM | POA: Diagnosis not present

## 2019-10-05 DIAGNOSIS — E1165 Type 2 diabetes mellitus with hyperglycemia: Secondary | ICD-10-CM | POA: Diagnosis not present

## 2019-10-05 DIAGNOSIS — Z23 Encounter for immunization: Secondary | ICD-10-CM

## 2019-10-05 DIAGNOSIS — E118 Type 2 diabetes mellitus with unspecified complications: Secondary | ICD-10-CM

## 2019-10-05 DIAGNOSIS — D7389 Other diseases of spleen: Secondary | ICD-10-CM | POA: Diagnosis not present

## 2019-10-05 DIAGNOSIS — E785 Hyperlipidemia, unspecified: Secondary | ICD-10-CM | POA: Diagnosis not present

## 2019-10-05 DIAGNOSIS — E782 Mixed hyperlipidemia: Secondary | ICD-10-CM | POA: Diagnosis not present

## 2019-10-05 DIAGNOSIS — Z1159 Encounter for screening for other viral diseases: Secondary | ICD-10-CM

## 2019-10-05 LAB — POCT GLYCOSYLATED HEMOGLOBIN (HGB A1C): Hemoglobin A1C: 7.5 % — AB (ref 4.0–5.6)

## 2019-10-05 LAB — GLUCOSE, POCT (MANUAL RESULT ENTRY): POC Glucose: 152 mg/dl — AB (ref 70–99)

## 2019-10-05 MED ORDER — ACCU-CHEK FASTCLIX LANCETS MISC: 12 refills | Status: DC

## 2019-10-05 MED ORDER — LISINOPRIL 5 MG PO TABS: 5.0000 mg | ORAL_TABLET | Freq: Every day | ORAL | 3 refills | Status: DC

## 2019-10-05 MED ORDER — AMOXICILLIN 500 MG PO CAPS: 1000.0000 mg | ORAL_CAPSULE | Freq: Two times a day (BID) | ORAL | 0 refills | Status: AC

## 2019-10-05 MED ORDER — EMPAGLIFLOZIN 25 MG PO TABS: 25.0000 mg | ORAL_TABLET | Freq: Every day | ORAL | 1 refills | Status: AC

## 2019-10-05 MED ORDER — ACCU-CHEK GUIDE VI STRP: ORAL_STRIP | 12 refills | Status: DC

## 2019-10-05 MED ORDER — ATORVASTATIN CALCIUM 20 MG PO TABS: 20.0000 mg | ORAL_TABLET | Freq: Every day | ORAL | 3 refills | Status: DC

## 2019-10-05 MED ORDER — OMEPRAZOLE 20 MG PO CPDR
20.0000 mg | DELAYED_RELEASE_CAPSULE | Freq: Two times a day (BID) | ORAL | 0 refills | Status: DC
Start: 2019-10-05 — End: 2020-01-05

## 2019-10-05 MED ORDER — METFORMIN HCL 500 MG PO TABS: 500.0000 mg | ORAL_TABLET | Freq: Two times a day (BID) | ORAL | 1 refills | Status: DC

## 2019-10-05 MED ORDER — DOCUSATE SODIUM 100 MG PO CAPS: 100.0000 mg | ORAL_CAPSULE | Freq: Two times a day (BID) | ORAL | 0 refills | Status: AC

## 2019-10-05 MED ORDER — CLARITHROMYCIN 500 MG PO TABS: 500.0000 mg | ORAL_TABLET | Freq: Two times a day (BID) | ORAL | 0 refills | Status: AC

## 2019-10-05 MED FILL — JARDIANCE 10 MG TABLET: 10 | 90 days supply | Qty: 90 | Fill #1

## 2019-10-05 MED FILL — ACCU-CHEK SOFTCLIX LANCETS: 50 days supply | Qty: 100 | Fill #0

## 2019-10-05 MED FILL — AMOXICILLIN 500 MG CAPSULE: 500 | 14 days supply | Qty: 56 | Fill #0

## 2019-10-05 MED FILL — OMEPRAZOLE 20 MG CAP: 20 | 7 days supply | Qty: 14 | Fill #0

## 2019-10-05 MED FILL — CLARITHROMYCIN 500 MG TAB: 500 | 14 days supply | Qty: 28 | Fill #0

## 2019-10-05 NOTE — Progress Notes (Signed)
Assessment & Plan:  Veronica Love was seen today for follow-up.  Diagnoses and all orders for this visit:  Type 2 diabetes mellitus with hyperglycemia, without long-term current use of insulin (HCC) -     Glucose (CBG) -     HgB A1c -     Basic metabolic panel -     Lipid panel Continue blood sugar control as discussed in office today, low carbohydrate diet, and regular physical exercise as tolerated, 150 minutes per week (30 min each day, 5 days per week, or 50 min 3 days per week). Keep blood sugar logs with fasting goal of 90-130 mg/dl, post prandial (after you eat) less than 180.  For Hypoglycemia: BS <60 and Hyperglycemia BS >400; contact the clinic ASAP. Annual eye exams and foot exams are recommended.  Low TSH level -     Thyroid Panel With TSH  Dyslipidemia -     Lipid panel INSTRUCTIONS: Work on a low fat, heart healthy diet and participate in regular aerobic exercise program by working out at least 150 minutes per week; 5 days a week-30 minutes per day. Avoid red meat/beef/steak,  fried foods. junk foods, sodas, sugary drinks, unhealthy snacking, alcohol and smoking.  Drink at least 80 oz of water per day and monitor your carbohydrate intake daily.    Abnormal CBC -     CBC  Need for hepatitis C screening test -     Hepatitis C Antibody    Patient has been counseled on age-appropriate routine health concerns for screening and prevention. These are reviewed and up-to-date. Referrals have been placed accordingly. Immunizations are up-to-date or declined.    Subjective:   Chief Complaint  Patient presents with  . Follow-up    Pt is here follow up.    HPI Veronica Love 34 y.o. female presents to office today for follow up to DM and HPL. She was treated with triple therapy for [facility] August 09-2019 but unfortunately she did not complete the treatment due to constipation she states was caused by the medication. I have instructed her that she will need to complete treatment and we  will need to restart. I have also send her colace.    She is currently being followed by endocrinology for her thyroid.    DM TYPE 2 Not well controlled. She has her meter with her today with average readings:  7 day 160 14 day 157 30 day 147 90 day 145 Currently taking Jardiance 25 mg daily, metformin 500 mg BID. Encouraged her to work on her diet and exercise in order to lower her A1c. Metformin 1000 mg BID causes GI upset. She denies any history of hypo or hyperglycemia. She is taking statin and renal dose ACE Lab Results  Component Value Date   HGBA1C 7.5 (A) 10/05/2019   Lab Results  Component Value Date   HGBA1C 7.4 (A) 07/02/2019    Dyslipidemia Taking atorvastatin 20 mg daily as prescribed. Denies statin intolerance or myalgias.  Lab Results  Component Value Date   LDLCALC 75 04/01/2019    Review of Systems  Constitutional: Negative for fever, malaise/fatigue and weight loss.  HENT: Negative.  Negative for nosebleeds.   Eyes: Negative.  Negative for blurred vision, double vision and photophobia.  Respiratory: Negative.  Negative for cough and shortness of breath.   Cardiovascular: Negative.  Negative for chest pain, palpitations and leg swelling.  Gastrointestinal: Positive for heartburn. Negative for nausea and vomiting.  Musculoskeletal: Negative.  Negative for myalgias.  Neurological: Negative.  Negative for dizziness, focal weakness, seizures and headaches.  Psychiatric/Behavioral: Negative.  Negative for suicidal ideas.    Past Medical History:  Diagnosis Date  . Diabetes mellitus type 2, uncontrolled, with complications ([place])   . Gestational diabetes 2013  . 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 No pertinent past medical history     Past Surgical History:  Procedure Laterality Date  . NO PAST SURGERIES      Family History  Problem Relation Age of Onset  . Diabetes Father     Social History Reviewed with no changes to be  made today.   Outpatient Medications Prior to Visit  Medication Sig Dispense Refill  . atorvastatin (LIPITOR) 20 MG tablet Take 1 tablet (20 mg total) by mouth daily at 6 PM. 90 tablet 3  . Blood Glucose Monitoring Suppl (ACCU-CHEK GUIDE) w/Device KIT 1 each by Does not apply route daily. 1 kit 0  . cetirizine (ZYRTEC) 10 MG tablet Take 1 tablet (10 mg total) by mouth daily. 90 tablet 3  . etonogestrel (NEXPLANON) 68 MG IMPL implant Inject 1 each (68 mg total) into the skin once. 1 each 0  . glucose blood (ACCU-CHEK GUIDE) test strip Use as instructed. Check blood glucose level by fingerstick twice per day.  E11.65 100 each 12  . JARDIANCE 10 MG TABS tablet TAKE 1 TABLET BY MOUTH DAILY BEFORE BREAKFAST. 90 tablet 1  . lisinopril (ZESTRIL) 5 MG tablet Take 1 tablet (5 mg total) by mouth daily. 90 tablet 3  . Accu-Chek FastClix Lancets MISC Use as instructed. Check blood glucose level by fingerstick twice per day.  E11.65 102 each 12  . metFORMIN (GLUCOPHAGE) 500 MG tablet Take 1 tablet (500 mg total) by mouth 2 (two) times daily with a meal. 180 tablet 1  . omeprazole (PRILOSEC) 20 MG capsule Take 1 capsule (20 mg total) by mouth 2 (two) times daily before a meal for 7 days. 14 capsule 0   No facility-administered medications prior to visit.    No Known Allergies     Objective:    BP 103/69 (BP Location: Left Arm, Patient Position: Sitting, Cuff Size: Normal)   Pulse 73   Temp 97.9 F (36.6 C) (Temporal)   Wt 136 lb (61.7 kg)   SpO2 99%   BMI 25.70 kg/m  Wt Readings from Last 3 Encounters:  10/05/19 136 lb (61.7 kg)  07/02/19 135 lb 12.8 oz (61.6 kg)  06/02/19 134 lb 9.6 oz (61.1 kg)    Physical Exam Vitals and nursing note reviewed.  Constitutional:      Appearance: She is well-developed.  HENT:     Head: Normocephalic and atraumatic.  Cardiovascular:     Rate and Rhythm: Normal rate and regular rhythm.     Heart sounds: Normal heart sounds. No murmur heard.  No friction  rub. No gallop.   Pulmonary:     Effort: Pulmonary effort is normal. No tachypnea or respiratory distress.     Breath sounds: Normal breath sounds. No decreased breath sounds, wheezing, rhonchi or rales.  Chest:     Chest wall: No tenderness.  Abdominal:     General: Bowel sounds are normal.     Palpations: Abdomen is soft.  Musculoskeletal:        General: Normal range of motion.     Cervical back: Normal range of motion.  Skin:    General: Skin is warm and dry.  Neurological:  Mental Status: She is alert and oriented to person, place, and time.     Coordination: Coordination normal.  Psychiatric:        Behavior: Behavior normal. Behavior is cooperative.        Thought Content: Thought content normal.        Judgment: Judgment normal.          Patient has been counseled extensively about nutrition and exercise as well as the importance of adherence with medications and regular follow-up. The patient was given clear instructions to go to ER or return to medical center if symptoms don't improve, worsen or new problems develop. The patient verbalized understanding.   Follow-up: Return in about 3 months (around 01/04/2020).   Gildardo Pounds, FNP-BC [facility] and [facility] [place], [place]   10/05/2019, 8:49 AM

## 2019-10-05 NOTE — Progress Notes (Signed)
Patient presents for vaccination against influenza per orders of Zelda. Consent given. Counseling provided. No contraindications exists. Vaccine administered without incident.  ° °Luke Van Ausdall, PharmD, CPP °Clinical Pharmacist °[facility] °[phone] ° °

## 2019-10-06 LAB — BASIC METABOLIC PANEL
BUN/Creatinine Ratio: 20 (ref 9–23)
BUN: 12 mg/dL (ref 6–20)
CO2: 21 mmol/L (ref 20–29)
Calcium: 10.1 mg/dL (ref 8.7–10.2)
Chloride: 99 mmol/L (ref 96–106)
Creatinine, Ser: 0.6 mg/dL (ref 0.57–1.00)
GFR calc Af Amer: 136 mL/min/{1.73_m2} (ref >59)
GFR calc non Af Amer: 118 mL/min/{1.73_m2} (ref >59)
Glucose: 134 mg/dL — ABNORMAL HIGH (ref 65–99)
Potassium: 4.6 mmol/L (ref 3.5–5.2)
Sodium: 137 mmol/L (ref 134–144)

## 2019-10-06 LAB — LIPID PANEL
Chol/HDL Ratio: 3.3 ratio (ref 0.0–4.4)
Cholesterol, Total: 164 mg/dL (ref 100–199)
HDL: 50 mg/dL (ref >39)
LDL Chol Calc (NIH): 94 mg/dL (ref 0–99)
Triglycerides: 109 mg/dL (ref 0–149)
VLDL Cholesterol Cal: 20 mg/dL (ref 5–40)

## 2019-10-06 LAB — CBC
Hematocrit: 44.3 % (ref 34.0–46.6)
Hemoglobin: 14.9 g/dL (ref 11.1–15.9)
MCH: 25.6 pg — ABNORMAL LOW (ref 26.6–33.0)
MCHC: 33.6 g/dL (ref 31.5–35.7)
MCV: 76 fL — ABNORMAL LOW (ref 79–97)
Platelets: 266 10*3/uL (ref 150–450)
RBC: 5.83 x10E6/uL — ABNORMAL HIGH (ref 3.77–5.28)
RDW: 15.4 % (ref 11.7–15.4)
WBC: 7.7 10*3/uL (ref 3.4–10.8)

## 2019-10-06 LAB — THYROID PANEL WITH TSH
Free Thyroxine Index: 2.3 (ref 1.2–4.9)
T3 Uptake Ratio: 27 % (ref 24–39)
T4, Total: 8.6 ug/dL (ref 4.5–12.0)
TSH: 0.265 u[IU]/mL — ABNORMAL LOW (ref 0.450–4.500)

## 2019-10-06 LAB — HEPATITIS C ANTIBODY: Hep C Virus Ab: <0.1 s/co ratio (ref 0.0–0.9)

## 2019-10-07 ENCOUNTER — Encounter: Payer: Self-pay | Admitting: Nurse Practitioner

## 2019-10-29 DIAGNOSIS — Z419 Encounter for procedure for purposes other than remedying health state, unspecified: Secondary | ICD-10-CM | POA: Diagnosis not present

## 2019-11-16 ENCOUNTER — Other Ambulatory Visit: Payer: Self-pay

## 2019-11-16 ENCOUNTER — Ambulatory Visit: Payer: Medicaid Other | Attending: Nurse Practitioner

## 2019-11-16 ENCOUNTER — Other Ambulatory Visit: Payer: Self-pay | Admitting: Nurse Practitioner

## 2019-11-16 DIAGNOSIS — R7989 Other specified abnormal findings of blood chemistry: Secondary | ICD-10-CM

## 2019-11-16 DIAGNOSIS — E1165 Type 2 diabetes mellitus with hyperglycemia: Secondary | ICD-10-CM

## 2019-11-16 DIAGNOSIS — E0590 Thyrotoxicosis, unspecified without thyrotoxic crisis or storm: Secondary | ICD-10-CM | POA: Diagnosis not present

## 2019-11-16 DIAGNOSIS — D508 Other iron deficiency anemias: Secondary | ICD-10-CM | POA: Diagnosis not present

## 2019-11-16 DIAGNOSIS — E785 Hyperlipidemia, unspecified: Secondary | ICD-10-CM

## 2019-11-16 MED FILL — LISINOPRIL 5 MG TABLET: 5 | 90 days supply | Qty: 90 | Fill #0

## 2019-11-16 MED FILL — ACCU-CHEK GUIDE TEST STRIP: 50 days supply | Qty: 100 | Fill #1

## 2019-11-16 MED FILL — ATORVASTATIN CALCIUM 20 MG: 20 | 90 days supply | Qty: 90 | Fill #0

## 2019-11-17 LAB — CMP14+EGFR
ALT: 15 IU/L (ref 0–32)
AST: 11 IU/L (ref 0–40)
Albumin/Globulin Ratio: 1.4 (ref 1.2–2.2)
Albumin: 4.6 g/dL (ref 3.8–4.8)
Alkaline Phosphatase: 59 IU/L (ref 44–121)
BUN/Creatinine Ratio: 18 (ref 9–23)
BUN: 12 mg/dL (ref 6–20)
Bilirubin Total: 0.3 mg/dL (ref 0.0–1.2)
CO2: 22 mmol/L (ref 20–29)
Calcium: 9.5 mg/dL (ref 8.7–10.2)
Chloride: 103 mmol/L (ref 96–106)
Creatinine, Ser: 0.65 mg/dL (ref 0.57–1.00)
GFR calc Af Amer: 132 mL/min/{1.73_m2} (ref >59)
GFR calc non Af Amer: 115 mL/min/{1.73_m2} (ref >59)
Globulin, Total: 3.2 g/dL (ref 1.5–4.5)
Glucose: 131 mg/dL — ABNORMAL HIGH (ref 65–99)
Potassium: 4.2 mmol/L (ref 3.5–5.2)
Sodium: 138 mmol/L (ref 134–144)
Total Protein: 7.8 g/dL (ref 6.0–8.5)

## 2019-11-17 LAB — CBC
Hematocrit: 41.5 % (ref 34.0–46.6)
Hemoglobin: 13.4 g/dL (ref 11.1–15.9)
MCH: 24.5 pg — ABNORMAL LOW (ref 26.6–33.0)
MCHC: 32.3 g/dL (ref 31.5–35.7)
MCV: 76 fL — ABNORMAL LOW (ref 79–97)
Platelets: 251 10*3/uL (ref 150–450)
RBC: 5.47 x10E6/uL — ABNORMAL HIGH (ref 3.77–5.28)
RDW: 14.5 % (ref 11.7–15.4)
WBC: 7.8 10*3/uL (ref 3.4–10.8)

## 2019-11-17 LAB — T3, FREE: T3, Free: 2.9 pg/mL (ref 2.0–4.4)

## 2019-11-17 LAB — T4, FREE: Free T4: 1.19 ng/dL (ref 0.82–1.77)

## 2019-11-17 LAB — TSH: TSH: 0.255 u[IU]/mL — ABNORMAL LOW (ref 0.450–4.500)

## 2019-11-17 LAB — HEMOGLOBIN A1C
Est. average glucose Bld gHb Est-mCnc: 174 mg/dL
Hgb A1c MFr Bld: 7.7 % — ABNORMAL HIGH (ref 4.8–5.6)

## 2019-11-26 NOTE — Progress Notes (Signed)
CMA called the [employer] to have an interpreter present for the lab results call. Currently waiting for the interpreter to call back and will call patient once we get a [place] interpreter.  

## 2019-11-29 DIAGNOSIS — Z419 Encounter for procedure for purposes other than remedying health state, unspecified: Secondary | ICD-10-CM | POA: Diagnosis not present

## 2019-12-03 ENCOUNTER — Ambulatory Visit (INDEPENDENT_AMBULATORY_CARE_PROVIDER_SITE_OTHER): Payer: Medicaid Other | Admitting: Internal Medicine

## 2019-12-03 ENCOUNTER — Other Ambulatory Visit: Payer: Self-pay

## 2019-12-03 ENCOUNTER — Encounter: Payer: Self-pay | Admitting: Internal Medicine

## 2019-12-03 ENCOUNTER — Other Ambulatory Visit: Payer: Self-pay | Admitting: Internal Medicine

## 2019-12-03 VITALS — BP 126/84 | HR 79 | Ht 61.0 in | Wt 137.4 lb

## 2019-12-03 DIAGNOSIS — E0590 Thyrotoxicosis, unspecified without thyrotoxic crisis or storm: Secondary | ICD-10-CM

## 2019-12-03 MED ORDER — METHIMAZOLE 5 MG PO TABS: 5.0000 mg | ORAL_TABLET | Freq: Every day | ORAL | 1 refills | Status: DC

## 2019-12-03 NOTE — Patient Instructions (Signed)
Start Methimazole 5 mg , 1 tablet daily  

## 2019-12-03 NOTE — Progress Notes (Signed)
Name: Veronica Love  MRN/ DOB: [id number], [birth year]    Age/ Sex: 34 y.o., female     PCP: Gildardo Pounds, NP   Reason for Endocrinology Evaluation: Low TSH      Initial Endocrinology Clinic Visit: 03/03/2018    PATIENT IDENTIFIER: Veronica Love is a 34 y.o., female with a past medical history of  Dyslipidemia and T2DM.Veronica Love She has followed with [facility] since 03/03/2018 for consultative assistance with management of her low TSH    HISTORICAL SUMMARY:  Pt was noted to have low TSH at 0.232 uIU/mL during routine lab workup in 08/2017. Repeat labs in 11/2017 confirmed similar results.  Thyroid ultrasound in 02/2018 showed no evidence of thyroid nodules.   An order for thyroid uptake and scan was order in 05/2019 but this has not been done as the pt did not know where to go   SUBJECTIVE:   Today (12/03/2019):  [name] is here for 6 month follow up on subclinical hyperthyroidism. She is accompanied by an interpreter today   Her weight has been stable  She denies palpitations, or diarrhea  Denies local neck symptoms   She is on Nexplanon     HISTORY:  Past Medical History:  Past Medical History:  Diagnosis Date  . Diabetes mellitus type 2, uncontrolled, with complications ([place])   . Gestational diabetes 2013  . 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 No pertinent past medical history    Past Surgical History:  Past Surgical History:  Procedure Laterality Date  . NO PAST SURGERIES      Social History:  reports that she has never smoked. She has never used smokeless tobacco. She reports that she does not drink alcohol and does not use drugs. Family History:  Family History  Problem Relation Age of Onset  . Diabetes Father      HOME MEDICATIONS: Allergies as of 12/03/2019      Reactions   Metformin And Related    Can not take 1000 mg of metformin as it causes nausea and headaches.      Medication List       Accurate as of December 03, 2019 12:40 PM. If you have any questions, ask your nurse or doctor.        Accu-Chek FastClix Lancets Misc Use as instructed. Check blood glucose level by fingerstick twice per day.  E11.65   Accu-Chek Guide test strip Generic drug: glucose blood Use as instructed. Check blood glucose level by fingerstick twice per day.  E11.65   Accu-Chek Guide w/Device Kit 1 each by Does not apply route daily.   atorvastatin 20 MG tablet Commonly known as: LIPITOR Take 1 tablet (20 mg total) by mouth daily at 6 PM.   cetirizine 10 MG tablet Commonly known as: ZYRTEC Take 1 tablet (10 mg total) by mouth daily.   empagliflozin 25 MG Tabs tablet Commonly known as: Jardiance Take 1 tablet (25 mg total) by mouth daily.   etonogestrel 68 MG Impl implant Commonly known as: Nexplanon Inject 1 each (68 mg total) into the skin once.   lisinopril 5 MG tablet Commonly known as: ZESTRIL Take 1 tablet (5 mg total) by mouth daily.   metFORMIN 500 MG tablet Commonly known as: GLUCOPHAGE Take 1 tablet (500 mg total) by mouth 2 (two) times daily with a meal.   omeprazole 20 MG capsule Commonly known as: PRILOSEC Take 1 capsule (20 mg total)  by mouth 2 (two) times daily before a meal for 7 days.         OBJECTIVE:   PHYSICAL EXAM: VS: BP 126/84   Pulse 79   Ht '5\' 1"'  (1.549 m)   Wt 137 lb 6 oz (62.3 kg)   SpO2 98%   BMI 25.96 kg/m    EXAM: General: Pt appears well and is in NAD  Neck: General: Supple without adenopathy. Thyroid: Thyoid asymmetry noted R> L lobe.  No goiter or nodules appreciated. No thyroid bruit.  Lungs: Clear with good BS bilat with no rales, rhonchi, or wheezes  Heart: Auscultation: RRR.  Abdomen: Normoactive bowel sounds, soft, nontender, without masses or organomegaly palpable  Extremities:  BL LE: No pretibial edema normal ROM and strength.  Mental Status: Judgment, insight: Intact Orientation: Oriented to time, place, and person Mood and affect: No  depression, anxiety, or agitation     DATA REVIEWED:  Results for Love, Veronica (MRN [id number]) as of 12/06/2019 12:28  Ref. Range 12/03/2019 15:23  TSH Latest Ref Range: 0.35 - 4.50 uIU/mL 0.27 (L)  T4,Free(Direct) Latest Ref Range: 0.60 - 1.60 ng/dL 0.91   Results for Love, Veronica (MRN [id number]) as of 06/02/2018 07:55  Ref. Range 03/03/2018 09:10  TRAB Latest Ref Range: <=2.00 IU/L 1.06    Thyroid Ultrasound 03/17/2018 There are no discrete nodules which meet criteria for biopsy nor follow-up. The right lobe is larger than the left.    ASSESSMENT / PLAN / RECOMMENDATIONS:   1. Subclinical hyperthyroidism :  - She continues to be clinically euthyroid  - TFT's have been stable  - We attempted to proceed with thyroid uptake ans scan in 05/2019 but she did not do it stating she didn't know where to go. - We have opted to treat with small dose of methimazole as this has been going on for a long time without improvement.     - We discussed with pt the benefits of methimazole in the Tx of hyperthyroidism, as well as the possible side effects/complications of anti-thyroid drug Tx (specifically detailing the rare, but serious side effect of agranulocytosis). She was informed of need for regular thyroid function monitoring while on methimazole to ensure appropriate dosage without over-treatment. As well, we discussed the possible side effects of methimazole including the chance of rash, the small chance of liver irritation/juandice and the <=1 in 300-400 chance of sudden onset agranulocytosis.  We discussed importance of going to ED promptly (and stopping methimazole) if shewere to develop significant fever with severe sore throat of other evidence of acute infection.       Medication  Start methimazole 5 mg daily      F/u in 3 months.   Addendum: Discussed worsening TSH through interpreter line on 12/06/2019 at 12:30 pm    Signed electronically by: Mack Guise,  MD  [facility]  [facility] [address] [place], [place] [address] Phone: [phone] FAX: [phone]      CC: Gildardo Pounds, NP [place] [address] Phone: [phone]  Fax: [phone]   Return to Endocrinology clinic as below: Future Appointments  Date Time Provider [place]  12/03/2019  2:40 PM Dain Laseter, Melanie Crazier, MD [id number] None  01/03/2020 10:30 AM Thornton Park, MD [facility]  01/05/2020  8:30 AM Gildardo Pounds, NP [facility] None

## 2019-12-06 LAB — TSH: TSH: 0.27 u[IU]/mL — ABNORMAL LOW (ref 0.35–4.50)

## 2019-12-06 LAB — T4, FREE: Free T4: 0.91 ng/dL (ref 0.60–1.60)

## 2019-12-06 MED FILL — methIMAzole 5 MG TABS: 5 | 90 days supply | Qty: 90 | Fill #0

## 2019-12-10 MED FILL — METFORMIN HCL 500 MG TABS: 500 | 90 days supply | Qty: 180 | Fill #0

## 2019-12-29 DIAGNOSIS — Z419 Encounter for procedure for purposes other than remedying health state, unspecified: Secondary | ICD-10-CM | POA: Diagnosis not present

## 2020-01-03 ENCOUNTER — Other Ambulatory Visit: Payer: Medicaid Other

## 2020-01-03 ENCOUNTER — Encounter: Payer: Self-pay | Admitting: Gastroenterology

## 2020-01-03 ENCOUNTER — Ambulatory Visit (INDEPENDENT_AMBULATORY_CARE_PROVIDER_SITE_OTHER): Payer: Medicaid Other | Admitting: Gastroenterology

## 2020-01-03 VITALS — BP 96/64 | HR 96 | Ht 59.75 in | Wt 136.2 lb

## 2020-01-03 DIAGNOSIS — A048 Other specified bacterial intestinal infections: Secondary | ICD-10-CM | POA: Diagnosis not present

## 2020-01-03 DIAGNOSIS — R109 Unspecified abdominal pain: Secondary | ICD-10-CM

## 2020-01-03 DIAGNOSIS — K8020 Calculus of gallbladder without cholecystitis without obstruction: Secondary | ICD-10-CM | POA: Diagnosis not present

## 2020-01-03 DIAGNOSIS — R933 Abnormal findings on diagnostic imaging of other parts of digestive tract: Secondary | ICD-10-CM | POA: Diagnosis not present

## 2020-01-03 NOTE — Patient Instructions (Addendum)
I have recommended a stool test to see if your infection (H pylori) was successfully treated.  LABS: Your provider has requested that you go to the basement level for lab work before leaving today. Press "B" on the elevator. The lab is located at the first door on the left as you exit the elevator.  HEALTHCARE LAWS AND MY CHART RESULTS: Due to recent changes in healthcare laws, you may see the results of your imaging and laboratory studies on MyChart before your provider has had a chance to review them.  We understand that in some cases there may be results that are confusing or concerning to you. Not all laboratory results come back in the same time frame and the provider may be waiting for multiple results in order to interpret others.  Please give [place] 48 hours in order for your provider to thoroughly review all the results before contacting the office for clarification of your results.   You should have another CT scan to follow-up on the cysts in your liver.   I'd like to see you after the CT scan to review the results. Please call me with any questions or concerns prior to that time.   YOU WILL NEED LABS CLOSER TO THE DATE OF YOUR CT SCAN. WE WILL CALL YOU TO REMIND YOU ABOUT THE DATE YOU SHOULD HAVE YOUR LABS COMPLETED.  You have been scheduled for a CT scan of the abdomen and pelvis at [facility], 1st floor Radiology. You are scheduled on 07/03/20  at 12:30pm. You should arrive 15 minutes prior to your appointment time for registration.  Please pick up 2 bottles of contrast from [place] at least 3 days prior to your scan. The solution may taste better if refrigerated, but do NOT add ice or any other liquid to this solution. Shake well before drinking.   Please follow the written instructions below on the day of your exam:   1) Do not eat anything after 8:30am (4 hours prior to your test)   2) Drink 1 bottle of contrast @ 10:30am (2 hours prior to your exam)  Remember to shake well  before drinking and do NOT pour over ice.     Drink 1 bottle of contrast @ 11:30am (1 hour prior to your exam)   You may take any medications as prescribed with a small amount of water, if necessary. If you take any of the following medications: METFORMIN, GLUCOPHAGE, GLUCOVANCE, AVANDAMET, RIOMET, FORTAMET, [place] MET, JANUMET, GLUMETZA or METAGLIP, you MAY be asked to HOLD this medication 48 hours AFTER the exam.   The purpose of you drinking the oral contrast is to aid in the visualization of your intestinal tract. The contrast solution may cause some diarrhea. Depending on your individual set of symptoms, you may also receive an intravenous injection of x-ray contrast/dye. Plan on being at [facility] for 45 minutes or longer, depending on the type of exam you are having performed.   If you have any questions regarding your exam or if you need to reschedule, you may call [facility] at [phone] between the hours of 8:00 am and 5:00 pm, Monday-Friday.   If you are age 63 or younger, your body mass index should be between 19-25. Your There is no height or weight on file to calculate BMI. If this is out of the aformentioned range listed, please consider follow up with your Primary Care Provider.   Thank you for trusting me with your gastrointestinal  care!    Thornton Park, MD, MPH

## 2020-01-03 NOTE — Progress Notes (Signed)
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 pylori may be the source of her abdominal pain as her symptoms were resolved with antibiotic therapy.  Has been off PPI for several weeks. Will plan testing to document resolution.   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  Likely a benign process that is not contributing to her recent symptoms.  Repeat CT in 6 as recommended by the radiologist.  Cholelithiasis: Reviewed management principles today.  Could consider a trial of ursodiol if we felt her gallstones were symptomatic as well as referral to surgery.  PLAN: H pylori stool antigen   CT abd/pelvis in 6 months to monitor the splenic cysts Follow-up after the CT scan, earlier with new symptoms Have asked her to contact me with the recurrence of any abdominal pain  All questions were answered to the patient's satisfaction.    HPI: Veronica Love is a 34 y.o. female who returns in follow-up. She was initially seen as a virtual visit for gallstones 07/14/18.  The interval history is obtained through the patient and review of her electronic health record. [facility] interpreter assisted.  She is a history of type 2 diabetes and hyperlipidemia.  She has a history of chronic abdominal pain documented in prior office notes in EPIC.  An abdominal ultrasound 03/26/2018 to evaluate chronic abdominal pain since 2017 showed gallstones and a stable splenic cyst but was otherwise normal.  At time of my virtual consultation 07/14/18 she had no abdominal pain.   Repeat ultrasound obtained 07/19/19 when she reported worsening abdominal pain to Dr. Raul Del.   Abdominal ultrasound  07/19/19: The previously noted gallstone is not identified today. The gallbladder appears within normal limits. Slight increased size of hypoechoic splenic lesions. Consider further characterization with MRI.  Subsequent CT abd/pelvis 08/28/19: Foci of capsular retraction noted in the spleen with scattered areas of low signal intensity on T1 and T2 imaging. Patient was noted to have multiple discrete hypoenhancing lesions in the splenic parenchyma on CT of 07/06/2015. There has been some capsular retraction in the interval since that exam, but no substantial progression of the remaining splenic lesions. Overall, imaging features suggest a benign etiology, and while MR characteristics are nonspecific, infectious etiology would be a consideration. Follow-up could be used to ensure stability. Repeat MRI in 6-12 months could be considered.  H pylori breath test positive 09/06/19. She was treated with antibiotics and felt there was some improvement.   No significant ongoing abdominal pain or GI symptoms at this time.   She asks about her gallstones.   Labs 11/16/19 showed a normal comprehensive metabolic panel except for a glucose of 131.  Liver enzymes were normal.  Total protein 8.2.  White count 7.8, hemoglobin 13.4, MCV 76, RDW 14.5, platelets 252.  Hemoglobin A1c 7.7.  Past Medical History:  Diagnosis Date  . Diabetes mellitus type 2, uncontrolled, with complications ([place])   . Gestational diabetes 2013  . 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 No pertinent past medical history     Past Surgical History:  Procedure Laterality Date  . NO PAST SURGERIES      Current Outpatient Medications  Medication Sig Dispense Refill  . Accu-Chek FastClix Lancets MISC Use as instructed. Check blood glucose level by fingerstick twice per day.  E11.65 102 each 12  . atorvastatin (LIPITOR) 20 MG tablet Take 1 tablet (20 mg total) by mouth daily at 6 PM. 90 tablet 3  . Blood Glucose  Monitoring Suppl (ACCU-CHEK GUIDE) w/Device KIT 1 each by Does not apply route daily. 1 kit 0  . cetirizine (ZYRTEC) 10 MG tablet Take 1 tablet (10 mg total) by mouth daily. 90 tablet 3  . empagliflozin (JARDIANCE) 25 MG TABS tablet Take 1 tablet (25 mg total) by mouth daily. 90 tablet 1  . etonogestrel (NEXPLANON) 68 MG IMPL implant Inject 1 each (68 mg total) into the skin once. 1 each 0  . glucose blood (ACCU-CHEK GUIDE) test strip Use as instructed. Check blood glucose level by fingerstick twice per day.  E11.65 100 each 12  . lisinopril (ZESTRIL) 5 MG tablet Take 1 tablet (5 mg total) by mouth daily. 90 tablet 3  . metFORMIN (GLUCOPHAGE) 500 MG tablet Take 1 tablet (500 mg total) by mouth 2 (two) times daily with a meal. 180 tablet 1  . methimazole (TAPAZOLE) 5 MG tablet Take 1 tablet (5 mg total) by mouth daily. 90 tablet 1  . omeprazole (PRILOSEC) 20 MG capsule Take 1 capsule (20 mg total) by mouth 2 (two) times daily before a meal for 7 days. 14 capsule 0   No current facility-administered medications for this visit.    Allergies as of 01/03/2020 - Review Complete 01/03/2020  Allergen Reaction Noted  . Metformin and related  10/05/2019    Family History  Problem Relation Age of Onset  . Diabetes Father     Social History   Socioeconomic History  . Marital status: Married    Spouse name: Not on file  . Number of children: Not on file  . Years of education: Not on file  . Highest education level: Not on file  Occupational History  . Not on file  Tobacco Use  . Smoking status: Never Smoker  . Smokeless tobacco: Never Used  Vaping Use  . Vaping Use: Never used  Substance and Sexual Activity  . Alcohol use: No  . Drug use: No  . Sexual activity: Yes    Birth control/protection: Implant  Other Topics Concern  . Not on file  Social History Narrative  . Not on file   Social Determinants of Health   Financial Resource Strain:   . Difficulty of Paying Living Expenses:  Not on file  Food Insecurity:   . Worried About Charity fundraiser in the Last Year: Not on file  . Ran Out of Food in the Last Year: Not on file  Transportation Needs:   . Lack of Transportation (Medical): Not on file  . Lack of Transportation (Non-Medical): Not on file  Physical Activity:   . Days of Exercise per Week: Not on file  . Minutes of Exercise per Session: Not on file  Stress:   . Feeling of Stress : Not on file  Social Connections:   . Frequency of Communication with Friends and Family: Not on file  . Frequency of Social Gatherings with Friends and Family: Not on file  . Attends Religious Services: Not on file  . Active Member of Clubs or Organizations: Not on file  . Attends Archivist Meetings: Not on file  . Marital Status: Not on file  Intimate Partner Violence:   .  Fear of Current or Ex-Partner: Not on file  . Emotionally Abused: Not on file  . Physically Abused: Not on file  . Sexually Abused: Not on file    Review of Systems: ALL ROS discussed and all others negative except listed in HPI.  Physical Exam: Complete physical exam not performed due to the limits inherent in a telehealth encounter.  General: Awake, alert, and oriented, and well communicative. In no acute distress.  HEENT: EOMI, non-icteric sclera, NCAT, MMM  Neck: Normal movement of head and neck  Pulm: No labored breathing, speaking in full sentences without conversational dyspnea  Derm: No apparent lesions or bruising in visible field  MS: Moves all visible extremities without noticeable abnormality  Psych: Pleasant, cooperative, normal speech, normal affect and normal insight Neuro: Alert and appropriate   Shalane Florendo L. Tarri Glenn, MD, MPH [facility] 01/03/2020, 10:41 AM

## 2020-01-04 ENCOUNTER — Other Ambulatory Visit: Payer: Medicaid Other

## 2020-01-04 DIAGNOSIS — R933 Abnormal findings on diagnostic imaging of other parts of digestive tract: Secondary | ICD-10-CM | POA: Diagnosis not present

## 2020-01-04 DIAGNOSIS — R109 Unspecified abdominal pain: Secondary | ICD-10-CM | POA: Diagnosis not present

## 2020-01-04 DIAGNOSIS — A048 Other specified bacterial intestinal infections: Secondary | ICD-10-CM | POA: Diagnosis not present

## 2020-01-04 DIAGNOSIS — K8020 Calculus of gallbladder without cholecystitis without obstruction: Secondary | ICD-10-CM | POA: Diagnosis not present

## 2020-01-05 ENCOUNTER — Other Ambulatory Visit: Payer: Self-pay | Admitting: Nurse Practitioner

## 2020-01-05 ENCOUNTER — Ambulatory Visit: Payer: Medicaid Other | Attending: Nurse Practitioner | Admitting: Nurse Practitioner

## 2020-01-05 ENCOUNTER — Encounter: Payer: Self-pay | Admitting: Nurse Practitioner

## 2020-01-05 ENCOUNTER — Other Ambulatory Visit: Payer: Self-pay

## 2020-01-05 VITALS — BP 103/67 | HR 70 | Temp 99.5°F | Ht 61.0 in | Wt 138.0 lb

## 2020-01-05 DIAGNOSIS — E1165 Type 2 diabetes mellitus with hyperglycemia: Secondary | ICD-10-CM

## 2020-01-05 DIAGNOSIS — E785 Hyperlipidemia, unspecified: Secondary | ICD-10-CM

## 2020-01-05 DIAGNOSIS — K219 Gastro-esophageal reflux disease without esophagitis: Secondary | ICD-10-CM | POA: Diagnosis not present

## 2020-01-05 LAB — GLUCOSE, POCT (MANUAL RESULT ENTRY): POC Glucose: 121 mg/dl — AB (ref 70–99)

## 2020-01-05 LAB — HELICOBACTER PYLORI  SPECIAL ANTIGEN
MICRO NUMBER:: 11286720
SPECIMEN QUALITY: ADEQUATE

## 2020-01-05 MED ORDER — LISINOPRIL 5 MG PO TABS: 5.0000 mg | ORAL_TABLET | Freq: Every day | ORAL | 3 refills | Status: DC

## 2020-01-05 MED ORDER — ACCU-CHEK GUIDE VI STRP: ORAL_STRIP | 12 refills | Status: DC

## 2020-01-05 MED ORDER — ACCU-CHEK FASTCLIX LANCETS MISC: 12 refills | Status: DC

## 2020-01-05 MED ORDER — OMEPRAZOLE 20 MG PO CPDR: 20.0000 mg | DELAYED_RELEASE_CAPSULE | Freq: Two times a day (BID) | ORAL | 0 refills | Status: DC

## 2020-01-05 MED ORDER — METFORMIN HCL 500 MG PO TABS: 500.0000 mg | ORAL_TABLET | Freq: Two times a day (BID) | ORAL | 1 refills | Status: DC

## 2020-01-05 MED ORDER — ATORVASTATIN CALCIUM 20 MG PO TABS: 20.0000 mg | ORAL_TABLET | Freq: Every day | ORAL | 3 refills | Status: DC

## 2020-01-05 MED ORDER — LISINOPRIL 2.5 MG PO TABS: 2.5000 mg | ORAL_TABLET | Freq: Every day | ORAL | 1 refills | Status: DC

## 2020-01-05 MED FILL — ACCU-CHEK GUIDE TEST STRIP: 50 days supply | Qty: 100 | Fill #0

## 2020-01-05 MED FILL — ACCU-CHEK SOFTCLIX LANCETS: 50 days supply | Qty: 100 | Fill #1

## 2020-01-05 MED FILL — LISINOPRIL 2.5 MG TABLET: 2.5 | 90 days supply | Qty: 90 | Fill #0

## 2020-01-05 MED FILL — OMEPRAZOLE 20 MG CAP: 20 | 7 days supply | Qty: 14 | Fill #0

## 2020-01-05 NOTE — Progress Notes (Signed)
Assessment & Plan:  Veronica Love was seen today for follow-up.  Diagnoses and all orders for this visit:  Type 2 diabetes mellitus with hyperglycemia, without long-term current use of insulin (HCC) -     Glucose (CBG) -     metFORMIN (GLUCOPHAGE) 500 MG tablet; Take 1 tablet (500 mg total) by mouth 2 (two) times daily with a meal. -     glucose blood (ACCU-CHEK GUIDE) test strip; Use as instructed. Check blood glucose level by fingerstick twice per day.  E11.65 -     Accu-Chek FastClix Lancets MISC; Use as instructed. Check blood glucose level by fingerstick twice per day.  E11.65 -     lisinopril (ZESTRIL) 2.5 MG tablet; Take 1 tablet (2.5 mg total) by mouth daily. Continue blood sugar control as discussed in office today, low carbohydrate diet, and regular physical exercise as tolerated, 150 minutes per week (30 min each day, 5 days per week, or 50 min 3 days per week). Keep blood sugar logs with fasting goal of 90-130 mg/dl, post prandial (after you eat) less than 180.  For Hypoglycemia: BS <60 and Hyperglycemia BS >400; contact the clinic ASAP. Annual eye exams and foot exams are recommended.   Dyslipidemia, goal LDL below 70 -     atorvastatin (LIPITOR) 20 MG tablet; Take 1 tablet (20 mg total) by mouth daily at 6 PM. INSTRUCTIONS: Work on a low fat, heart healthy diet and participate in regular aerobic exercise program by working out at least 150 minutes per week; 5 days a week-30 minutes per day. Avoid red meat/beef/steak,  fried foods. junk foods, sodas, sugary drinks, unhealthy snacking, alcohol and smoking.  Drink at least 80 oz of water per day and monitor your carbohydrate intake daily.   GERD without esophagitis -     omeprazole (PRILOSEC) 20 MG capsule; Take 1 capsule (20 mg total) by mouth 2 (two) times daily before a meal for 7 days. INSTRUCTIONS: Avoid GERD Triggers: acidic, spicy or fried foods, caffeine, coffee, sodas,  alcohol and chocolate.     Patient has been counseled on  age-appropriate routine health concerns for screening and prevention. These are reviewed and up-to-date. Referrals have been placed accordingly. Immunizations are up-to-date or declined.    Subjective:   Chief Complaint  Patient presents with  . Follow-up    Pt. is here for 3 months F.U.    HPI Veronica Love 34 y.o. female presents to office today for follow up.  She has a PMH of DM2 and subclinical hyperthyroidism for which she is seeing Endocrinology. She is accompanied by a [place] onsite translator today. Has concerns regarding her splenic lesion and what types of food to avoid.  I have instructed her in regard to overall health to try to eat as clean as possible.  Eating more fruits and vegetables, less meats, carbs and fatty foods.   DM2 Monitoring blood glucose level was at home twice per day.  She has her meter for review today with average as follows: 7 day 141 14 day 145 21 day 145 90 day 150 She is currently taking Metformin 500 mg twice daily.  Unable to tolerate any doses higher than this due to GI upset.  She is on statin and renal dose ACE.  LDL is not quite at goal.  Blood pressure is well controlled. I instructed her today that her goal for her A1c is 6.5 or less and we discussed dietary modifications in detail today Lab Results  Component Value Date   HGBA1C 7.7 (H) 11/16/2019   Lab Results  Component Value Date   LDLCALC 94 10/05/2019   BP Readings from Last 3 Encounters:  01/05/20 103/67  01/03/20 96/64  12/03/19 126/84     Review of Systems  Constitutional: Negative for fever, malaise/fatigue and weight loss.  HENT: Negative.  Negative for nosebleeds.   Eyes: Negative.  Negative for blurred vision, double vision and photophobia.  Respiratory: Negative.  Negative for cough and shortness of breath.   Cardiovascular: Negative.  Negative for chest pain, palpitations and leg swelling.  Gastrointestinal: Negative.  Negative for heartburn, nausea and vomiting.   Musculoskeletal: Negative.  Negative for myalgias.  Neurological: Negative.  Negative for dizziness, focal weakness, seizures and headaches.  Psychiatric/Behavioral: Negative.  Negative for suicidal ideas.    Past Medical History:  Diagnosis Date  . Diabetes mellitus type 2, uncontrolled, with complications ([place])   . Gestational diabetes 2013  . 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 No pertinent past medical history     Past Surgical History:  Procedure Laterality Date  . NO PAST SURGERIES      Family History  Problem Relation Age of Onset  . Diabetes Father     Social History Reviewed with no changes to be made today.   Outpatient Medications Prior to Visit  Medication Sig Dispense Refill  . Blood Glucose Monitoring Suppl (ACCU-CHEK GUIDE) w/Device KIT 1 each by Does not apply route daily. 1 kit 0  . cetirizine (ZYRTEC) 10 MG tablet Take 1 tablet (10 mg total) by mouth daily. 90 tablet 3  . etonogestrel (NEXPLANON) 68 MG IMPL implant Inject 1 each (68 mg total) into the skin once. 1 each 0  . methimazole (TAPAZOLE) 5 MG tablet Take 1 tablet (5 mg total) by mouth daily. 90 tablet 1  . Accu-Chek FastClix Lancets MISC Use as instructed. Check blood glucose level by fingerstick twice per day.  E11.65 102 each 12  . atorvastatin (LIPITOR) 20 MG tablet Take 1 tablet (20 mg total) by mouth daily at 6 PM. 90 tablet 3  . glucose blood (ACCU-CHEK GUIDE) test strip Use as instructed. Check blood glucose level by fingerstick twice per day.  E11.65 100 each 12  . lisinopril (ZESTRIL) 5 MG tablet Take 1 tablet (5 mg total) by mouth daily. 90 tablet 3  . metFORMIN (GLUCOPHAGE) 500 MG tablet Take 1 tablet (500 mg total) by mouth 2 (two) times daily with a meal. 180 tablet 1  . omeprazole (PRILOSEC) 20 MG capsule Take 1 capsule (20 mg total) by mouth 2 (two) times daily before a meal for 7 days. 14 capsule 0   No facility-administered medications prior to visit.     Allergies  Allergen Reactions  . Metformin And Related     Can not take 1000 mg of metformin as it causes nausea and headaches.       Objective:    BP 103/67 (BP Location: Right Arm, Patient Position: Sitting, Cuff Size: Normal)   Pulse 70   Temp 99.5 F (37.5 C) (Temporal)   Ht '5\' 1"'  (1.549 m)   Wt 138 lb (62.6 kg)   LMP 12/26/2019   SpO2 97%   BMI 26.07 kg/m  Wt Readings from Last 3 Encounters:  01/05/20 138 lb (62.6 kg)  01/03/20 136 lb 4 oz (61.8 kg)  12/03/19 137 lb 6 oz (62.3 kg)    Physical Exam Vitals and nursing note  reviewed.  Constitutional:      Appearance: She is well-developed.  HENT:     Head: Normocephalic and atraumatic.  Cardiovascular:     Rate and Rhythm: Normal rate and regular rhythm.     Heart sounds: Normal heart sounds. No murmur heard.  No friction rub. No gallop.   Pulmonary:     Effort: Pulmonary effort is normal. No tachypnea or respiratory distress.     Breath sounds: Normal breath sounds. No decreased breath sounds, wheezing, rhonchi or rales.  Chest:     Chest wall: No tenderness.  Abdominal:     General: Bowel sounds are normal.     Palpations: Abdomen is soft.  Musculoskeletal:        General: Normal range of motion.     Cervical back: Normal range of motion.  Skin:    General: Skin is warm and dry.  Neurological:     Mental Status: She is alert and oriented to person, place, and time.     Coordination: Coordination normal.  Psychiatric:        Behavior: Behavior normal. Behavior is cooperative.        Thought Content: Thought content normal.        Judgment: Judgment normal.          Patient has been counseled extensively about nutrition and exercise as well as the importance of adherence with medications and regular follow-up. The patient was given clear instructions to go to ER or return to medical center if symptoms don't improve, worsen or new problems develop. The patient verbalized understanding.    Follow-up: Return in about 8 weeks (around 03/01/2020).   Gildardo Pounds, FNP-BC [facility] and [facility] [place], [place]   01/05/2020, 9:13 AM

## 2020-01-29 DIAGNOSIS — Z419 Encounter for procedure for purposes other than remedying health state, unspecified: Secondary | ICD-10-CM | POA: Diagnosis not present

## 2020-02-01 ENCOUNTER — Other Ambulatory Visit: Payer: Self-pay | Admitting: Pharmacist

## 2020-02-01 MED ORDER — EMPAGLIFLOZIN 10 MG PO TABS: 10.0000 mg | ORAL_TABLET | Freq: Every day | ORAL | 1 refills | Status: DC

## 2020-02-01 MED FILL — JARDIANCE 10 MG TABLET: 10 | 90 days supply | Qty: 90 | Fill #0

## 2020-02-28 ENCOUNTER — Encounter: Payer: Self-pay | Admitting: Nurse Practitioner

## 2020-02-28 ENCOUNTER — Other Ambulatory Visit: Payer: Self-pay

## 2020-02-28 ENCOUNTER — Other Ambulatory Visit: Payer: Self-pay | Admitting: Nurse Practitioner

## 2020-02-28 ENCOUNTER — Ambulatory Visit: Payer: Medicaid Other | Attending: Nurse Practitioner | Admitting: Nurse Practitioner

## 2020-02-28 VITALS — BP 115/74 | HR 76 | Temp 98.7°F | Ht 61.0 in | Wt 137.8 lb

## 2020-02-28 DIAGNOSIS — E0590 Thyrotoxicosis, unspecified without thyrotoxic crisis or storm: Secondary | ICD-10-CM

## 2020-02-28 DIAGNOSIS — E1165 Type 2 diabetes mellitus with hyperglycemia: Secondary | ICD-10-CM | POA: Diagnosis not present

## 2020-02-28 LAB — GLUCOSE, POCT (MANUAL RESULT ENTRY): POC Glucose: 144 mg/dl — AB (ref 70–99)

## 2020-02-28 LAB — POCT GLYCOSYLATED HEMOGLOBIN (HGB A1C): Hemoglobin A1C: 8.2 % — AB (ref 4.0–5.6)

## 2020-02-28 MED ORDER — EMPAGLIFLOZIN 25 MG PO TABS: 25.0000 mg | ORAL_TABLET | Freq: Every day | ORAL | 0 refills | Status: DC

## 2020-02-28 MED FILL — JARDIANCE 25 MG TABLET: 25 | 90 days supply | Qty: 90 | Fill #0

## 2020-02-28 MED FILL — LISINOPRIL 2.5 MG TABLET: 2.5 | 90 days supply | Qty: 90 | Fill #0

## 2020-02-28 MED FILL — ATORVASTATIN CALCIUM 20 MG: 20 | 90 days supply | Qty: 90 | Fill #0

## 2020-02-28 NOTE — Progress Notes (Signed)
Assessment & Plan:  Veronica Love was seen today for follow-up.  Diagnoses and all orders for this visit:  Type 2 diabetes mellitus with hyperglycemia, without long-term current use of insulin (HCC) -     Glucose (CBG) -     HgB A1c -     Cancel: Microalbumin/Creatinine Ratio, Urine -     Microalbumin/Creatinine Ratio, Urine; Future -     empagliflozin (JARDIANCE) 25 MG TABS tablet; Take 1 tablet (25 mg total) by mouth daily before breakfast. -     CMP14+EGFR Continue blood sugar control as discussed in office today, low carbohydrate diet, and regular physical exercise as tolerated, 150 minutes per week (30 min each day, 5 days per week, or 50 min 3 days per week). Keep blood sugar logs with fasting goal of 90-130 mg/dl, post prandial (after you eat) less than 180.  For Hypoglycemia: BS <60 and Hyperglycemia BS >400; contact the clinic ASAP. Annual eye exams and foot exams are recommended.   Subclinical hyperthyroidism Follow up with Endo next month  Patient has been counseled on age-appropriate routine health concerns for screening and prevention. These are reviewed and up-to-date. Referrals have been placed accordingly. Immunizations are up-to-date or declined.    Subjective:   Chief Complaint  Patient presents with  . Follow-up    Patient is here for diabetes follow up.    HPI Veronica Love 35 y.o. female presents to office today for follow up. She is accompanied by an onsite interpreter who was used to communicate directly with patient for the entire encounter including providing detailed patient instructions.   DM 2 Poorly controlled. She endorses medication adherence taking metformin 500 mg BID. She is unable to tolerate any doses higher than this due to side effects of nausea and headaches. Will increase jardiance from 10 mg to 25 mg daily. We had a discussion today regarding carbohydrate intake (rice) and dietary modifications. She denies any symptoms of hypo or hyperglycemia. LDL not  at goal with atorvastatin 20 mg daily. She is taking renal dose ACE as well.  Lab Results  Component Value Date   HGBA1C 8.2 (A) 02/28/2020   Lab Results  Component Value Date   LDLCALC 94 10/05/2019   BP Readings from Last 3 Encounters:  02/28/20 115/74  01/05/20 103/67  01/03/20 96/64    Review of Systems  Constitutional: Negative for fever, malaise/fatigue and weight loss.  HENT: Negative.  Negative for nosebleeds.   Eyes: Negative.  Negative for blurred vision, double vision and photophobia.  Respiratory: Negative.  Negative for cough and shortness of breath.   Cardiovascular: Negative.  Negative for chest pain, palpitations and leg swelling.  Gastrointestinal: Negative.  Negative for heartburn, nausea and vomiting.  Musculoskeletal: Negative.  Negative for myalgias.  Neurological: Negative.  Negative for dizziness, focal weakness, seizures and headaches.  Psychiatric/Behavioral: Negative.  Negative for suicidal ideas.    Past Medical History:  Diagnosis Date  . Diabetes mellitus type 2, uncontrolled, with complications ([place])   . Gestational diabetes 2013  . 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 No pertinent past medical history     Past Surgical History:  Procedure Laterality Date  . NO PAST SURGERIES      Family History  Problem Relation Age of Onset  . Diabetes Father     Social History Reviewed with no changes to be made today.   Outpatient Medications Prior to Visit  Medication Sig Dispense Refill  .  Accu-Chek FastClix Lancets MISC Use as instructed. Check blood glucose level by fingerstick twice per day.  E11.65 102 each 12  . atorvastatin (LIPITOR) 20 MG tablet Take 1 tablet (20 mg total) by mouth daily at 6 PM. 90 tablet 3  . Blood Glucose Monitoring Suppl (ACCU-CHEK GUIDE) w/Device KIT 1 each by Does not apply route daily. 1 kit 0  . cetirizine (ZYRTEC) 10 MG tablet Take 1 tablet (10 mg total) by mouth daily. 90 tablet 3  .  etonogestrel (NEXPLANON) 68 MG IMPL implant Inject 1 each (68 mg total) into the skin once. 1 each 0  . glucose blood (ACCU-CHEK GUIDE) test strip Use as instructed. Check blood glucose level by fingerstick twice per day.  E11.65 100 each 12  . lisinopril (ZESTRIL) 2.5 MG tablet Take 1 tablet (2.5 mg total) by mouth daily. 90 tablet 1  . metFORMIN (GLUCOPHAGE) 500 MG tablet Take 1 tablet (500 mg total) by mouth 2 (two) times daily with a meal. 180 tablet 1  . methimazole (TAPAZOLE) 5 MG tablet Take 1 tablet (5 mg total) by mouth daily. 90 tablet 1  . empagliflozin (JARDIANCE) 10 MG TABS tablet Take 1 tablet (10 mg total) by mouth daily before breakfast. 90 tablet 1  . omeprazole (PRILOSEC) 20 MG capsule Take 1 capsule (20 mg total) by mouth 2 (two) times daily before a meal for 7 days. 14 capsule 0   No facility-administered medications prior to visit.    Allergies  Allergen Reactions  . Metformin And Related     Can not take 1000 mg of metformin as it causes nausea and headaches.       Objective:    BP 115/74 (BP Location: Right Arm, Patient Position: Sitting, Cuff Size: Normal)   Pulse 76   Temp 98.7 F (37.1 C) (Oral)   Ht $R'5\' 1"'oo$  (1.549 m)   Wt 137 lb 12.8 oz (62.5 kg)   SpO2 99%   BMI 26.04 kg/m  Wt Readings from Last 3 Encounters:  02/28/20 137 lb 12.8 oz (62.5 kg)  01/05/20 138 lb (62.6 kg)  01/03/20 136 lb 4 oz (61.8 kg)    Physical Exam Vitals and nursing note reviewed.  Constitutional:      Appearance: She is well-developed and well-nourished.  HENT:     Head: Normocephalic and atraumatic.  Eyes:     Extraocular Movements: EOM normal.  Cardiovascular:     Rate and Rhythm: Normal rate and regular rhythm.     Pulses: Intact distal pulses.     Heart sounds: Normal heart sounds. No murmur heard. No friction rub. No gallop.   Pulmonary:     Effort: Pulmonary effort is normal. No tachypnea or respiratory distress.     Breath sounds: Normal breath sounds. No  decreased breath sounds, wheezing, rhonchi or rales.  Chest:     Chest wall: No tenderness.  Abdominal:     General: Bowel sounds are normal.     Palpations: Abdomen is soft.  Musculoskeletal:        General: No edema. Normal range of motion.     Cervical back: Normal range of motion.  Skin:    General: Skin is warm and dry.  Neurological:     Mental Status: She is alert and oriented to person, place, and time.     Coordination: Coordination normal.  Psychiatric:        Mood and Affect: Mood and affect normal.        Behavior:  Behavior normal. Behavior is cooperative.        Thought Content: Thought content normal.        Judgment: Judgment normal.          Patient has been counseled extensively about nutrition and exercise as well as the importance of adherence with medications and regular follow-up. The patient was given clear instructions to go to ER or return to medical center if symptoms don't improve, worsen or new problems develop. The patient verbalized understanding.   Follow-up: Return in about 6 weeks (around 04/10/2020) for meter check with luke. See me in 3 months.   Gildardo Pounds, FNP-BC [facility] and [facility] [place], [place]   02/28/2020, 10:20 AM

## 2020-02-29 DIAGNOSIS — Z419 Encounter for procedure for purposes other than remedying health state, unspecified: Secondary | ICD-10-CM | POA: Diagnosis not present

## 2020-02-29 LAB — CMP14+EGFR
ALT: 24 IU/L (ref 0–32)
AST: 17 IU/L (ref 0–40)
Albumin/Globulin Ratio: 1.5 (ref 1.2–2.2)
Albumin: 5 g/dL — ABNORMAL HIGH (ref 3.8–4.8)
Alkaline Phosphatase: 62 IU/L (ref 44–121)
BUN/Creatinine Ratio: 22 (ref 9–23)
BUN: 14 mg/dL (ref 6–20)
Bilirubin Total: 0.4 mg/dL (ref 0.0–1.2)
CO2: 22 mmol/L (ref 20–29)
Calcium: 9.9 mg/dL (ref 8.7–10.2)
Chloride: 102 mmol/L (ref 96–106)
Creatinine, Ser: 0.64 mg/dL (ref 0.57–1.00)
GFR calc Af Amer: 133 mL/min/{1.73_m2} (ref >59)
GFR calc non Af Amer: 115 mL/min/{1.73_m2} (ref >59)
Globulin, Total: 3.4 g/dL (ref 1.5–4.5)
Glucose: 124 mg/dL — ABNORMAL HIGH (ref 65–99)
Potassium: 4.5 mmol/L (ref 3.5–5.2)
Sodium: 140 mmol/L (ref 134–144)
Total Protein: 8.4 g/dL (ref 6.0–8.5)

## 2020-03-07 ENCOUNTER — Other Ambulatory Visit: Payer: Self-pay

## 2020-03-09 ENCOUNTER — Encounter: Payer: Self-pay | Admitting: Internal Medicine

## 2020-03-09 ENCOUNTER — Other Ambulatory Visit: Payer: Self-pay

## 2020-03-09 ENCOUNTER — Ambulatory Visit (INDEPENDENT_AMBULATORY_CARE_PROVIDER_SITE_OTHER): Payer: Medicaid Other | Admitting: Internal Medicine

## 2020-03-09 ENCOUNTER — Telehealth: Payer: Self-pay | Admitting: Internal Medicine

## 2020-03-09 ENCOUNTER — Other Ambulatory Visit: Payer: Self-pay | Admitting: Internal Medicine

## 2020-03-09 VITALS — BP 124/76 | HR 84 | Ht 61.0 in | Wt 139.1 lb

## 2020-03-09 DIAGNOSIS — E0590 Thyrotoxicosis, unspecified without thyrotoxic crisis or storm: Secondary | ICD-10-CM

## 2020-03-09 LAB — T4, FREE: Free T4: 0.96 ng/dL (ref 0.60–1.60)

## 2020-03-09 LAB — TSH: TSH: 0.29 u[IU]/mL — ABNORMAL LOW (ref 0.35–4.50)

## 2020-03-09 MED ORDER — METHIMAZOLE 5 MG PO TABS: 10.0000 mg | ORAL_TABLET | Freq: Every day | ORAL | 1 refills | Status: DC

## 2020-03-09 MED FILL — METHIMAZOLE 5 MG TABS: 5 | 90 days supply | Qty: 180 | Fill #0

## 2020-03-09 NOTE — Progress Notes (Signed)
Name: Veronica Love  MRN/ DOB: [id number], 05/03/1985    Age/ Sex: 35 y.o., female     PCP: Gildardo Pounds, NP   Reason for Endocrinology Evaluation: Low TSH      Initial Endocrinology Clinic Visit: 03/03/2018    PATIENT IDENTIFIER: Veronica Love is a 35 y.o., female with a past medical history of  Dyslipidemia and T2DM.Marland Kitchen She has followed with [facility] since 03/03/2018 for consultative assistance with management of her low TSH    HISTORICAL SUMMARY:  Pt was noted to have low TSH at 0.232 uIU/mL during routine lab workup in 08/2017. Repeat labs in 11/2017 confirmed similar results.  Thyroid ultrasound in 02/2018 showed no evidence of thyroid nodules.   An order for thyroid uptake and scan was order in 05/2019 but this has not been done as the pt did not know where to go   Methimazole was started 11/2019  SUBJECTIVE:   Today (03/09/2020):  Veronica Love is here for 6 month follow up on subclinical hyperthyroidism. She is accompanied by an interpreter today   Her weight has been stable  She denies palpitations, or diarrhea  Denies local neck symptoms   She is on Nexplanon   Methimazole 5 mg daily   HISTORY:  Past Medical History:  Past Medical History:  Diagnosis Date  . Diabetes mellitus type 2, uncontrolled, with complications ([place])   . Gestational diabetes 2013  . 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 No pertinent past medical history    Past Surgical History:  Past Surgical History:  Procedure Laterality Date  . NO PAST SURGERIES      Social History:  reports that she has never smoked. She has never used smokeless tobacco. She reports that she does not drink alcohol and does not use drugs. Family History:  Family History  Problem Relation Age of Onset  . Diabetes Father      HOME MEDICATIONS: Allergies as of 03/09/2020      Reactions   Metformin And Related    Can not take 1000 mg of metformin as it causes nausea and headaches.       Medication List       Accurate as of March 09, 2020  7:22 AM. If you have any questions, ask your nurse or doctor.        Accu-Chek FastClix Lancets Misc Use as instructed. Check blood glucose level by fingerstick twice per day.  E11.65   Accu-Chek Guide test strip Generic drug: glucose blood Use as instructed. Check blood glucose level by fingerstick twice per day.  E11.65   Accu-Chek Guide w/Device Kit 1 each by Does not apply route daily.   atorvastatin 20 MG tablet Commonly known as: LIPITOR Take 1 tablet (20 mg total) by mouth daily at 6 PM.   cetirizine 10 MG tablet Commonly known as: ZYRTEC Take 1 tablet (10 mg total) by mouth daily.   empagliflozin 25 MG Tabs tablet Commonly known as: Jardiance Take 1 tablet (25 mg total) by mouth daily before breakfast.   etonogestrel 68 MG Impl implant Commonly known as: Nexplanon Inject 1 each (68 mg total) into the skin once.   lisinopril 2.5 MG tablet Commonly known as: ZESTRIL Take 1 tablet (2.5 mg total) by mouth daily.   metFORMIN 500 MG tablet Commonly known as: GLUCOPHAGE Take 1 tablet (500 mg total) by mouth 2 (two) times daily with a meal.   methimazole 5  MG tablet Commonly known as: TAPAZOLE Take 1 tablet (5 mg total) by mouth daily.   omeprazole 20 MG capsule Commonly known as: PRILOSEC Take 1 capsule (20 mg total) by mouth 2 (two) times daily before a meal for 7 days.         OBJECTIVE:   PHYSICAL EXAM: VS: There were no vitals taken for this visit.   EXAM: General: Pt appears well and is in NAD  Neck: General: Supple without adenopathy. Thyroid: Thyoid asymmetry noted R> L lobe.  No goiter or nodules appreciated. No thyroid bruit.  Lungs: Clear with good BS bilat with no rales, rhonchi, or wheezes  Heart: Auscultation: RRR.  Abdomen: Normoactive bowel sounds, soft, nontender, without masses or organomegaly palpable  Extremities:  BL LE: No pretibial edema normal ROM and strength.   Mental Status: Judgment, insight: Intact Orientation: Oriented to time, place, and person Mood and affect: No depression, anxiety, or agitation     DATA REVIEWED: Results for Veronica, Love (MRN [id number]) as of 03/09/2020 14:07  Ref. Range 03/09/2020 09:47  TSH Latest Ref Range: 0.35 - 4.50 uIU/mL 0.29 (L)  T4,Free(Direct) Latest Ref Range: 0.60 - 1.60 ng/dL 0.96    Results for Veronica, Love (MRN [id number]) as of 06/02/2018 07:55  Ref. Range 03/03/2018 09:10  TRAB Latest Ref Range: <=2.00 IU/L 1.06    Thyroid Ultrasound 03/17/2018 There are no discrete nodules which meet criteria for biopsy nor follow-up. The right lobe is larger than the left.    ASSESSMENT / PLAN / RECOMMENDATIONS:   1. Subclinical hyperthyroidism :  - She continues to be clinically euthyroid  - We attempted to proceed with thyroid uptake ans scan in 05/2019 but she did not do it stating she didn't know where to go. - We have opted to treat with small dose of methimazole as this has been going on for a long time without improvement.   - TSH continues to be low, will increase as below     Medication  Increase  methimazole 5 mg, to 2 tabs daily      F/u in 4 months.     Signed electronically by: Mack Guise, MD  [facility] Endocrinology  [facility] [address] [place], [place] [address] Phone: [phone] FAX: [phone]      CC: Gildardo Pounds, NP [place] [address] Phone: [phone]  Fax: [phone]   Return to Endocrinology clinic as below: Future Appointments  Date Time Provider [place]  03/09/2020  9:10 AM Sherlyn Ebbert, Melanie Crazier, MD [facility] None  04/10/2020  8:30 AM Tresa Endo, RPH-CPP [facility] None  05/29/2020  9:10 AM Gildardo Pounds, NP [facility] None  07/03/2020 12:30 PM [facility] 2 [facility] [place]

## 2020-03-09 NOTE — Telephone Encounter (Signed)
PLease ask the pt to increase Methimazole to 2 tablets daily. She can take then together. The current dose is not working , she is still over active    Please ask for [place] interpreter , the one listed on the chart is difficult to get    Thanks    Abby Raelyn Mora, MD  [facility] Endocrinology  [facility] [address] [place], [place] [address] Phone: [phone] FAX: [phone]

## 2020-03-09 NOTE — Telephone Encounter (Signed)
Message left for patient to return my call.  

## 2020-03-09 NOTE — Telephone Encounter (Signed)
Spoken to patient and notified Dr Shamleffer's comments. Verbalized understanding.   

## 2020-03-09 NOTE — Patient Instructions (Signed)
-   Continue Methimazole 5 mg ,1 tablet for now

## 2020-03-17 MED FILL — ACCU-CHEK GUIDE TEST STRIP: 50 days supply | Qty: 100 | Fill #0

## 2020-03-17 MED FILL — ACCU-CHEK FASTCLIX LANCETS: 34 days supply | Qty: 102 | Fill #0

## 2020-03-17 MED FILL — METHIMAZOLE 5 MG TABS: 5 | 90 days supply | Qty: 180 | Fill #0

## 2020-03-17 MED FILL — OMEPRAZOLE 20 MG CAP: 20 | 7 days supply | Qty: 14 | Fill #0

## 2020-03-17 MED FILL — METFORMIN HCL 500 MG TABS: 500 | 90 days supply | Qty: 180 | Fill #1

## 2020-03-28 DIAGNOSIS — Z419 Encounter for procedure for purposes other than remedying health state, unspecified: Secondary | ICD-10-CM | POA: Diagnosis not present

## 2020-04-10 ENCOUNTER — Ambulatory Visit: Payer: Medicaid Other | Attending: Nurse Practitioner | Admitting: Pharmacist

## 2020-04-10 ENCOUNTER — Encounter: Payer: Self-pay | Admitting: Pharmacist

## 2020-04-10 ENCOUNTER — Other Ambulatory Visit: Payer: Self-pay

## 2020-04-10 ENCOUNTER — Other Ambulatory Visit: Payer: Self-pay | Admitting: Family Medicine

## 2020-04-10 DIAGNOSIS — E1165 Type 2 diabetes mellitus with hyperglycemia: Secondary | ICD-10-CM

## 2020-04-10 LAB — GLUCOSE, POCT (MANUAL RESULT ENTRY): POC Glucose: 142 mg/dl — AB (ref 70–99)

## 2020-04-10 MED ORDER — JANUMET 50-500 MG PO TABS: 1.0000 | ORAL_TABLET | Freq: Two times a day (BID) | ORAL | 2 refills | Status: DC

## 2020-04-10 NOTE — Progress Notes (Signed)
    S:    PCP: Zelda  No chief complaint on file.  Patient arrives in good spirits.  Presents for diabetes evaluation, education, and management. Patient was referred and last seen by Primary Care Provider on 02/28/2020. At that visit, Jardiance dose was increased.   Family/Social History:  - FHx: DM (father) - Alcohol: denies - Tobacco: never smoker  [employer] affordability: [place] Medicaid  Patient reports adherence with medications.  Current diabetes medications include: metformin 500 mg BID, Jardiance 25 mg daily **Of note, she has tried Victoza before but this made her dizzy. She is not willing to try injections at this time.   Patient denies hypoglycemic events.  Patient reported dietary habits:  - Admits to regular intake of carbs (noodles, rice, potatoes, bread)  Patient-reported exercise habits:  - reports 30 minutes daily    Patient denies nocturia.  Patient denies neuropathy. Patient denies visual changes. Patient reports self foot exams.     O:  POCT: 142  Lab Results  Component Value Date   HGBA1C 8.2 (A) 02/28/2020   There were no vitals filed for this visit.  Lipid Panel     Component Value Date/Time   CHOL 164 10/05/2019 0948   TRIG 109 10/05/2019 0948   HDL 50 10/05/2019 0948   CHOLHDL 3.3 10/05/2019 0948   CHOLHDL 6.2 (H) 11/30/2014 1053   VLDL 42 (H) 11/30/2014 1053   LDLCALC 94 10/05/2019 0948   Home CBG averages:  7 -day: 167 14-day: 163 30-day: 153  Clinical ASCVD: No  The ASCVD Risk score Denman George DC Jr., et al., 2013) failed to calculate for the following reasons:   The 2013 ASCVD risk score is only valid for ages 26 to 35   A/P: Diabetes longstanding currently uncontrolled. Patient is able to verbalize appropriate hypoglycemia management plan. Patient is adherent with medication. She does not wish to try an injectable at this time.  -Stop metformin.  -Start Janumet 50-500 mg BID.  -Continue Jardiance 25 mg daily.   -Extensively discussed pathophysiology of DM, recommended lifestyle interventions, dietary effects on glycemic control -Counseled on s/sx of and management of hypoglycemia -Next A1C anticipated 04/2020.   Written patient instructions provided. Total time in face to face counseling 15 minutes.   Follow up Clinic Visit with me in 1 month.  Butch Penny, PharmD, [place], CPP Clinical Pharmacist [facility] [phone]

## 2020-04-28 ENCOUNTER — Other Ambulatory Visit: Payer: Self-pay

## 2020-04-28 DIAGNOSIS — E119 Type 2 diabetes mellitus without complications: Secondary | ICD-10-CM

## 2020-04-28 DIAGNOSIS — Z419 Encounter for procedure for purposes other than remedying health state, unspecified: Secondary | ICD-10-CM | POA: Diagnosis not present

## 2020-04-29 ENCOUNTER — Other Ambulatory Visit: Payer: Self-pay

## 2020-05-15 ENCOUNTER — Other Ambulatory Visit: Payer: Self-pay

## 2020-05-15 ENCOUNTER — Ambulatory Visit: Payer: Medicaid Other | Attending: Nurse Practitioner | Admitting: Pharmacist

## 2020-05-15 DIAGNOSIS — E1165 Type 2 diabetes mellitus with hyperglycemia: Secondary | ICD-10-CM | POA: Diagnosis not present

## 2020-05-15 LAB — POCT GLYCOSYLATED HEMOGLOBIN (HGB A1C): Hemoglobin A1C: 7.5 % — AB (ref 4.0–5.6)

## 2020-05-15 MED FILL — Glucose Blood Test Strip: 25 days supply | Qty: 100 | Fill #0 | Status: CN

## 2020-05-15 MED FILL — Sitagliptin-Metformin HCl Tab 50-500 MG: ORAL | 30 days supply | Qty: 60 | Fill #0 | Status: CN

## 2020-05-15 NOTE — Progress Notes (Signed)
    S:    PCP: Zelda  No chief complaint on file.  Patient arrives in good spirits.  Presents for diabetes evaluation, education, and management. Patient was referred and last seen by Primary Care Provider on 02/28/2020. I saw her on 04/10/2020 and started Janumet. She cannot tolerate 1000 mg BID dose of metformin, therefore, I started her on 50-500mg  BID of Janumet.   Family/Social History:  - FHx: DM (father) - Alcohol: denies - Tobacco: never smoker  [employer]chant [employer] affordability: [place] Medicaid  Patient reports adherence with medications.  Current diabetes medications include: Janumet 50-500 mg BID, Jardiance 25 mg daily  Patient denies hypoglycemic events.  Patient reported dietary habits:  - Admits to regular intake of carbs (noodles, rice, potatoes, bread)  Patient-reported exercise habits:  - reports 30 minutes daily    Patient denies nocturia.  Patient denies neuropathy. Patient denies visual changes. Patient reports self foot exams.     O: Lab Results  Component Value Date   HGBA1C 7.5 (A) 05/15/2020   There were no vitals filed for this visit.  Lipid Panel     Component Value Date/Time   CHOL 164 10/05/2019 0948   TRIG 109 10/05/2019 0948   HDL 50 10/05/2019 0948   CHOLHDL 3.3 10/05/2019 0948   CHOLHDL 6.2 (H) 11/30/2014 1053   VLDL 42 (H) 11/30/2014 1053   LDLCALC 94 10/05/2019 0948   Home CBG averages:  7 -day: 147 14-day: 145 30-day: 143  Clinical ASCVD: No  The ASCVD Risk score Denman George DC Jr., et al., 2013) failed to calculate for the following reasons:   The 2013 ASCVD risk score is only valid for ages 76 to 1   A/P: Diabetes longstanding currently uncontrolled but improving. A1c today is 7.5, down from 8.2. Patient is able to verbalize appropriate hypoglycemia management plan. Patient is adherent with medication.  -Continue current regimen.  -Extensively discussed pathophysiology of DM, recommended lifestyle interventions,  dietary effects on glycemic control -Counseled on s/sx of and management of hypoglycemia -Next A1C anticipated 07/2020.   Written patient instructions provided. Total time in face to face counseling 15 minutes.   Follow up Clinic Visit with Zelda in June.   Butch Penny, PharmD, Patsy [place], CPP Clinical Pharmacist [facility] [phone]

## 2020-05-22 ENCOUNTER — Other Ambulatory Visit: Payer: Self-pay

## 2020-05-22 MED FILL — Sitagliptin-Metformin HCl Tab 50-500 MG: ORAL | 30 days supply | Qty: 60 | Fill #0 | Status: AC

## 2020-05-22 MED FILL — Glucose Blood Test Strip: 50 days supply | Qty: 100 | Fill #0 | Status: AC

## 2020-05-23 ENCOUNTER — Other Ambulatory Visit: Payer: Self-pay

## 2020-05-28 DIAGNOSIS — Z419 Encounter for procedure for purposes other than remedying health state, unspecified: Secondary | ICD-10-CM | POA: Diagnosis not present

## 2020-05-29 ENCOUNTER — Ambulatory Visit: Payer: Medicaid Other | Admitting: Nurse Practitioner

## 2020-06-06 ENCOUNTER — Other Ambulatory Visit: Payer: Self-pay

## 2020-06-06 ENCOUNTER — Other Ambulatory Visit: Payer: Self-pay | Admitting: Nurse Practitioner

## 2020-06-06 DIAGNOSIS — E1165 Type 2 diabetes mellitus with hyperglycemia: Secondary | ICD-10-CM

## 2020-06-06 MED ORDER — EMPAGLIFLOZIN 25 MG PO TABS
ORAL_TABLET | ORAL | 0 refills | Status: DC
  Filled 2020-06-06 – 2020-06-13 (×2): qty 90, 90d supply, fill #0

## 2020-06-06 MED FILL — Lisinopril Tab 2.5 MG: ORAL | 90 days supply | Qty: 90 | Fill #0 | Status: CN

## 2020-06-06 MED FILL — Atorvastatin Calcium Tab 20 MG (Base Equivalent): ORAL | 90 days supply | Qty: 90 | Fill #0 | Status: CN

## 2020-06-13 ENCOUNTER — Other Ambulatory Visit: Payer: Self-pay

## 2020-06-13 MED FILL — Atorvastatin Calcium Tab 20 MG (Base Equivalent): ORAL | 90 days supply | Qty: 90 | Fill #0 | Status: AC

## 2020-06-13 MED FILL — Lisinopril Tab 2.5 MG: ORAL | 90 days supply | Qty: 90 | Fill #0 | Status: AC

## 2020-06-14 ENCOUNTER — Other Ambulatory Visit: Payer: Self-pay

## 2020-06-27 ENCOUNTER — Other Ambulatory Visit: Payer: Self-pay

## 2020-06-27 MED FILL — Sitagliptin-Metformin HCl Tab 50-500 MG: ORAL | 30 days supply | Qty: 60 | Fill #1 | Status: AC

## 2020-06-28 DIAGNOSIS — Z419 Encounter for procedure for purposes other than remedying health state, unspecified: Secondary | ICD-10-CM | POA: Diagnosis not present

## 2020-07-03 ENCOUNTER — Ambulatory Visit (HOSPITAL_COMMUNITY)
Admission: RE | Admit: 2020-07-03 | Discharge: 2020-07-03 | Disposition: A | Discharge status: Home / Self Care | Payer: Medicaid Other | Source: Ambulatory Visit | Attending: Gastroenterology | Admitting: Gastroenterology

## 2020-07-03 ENCOUNTER — Other Ambulatory Visit: Payer: Self-pay

## 2020-07-03 DIAGNOSIS — A048 Other specified bacterial intestinal infections: Secondary | ICD-10-CM

## 2020-07-03 DIAGNOSIS — R109 Unspecified abdominal pain: Secondary | ICD-10-CM | POA: Diagnosis not present

## 2020-07-03 DIAGNOSIS — R933 Abnormal findings on diagnostic imaging of other parts of digestive tract: Secondary | ICD-10-CM | POA: Diagnosis not present

## 2020-07-03 DIAGNOSIS — K8020 Calculus of gallbladder without cholecystitis without obstruction: Secondary | ICD-10-CM | POA: Diagnosis not present

## 2020-07-03 DIAGNOSIS — N281 Cyst of kidney, acquired: Secondary | ICD-10-CM | POA: Diagnosis not present

## 2020-07-03 DIAGNOSIS — M47816 Spondylosis without myelopathy or radiculopathy, lumbar region: Secondary | ICD-10-CM | POA: Diagnosis not present

## 2020-07-03 DIAGNOSIS — D7389 Other diseases of spleen: Secondary | ICD-10-CM | POA: Diagnosis not present

## 2020-07-03 LAB — POCT I-STAT CREATININE: Creatinine, Ser: 0.5 mg/dL (ref 0.44–1.00)

## 2020-07-03 MED ORDER — SODIUM CHLORIDE (PF) 0.9 % IJ SOLN
INTRAMUSCULAR | Status: AC
  Filled 2020-07-03: qty 50

## 2020-07-03 MED ORDER — IOHEXOL 300 MG/ML  SOLN
100.0000 mL | Freq: Once | INTRAMUSCULAR | Status: AC | PRN
  Administered 2020-07-03: 100 mL via INTRAVENOUS

## 2020-07-07 DIAGNOSIS — H11153 Pinguecula, bilateral: Secondary | ICD-10-CM | POA: Diagnosis not present

## 2020-07-07 DIAGNOSIS — H1045 Other chronic allergic conjunctivitis: Secondary | ICD-10-CM | POA: Diagnosis not present

## 2020-07-07 DIAGNOSIS — E119 Type 2 diabetes mellitus without complications: Secondary | ICD-10-CM | POA: Diagnosis not present

## 2020-07-07 DIAGNOSIS — H40013 Open angle with borderline findings, low risk, bilateral: Secondary | ICD-10-CM | POA: Diagnosis not present

## 2020-07-12 ENCOUNTER — Encounter: Payer: Self-pay | Admitting: Internal Medicine

## 2020-07-12 ENCOUNTER — Other Ambulatory Visit: Payer: Self-pay

## 2020-07-12 ENCOUNTER — Ambulatory Visit (INDEPENDENT_AMBULATORY_CARE_PROVIDER_SITE_OTHER): Payer: Medicaid Other | Admitting: Internal Medicine

## 2020-07-12 VITALS — BP 118/64 | HR 84 | Ht 61.0 in | Wt 139.0 lb

## 2020-07-12 DIAGNOSIS — E0590 Thyrotoxicosis, unspecified without thyrotoxic crisis or storm: Secondary | ICD-10-CM | POA: Diagnosis not present

## 2020-07-12 LAB — TSH: TSH: 0.96 u[IU]/mL (ref 0.35–4.50)

## 2020-07-12 LAB — T4, FREE: Free T4: 0.91 ng/dL (ref 0.60–1.60)

## 2020-07-12 MED ORDER — METHIMAZOLE 5 MG PO TABS
5.0000 mg | ORAL_TABLET | Freq: Every day | ORAL | 1 refills | Status: DC
  Filled 2020-07-12 – 2020-10-23 (×2): qty 90, 90d supply, fill #0

## 2020-07-12 NOTE — Progress Notes (Signed)
Name: Veronica Love  MRN/ DOB: [id number], 1985/11/23    Age/ Sex: 35 y.o., female     PCP: Gildardo Pounds, NP   Reason for Endocrinology Evaluation: Low TSH      Initial Endocrinology Clinic Visit: 03/03/2018    PATIENT IDENTIFIER: Veronica Love is a 35 y.o., female with a past medical history of  Dyslipidemia and T2DM.Marland Kitchen She has followed with [place] [facility] since 03/03/2018 for consultative assistance with management of her low TSH    HISTORICAL SUMMARY:  Pt was noted to have low TSH at 0.232 uIU/mL during routine lab workup in 08/2017. Repeat labs in 11/2017 confirmed similar results.  Thyroid ultrasound in 02/2018 showed no evidence of thyroid nodules.   An order for thyroid uptake and scan was order in 05/2019 but this has not been done as the pt did not know where to go   Methimazole was started 11/2019  SUBJECTIVE:   Today (07/12/2020):  [name] is here for a follow up on subclinical hyperthyroidism. She is accompanied by an interpreter today   Her weight has been stable  She denies palpitations, or diarrhea  Denies local neck symptoms   She is on Nexplanon   Methimazole 5 mg BID- has been taking 1 tablet   HISTORY:  Past Medical History:  Past Medical History:  Diagnosis Date   Diabetes mellitus type 2, uncontrolled, with complications ([place])    Gestational diabetes 2013   History of positive PPD 2009   with negative chest x-ray   No pertinent past medical history    Past Surgical History:  Past Surgical History:  Procedure Laterality Date   NO PAST SURGERIES     Social History:  reports that she has never smoked. She has never used smokeless tobacco. She reports that she does not drink alcohol and does not use drugs. Family History:  Family History  Problem Relation Age of Onset   Diabetes Father      HOME MEDICATIONS: Allergies as of 07/12/2020       Reactions   Metformin And Related    Can not take 1000 mg of metformin as it causes nausea and  headaches.   Victoza [liraglutide] Other (See Comments)   Dizziness        Medication List        Accurate as of July 12, 2020  9:09 AM. If you have any questions, ask your nurse or doctor.          Accu-Chek FastClix Lancets Misc USE AS INSTRUCTED. CHECK BLOOD GLUCOSE LEVEL BY FINGERSTICK TWICE PER DAY.   Accu-Chek Guide test strip Generic drug: glucose blood USE AS INSTRUCTED. CHECK BLOOD GLUCOSE LEVEL BY FINGERSTICK TWICE PER DAY. E11.65   Accu-Chek Guide w/Device Kit 1 each by Does not apply route daily.   atorvastatin 20 MG tablet Commonly known as: LIPITOR TAKE 1 TABLET (20 MG TOTAL) BY MOUTH DAILY AT 6 PM.   cetirizine 10 MG tablet Commonly known as: ZYRTEC Take 1 tablet (10 mg total) by mouth daily.   etonogestrel 68 MG Impl implant Commonly known as: Nexplanon Inject 1 each (68 mg total) into the skin once.   Janumet 50-500 MG tablet Generic drug: sitaGLIPtin-metformin TAKE 1 TABLET BY MOUTH 2 (TWO) TIMES DAILY WITH A MEAL.   Jardiance 25 MG Tabs tablet Generic drug: empagliflozin TAKE 1 TABLET (25 MG TOTAL) BY MOUTH DAILY BEFORE BREAKFAST.   lisinopril 2.5 MG tablet Commonly known as: ZESTRIL TAKE 1  TABLET (2.5 MG TOTAL) BY MOUTH DAILY.   methimazole 5 MG tablet Commonly known as: TAPAZOLE TAKE 2 TABLETS (10 MG TOTAL) BY MOUTH DAILY.   omeprazole 20 MG capsule Commonly known as: PRILOSEC TAKE 1 CAPSULE (20 MG TOTAL) BY MOUTH 2 (TWO) TIMES DAILY BEFORE A MEAL FOR 7 DAYS.          OBJECTIVE:   PHYSICAL EXAM: VS: BP 118/64   Pulse 84   Ht _0  (1.549 m)   Wt 139 lb (63 kg)   SpO2 99%   BMI 26.26 kg/m    EXAM: General: Pt appears well and is in NAD  Neck: General: Supple without adenopathy. Thyroid: No goiter or nodules appreciated.  Lungs: Clear with good BS bilat with no rales, rhonchi, or wheezes  Heart: Auscultation: RRR.  Abdomen: Normoactive bowel sounds, soft, nontender, without masses or organomegaly palpable   Extremities:  BL LE: No pretibial edema normal ROM and strength.  Mental Status: Judgment, insight: Intact Orientation: Oriented to time, place, and person Mood and affect: No depression, anxiety, or agitation     DATA REVIEWED:  Results for ANALEESE, ANDREATTA (MRN [id number]) as of 07/12/2020 12:49  Ref. Range 07/12/2020 09:13  TSH Latest Ref Range: 0.35 - 4.50 uIU/mL 0.96  T4,Free(Direct) Latest Ref Range: 0.60 - 1.60 ng/dL 0.91  TFT's  Results for TARIKA, MCKETHAN (MRN [id number]) as of 06/02/2018 07:55  Ref. Range 03/03/2018 09:10  TRAB Latest Ref Range: <=2.00 IU/L 1.06    Thyroid Ultrasound 03/17/2018 There are no discrete nodules which meet criteria for biopsy nor follow-up. The right lobe is larger than the left.     ASSESSMENT / PLAN / RECOMMENDATIONS:   Subclinical hyperthyroidism :  - She continues to be clinically euthyroid  - We attempted to proceed with thyroid uptake ans scan in 05/2019 but she missed that appointment and opted to treat medically  - She has been on 1 tablet of methimazole daily instead of 2 tabs as advised on last visit, but TFT's are normal and will continue current dose    Medication  Continue methimazole 5 mg, 1 tabs daily    Addendum: Called phone number listed and informed spouse to continue methimazole 1 tablet daily at 07/12/2020  F/u in 4 months.     Signed electronically by: Mack Guise, MD  [facility] End[facility]  [facility] [address] [place], [place] [address] Phone: [phone] FAX: [phone]      CC: Gildardo Pounds, NP [place] [address] Phone: [phone]  Fax: [phone]   Return to Endocrinology clinic as below: Future Appointments  Date Time Provider [place]  07/12/2020  9:10 AM Inetha Maret, Melanie Crazier, MD [facility] None  07/21/2020  9:50 AM Gildardo Pounds, NP [facility] None  08/14/2020  9:00 AM Tresa Endo, RPH-CPP [facility]  None

## 2020-07-19 ENCOUNTER — Other Ambulatory Visit: Payer: Self-pay

## 2020-07-21 ENCOUNTER — Ambulatory Visit: Payer: Medicaid Other | Attending: Nurse Practitioner | Admitting: Nurse Practitioner

## 2020-07-21 ENCOUNTER — Other Ambulatory Visit: Payer: Self-pay

## 2020-07-21 ENCOUNTER — Encounter: Payer: Self-pay | Admitting: Nurse Practitioner

## 2020-07-21 ENCOUNTER — Other Ambulatory Visit: Payer: Self-pay | Admitting: Family Medicine

## 2020-07-21 VITALS — BP 111/74 | HR 81 | Resp 16 | Wt 141.0 lb

## 2020-07-21 DIAGNOSIS — E1165 Type 2 diabetes mellitus with hyperglycemia: Secondary | ICD-10-CM | POA: Diagnosis not present

## 2020-07-21 DIAGNOSIS — J029 Acute pharyngitis, unspecified: Secondary | ICD-10-CM | POA: Diagnosis not present

## 2020-07-21 MED ORDER — JANUMET 50-500 MG PO TABS
1.0000 | ORAL_TABLET | Freq: Two times a day (BID) | ORAL | 2 refills | Status: DC
  Filled 2020-07-21: qty 60, 30d supply, fill #0

## 2020-07-21 MED ORDER — CEPACOL SORE THROAT EX ST 15-3.6 MG MT LOZG
LOZENGE | OROMUCOSAL | 0 refills | Status: DC
Start: 2020-07-21 — End: 2020-11-20
  Filled 2020-07-21: qty 168, fill #0

## 2020-07-21 MED ORDER — JANUMET 50-500 MG PO TABS
1.0000 | ORAL_TABLET | Freq: Two times a day (BID) | ORAL | 1 refills | Status: DC
  Filled 2020-07-21 – 2020-08-01 (×2): qty 180, 90d supply, fill #0

## 2020-07-21 NOTE — Progress Notes (Signed)
04/2020- A1C- 7.5   Sore throat x 3 days Denies fever, cough, or headache

## 2020-07-21 NOTE — Progress Notes (Signed)
Assessment & Plan:  Krystine was seen today for sore throat.  Diagnoses and all orders for this visit:  Viral pharyngitis -     Benzocaine-Menthol (CEPACOL SORE THROAT EX ST) 15-3.6 MG LOZG; Place the lozenge in your mouth between your gums and your cheek. Allow the lozenge to dissolve slowly over 20-30 minutes, moving it around every so often from one side of your mouth to the other. Do not chew, suck, or swallow it.  Type 2 diabetes mellitus with hyperglycemia, without long-term current use of insulin (HCC) -     sitaGLIPtin-metformin (JANUMET) 50-500 MG tablet; TAKE 1 TABLET BY MOUTH 2 (TWO) TIMES DAILY WITH A MEAL.   Patient has been counseled on age-appropriate routine health concerns for screening and prevention. These are reviewed and up-to-date. Referrals have been placed accordingly. Immunizations are up-to-date or declined.    Subjective:   Chief Complaint  Patient presents with   Sore Throat   HPI LARRA CRUNKLETON 35 y.o. female presents to office today with complaints of viral.  Sore Throat Patient complains of sore throat. Associated symptoms include sore throat, swollen glands, and pain with swallowing .Onset of symptoms was 3 days ago, unchanged since that time. She is drinking plenty of fluids. She has not had recent close exposure to someone with proven streptococcal pharyngitis.    DM 2 A1c is not due for a few weeks. She does have her meter with her today and average readings are as follows:  7 day 151 14 day 150 30 days  159 90 days 152 She is not consistently monitoring her blood glucose levels daily. She is currently taking Janumet 50-500 mg twice daily and Jardiance 25 mg daily as prescribed.  She is on renal dose ACE and atorvastatin 20 mg daily.  LDL is not at goal. Lab Results  Component Value Date   HGBA1C 7.5 (A) 05/15/2020    Lab Results  Component Value Date   LDLCALC 94 10/05/2019       Review of Systems  Constitutional:  Negative for fever,  malaise/fatigue and weight loss.  HENT:  Positive for sore throat. Negative for nosebleeds.   Eyes: Negative.  Negative for blurred vision, double vision and photophobia.  Respiratory: Negative.  Negative for cough and shortness of breath.   Cardiovascular: Negative.  Negative for chest pain, palpitations and leg swelling.  Gastrointestinal: Negative.  Negative for heartburn, nausea and vomiting.  Musculoskeletal: Negative.  Negative for myalgias.  Neurological: Negative.  Negative for dizziness, focal weakness, seizures and headaches.  Psychiatric/Behavioral: Negative.  Negative for suicidal ideas.    Past Medical History:  Diagnosis Date   Diabetes mellitus type 2, uncontrolled, with complications ([place])    Gestational diabetes 2013   History of positive PPD 2009   with negative chest x-ray   No pertinent past medical history     Past Surgical History:  Procedure Laterality Date   NO PAST SURGERIES      Family History  Problem Relation Age of Onset   Diabetes Father     Social History Reviewed with no changes to be made today.   Outpatient Medications Prior to Visit  Medication Sig Dispense Refill   Accu-Chek FastClix Lancets MISC USE AS INSTRUCTED. CHECK BLOOD GLUCOSE LEVEL BY FINGERSTICK TWICE PER DAY. 102 each 12   atorvastatin (LIPITOR) 20 MG tablet TAKE 1 TABLET (20 MG TOTAL) BY MOUTH DAILY AT 6 PM. 90 tablet 3   Blood Glucose Monitoring Suppl (ACCU-CHEK GUIDE) w/Device KIT  1 each by Does not apply route daily. 1 kit 0   cetirizine (ZYRTEC) 10 MG tablet Take 1 tablet (10 mg total) by mouth daily. 90 tablet 3   empagliflozin (JARDIANCE) 25 MG TABS tablet TAKE 1 TABLET (25 MG TOTAL) BY MOUTH DAILY BEFORE BREAKFAST. 90 tablet 0   etonogestrel (NEXPLANON) 68 MG IMPL implant Inject 1 each (68 mg total) into the skin once. 1 each 0   glucose blood test strip USE AS INSTRUCTED. CHECK BLOOD GLUCOSE LEVEL BY FINGERSTICK TWICE PER DAY. E11.65 100 strip 12   lisinopril (ZESTRIL)  2.5 MG tablet TAKE 1 TABLET (2.5 MG TOTAL) BY MOUTH DAILY. 90 tablet 1   methimazole (TAPAZOLE) 5 MG tablet Take 1 tablet (5 mg total) by mouth daily. 90 tablet 1   omeprazole (PRILOSEC) 20 MG capsule TAKE 1 CAPSULE (20 MG TOTAL) BY MOUTH 2 (TWO) TIMES DAILY BEFORE A MEAL FOR 7 DAYS. 14 capsule 0   sitaGLIPtin-metformin (JANUMET) 50-500 MG tablet TAKE 1 TABLET BY MOUTH 2 (TWO) TIMES DAILY WITH A MEAL. 60 tablet 2   No facility-administered medications prior to visit.    Allergies  Allergen Reactions   Metformin And Related     Can not take 1000 mg of metformin as it causes nausea and headaches.   Victoza [Liraglutide] Other (See Comments)    Dizziness       Objective:    BP 111/74   Pulse 81   Resp 16   Wt 141 lb (64 kg)   SpO2 100%   BMI 26.64 kg/m  Wt Readings from Last 3 Encounters:  07/21/20 141 lb (64 kg)  07/12/20 139 lb (63 kg)  03/09/20 139 lb 2 oz (63.1 kg)    Physical Exam Vitals and nursing note reviewed.  Constitutional:      Appearance: She is well-developed.  HENT:     Head: Normocephalic and atraumatic.     Mouth/Throat:     Mouth: Mucous membranes are moist.     Tongue: No lesions. Tongue does not deviate from midline.     Palate: No mass.     Pharynx: Uvula midline. No pharyngeal swelling, oropharyngeal exudate, posterior oropharyngeal erythema or uvula swelling.     Tonsils: No tonsillar exudate or tonsillar abscesses.     Comments: Right tonsillar adenopathy Cardiovascular:     Rate and Rhythm: Normal rate and regular rhythm.     Heart sounds: Normal heart sounds. No murmur heard.   No friction rub. No gallop.  Pulmonary:     Effort: Pulmonary effort is normal. No tachypnea or respiratory distress.     Breath sounds: Normal breath sounds. No decreased breath sounds, wheezing, rhonchi or rales.  Chest:     Chest wall: No tenderness.  Abdominal:     General: Bowel sounds are normal.     Palpations: Abdomen is soft.  Musculoskeletal:         General: Normal range of motion.     Cervical back: Normal range of motion.  Skin:    General: Skin is warm and dry.  Neurological:     Mental Status: She is alert and oriented to person, place, and time.     Coordination: Coordination normal.  Psychiatric:        Behavior: Behavior normal. Behavior is cooperative.        Thought Content: Thought content normal.        Judgment: Judgment normal.         Patient has been  counseled extensively about nutrition and exercise as well as the importance of adherence with medications and regular follow-up. The patient was given clear instructions to go to ER or return to medical center if symptoms don't improve, worsen or new problems develop. The patient verbalized understanding.   Follow-up: Return for labs with [facility]. A1c. see me in october.   Gildardo Pounds, FNP-BC [facility] and [facility] [place], [place]   07/21/2020, 12:00 PM

## 2020-07-24 ENCOUNTER — Other Ambulatory Visit: Payer: Self-pay | Admitting: Gastroenterology

## 2020-07-24 ENCOUNTER — Other Ambulatory Visit: Payer: Self-pay

## 2020-07-24 DIAGNOSIS — E119 Type 2 diabetes mellitus without complications: Secondary | ICD-10-CM

## 2020-07-28 DIAGNOSIS — Z419 Encounter for procedure for purposes other than remedying health state, unspecified: Secondary | ICD-10-CM | POA: Diagnosis not present

## 2020-08-01 ENCOUNTER — Other Ambulatory Visit: Payer: Self-pay

## 2020-08-01 MED FILL — Glucose Blood Test Strip: 50 days supply | Qty: 100 | Fill #1 | Status: AC

## 2020-08-01 MED FILL — Lancets: 50 days supply | Qty: 102 | Fill #0 | Status: AC

## 2020-08-11 ENCOUNTER — Ambulatory Visit: Payer: Medicaid Other | Attending: Nurse Practitioner

## 2020-08-11 ENCOUNTER — Other Ambulatory Visit: Payer: Self-pay

## 2020-08-11 DIAGNOSIS — E1165 Type 2 diabetes mellitus with hyperglycemia: Secondary | ICD-10-CM | POA: Diagnosis not present

## 2020-08-14 ENCOUNTER — Ambulatory Visit: Payer: Medicaid Other | Attending: Nurse Practitioner | Admitting: Pharmacist

## 2020-08-14 ENCOUNTER — Other Ambulatory Visit: Payer: Self-pay

## 2020-08-14 DIAGNOSIS — E1165 Type 2 diabetes mellitus with hyperglycemia: Secondary | ICD-10-CM

## 2020-08-14 LAB — HEMOGLOBIN A1C
Est. average glucose Bld gHb Est-mCnc: 169 mg/dL
Hgb A1c MFr Bld: 7.5 % — ABNORMAL HIGH (ref 4.8–5.6)

## 2020-08-14 LAB — MICROALBUMIN / CREATININE URINE RATIO
Creatinine, Urine: 15.3 mg/dL
Microalb/Creat Ratio: <20 mg/g creat (ref 0–29)
Microalbumin, Urine: <3 ug/mL

## 2020-08-14 LAB — BASIC METABOLIC PANEL
BUN/Creatinine Ratio: 20 (ref 9–23)
BUN: 13 mg/dL (ref 6–20)
CO2: 23 mmol/L (ref 20–29)
Calcium: 9.3 mg/dL (ref 8.7–10.2)
Chloride: 103 mmol/L (ref 96–106)
Creatinine, Ser: 0.64 mg/dL (ref 0.57–1.00)
Glucose: 128 mg/dL — ABNORMAL HIGH (ref 65–99)
Potassium: 4.1 mmol/L (ref 3.5–5.2)
Sodium: 137 mmol/L (ref 134–144)
eGFR: 117 mL/min/{1.73_m2} (ref >59)

## 2020-08-14 LAB — POCT GLYCOSYLATED HEMOGLOBIN (HGB A1C): Hemoglobin A1C: 7.3 % — AB (ref 4.0–5.6)

## 2020-08-14 MED ORDER — JANUMET 50-500 MG PO TABS
ORAL_TABLET | ORAL | 1 refills | Status: DC
Start: 2020-08-14 — End: 2020-11-20
  Filled 2020-08-14: qty 90, fill #0
  Filled 2020-11-15: qty 90, 30d supply, fill #0

## 2020-08-14 NOTE — Progress Notes (Signed)
    S:    PCP: Zelda  No chief complaint on file.  Patient arrives in good spirits.  Presents for diabetes evaluation, education, and management. Patient was referred and last seen by Primary Care Provider on 07/21/2020. She is here today for her A1c. She denies any complaints. Reports no symptoms or medication side effects in regards to her Janumet or Jardiance.   Family/Social History:  - FHx: DM (father) - Alcohol: denies - Tobacco: never smoker  [employer]chant [employer] affordability: [place] Medicaid  Patient reports adherence with medications.  Current diabetes medications include: Janumet 50-500 mg BID, Jardiance 25 mg daily  Patient denies hypoglycemic events.  Patient reported dietary habits:  - Admits to regular intake of carbs (noodles, rice, potatoes, bread)  Patient-reported exercise habits:  - reports 30 minutes daily    Patient denies nocturia.  Patient denies neuropathy. Patient denies visual changes. Patient reports self foot exams.     O: Lab Results  Component Value Date   HGBA1C 7.3 (A) 08/14/2020   There were no vitals filed for this visit.  Lipid Panel     Component Value Date/Time   CHOL 164 10/05/2019 0948   TRIG 109 10/05/2019 0948   HDL 50 10/05/2019 0948   CHOLHDL 3.3 10/05/2019 0948   CHOLHDL 6.2 (H) 11/30/2014 1053   VLDL 42 (H) 11/30/2014 1053   LDLCALC 94 10/05/2019 0948   Home CBG averages:  7 -day: 147 14-day: 145 30-day: 143  Clinical ASCVD: No  The ASCVD Risk score Denman George DC Jr., et al., 2013) failed to calculate for the following reasons:   The 2013 ASCVD risk score is only valid for ages 34 to 50   A/P: Diabetes longstanding currently uncontrolled but improving. A1c today is 7.3, showing improved glycemic control, however, she is not quite at goal. She cannot tolerate 1000 mg BID of metformin d/t side effects.Patient is able to verbalize appropriate hypoglycemia management plan. Patient is adherent with medication.   -Continue current regimen.  -Extensively discussed pathophysiology of DM, recommended lifestyle interventions, dietary effects on glycemic control -Counseled on s/sx of and management of hypoglycemia -Next A1C anticipated 10/2020.   Written patient instructions provided. Total time in face to face counseling 15 minutes.   Follow up Clinic Visit with Zelda in October.   Butch Penny, PharmD, Patsy [place], CPP Clinical Pharmacist [facility] [phone]

## 2020-08-28 DIAGNOSIS — Z419 Encounter for procedure for purposes other than remedying health state, unspecified: Secondary | ICD-10-CM | POA: Diagnosis not present

## 2020-09-28 DIAGNOSIS — Z419 Encounter for procedure for purposes other than remedying health state, unspecified: Secondary | ICD-10-CM | POA: Diagnosis not present

## 2020-10-23 ENCOUNTER — Other Ambulatory Visit: Payer: Self-pay | Admitting: Nurse Practitioner

## 2020-10-23 ENCOUNTER — Other Ambulatory Visit: Payer: Self-pay

## 2020-10-23 DIAGNOSIS — E1165 Type 2 diabetes mellitus with hyperglycemia: Secondary | ICD-10-CM

## 2020-10-23 MED ORDER — LISINOPRIL 2.5 MG PO TABS
ORAL_TABLET | Freq: Every day | ORAL | 0 refills | Status: DC
  Filled 2020-10-23: qty 90, 90d supply, fill #0

## 2020-10-23 MED ORDER — EMPAGLIFLOZIN 25 MG PO TABS
ORAL_TABLET | ORAL | 0 refills | Status: DC
  Filled 2020-10-23 – 2020-10-24 (×2): qty 90, 90d supply, fill #0

## 2020-10-23 MED FILL — Atorvastatin Calcium Tab 20 MG (Base Equivalent): ORAL | 90 days supply | Qty: 90 | Fill #1 | Status: AC

## 2020-10-23 MED FILL — Glucose Blood Test Strip: 50 days supply | Qty: 100 | Fill #2 | Status: AC

## 2020-10-24 ENCOUNTER — Other Ambulatory Visit: Payer: Self-pay

## 2020-10-28 DIAGNOSIS — Z419 Encounter for procedure for purposes other than remedying health state, unspecified: Secondary | ICD-10-CM | POA: Diagnosis not present

## 2020-11-15 ENCOUNTER — Other Ambulatory Visit: Payer: Self-pay

## 2020-11-15 ENCOUNTER — Ambulatory Visit (INDEPENDENT_AMBULATORY_CARE_PROVIDER_SITE_OTHER): Payer: Medicaid Other | Admitting: Internal Medicine

## 2020-11-15 ENCOUNTER — Encounter: Payer: Self-pay | Admitting: Internal Medicine

## 2020-11-15 VITALS — BP 114/74 | HR 76 | Ht 61.0 in | Wt 142.0 lb

## 2020-11-15 DIAGNOSIS — Z23 Encounter for immunization: Secondary | ICD-10-CM | POA: Diagnosis not present

## 2020-11-15 DIAGNOSIS — E0590 Thyrotoxicosis, unspecified without thyrotoxic crisis or storm: Secondary | ICD-10-CM | POA: Diagnosis not present

## 2020-11-15 LAB — T4, FREE: Free T4: 0.76 ng/dL (ref 0.60–1.60)

## 2020-11-15 LAB — TSH: TSH: 0.7 u[IU]/mL (ref 0.35–5.50)

## 2020-11-15 NOTE — Progress Notes (Signed)
Name: Veronica Love  MRN/ DOB: [id number], [birth year]    Age/ Sex: 35 y.o., female     PCP: Gildardo Pounds, NP   Reason for Endocrinology Evaluation: Low TSH      Initial Endocrinology Clinic Visit: 03/03/2018    PATIENT IDENTIFIER: Veronica Love is a 35 y.o., female with a past medical history of  Dyslipidemia and T2DM.Veronica Love She has followed with [facility] since 03/03/2018 for consultative assistance with management of her low TSH    HISTORICAL SUMMARY:  Pt was noted to have low TSH at 0.232 uIU/mL during routine lab workup in 08/2017. Repeat labs in 11/2017 confirmed similar results.  Thyroid ultrasound in 02/2018 showed no evidence of thyroid nodules.   An order for thyroid uptake and scan was order in 05/2019 but this has not been done as the pt did not know where to go   Methimazole was started 11/2019  SUBJECTIVE:   Today (11/15/2020):  [name] is here for a follow up on subclinical hyperthyroidism. She is accompanied by an interpreter today   Her weight has been fluctuating  Denies abdominal pain or vomiting  Denies palpitations  Denies local neck symptoms   She is on Nexplanon   Methimazole 5 mg daily      HISTORY:  Past Medical History:  Past Medical History:  Diagnosis Date   Diabetes mellitus type 2, uncontrolled, with complications ([place])    Gestational diabetes 2013   History of positive PPD 2009   with negative chest x-ray   No pertinent past medical history    Past Surgical History:  Past Surgical History:  Procedure Laterality Date   NO PAST SURGERIES     Social History:  reports that she has never smoked. She has never used smokeless tobacco. She reports that she does not drink alcohol and does not use drugs. Family History:  Family History  Problem Relation Age of Onset   Diabetes Father      HOME MEDICATIONS: Allergies as of 11/15/2020       Reactions   Metformin And Related    Can not take 1000 mg of metformin as it causes  nausea and headaches.   Victoza [liraglutide] Other (See Comments)   Dizziness        Medication List        Accurate as of November 15, 2020  8:22 AM. If you have any questions, ask your nurse or doctor.          Accu-Chek FastClix Lancets Misc USE AS INSTRUCTED. CHECK BLOOD GLUCOSE LEVEL BY FINGERSTICK TWICE PER DAY.   Accu-Chek Guide test strip Generic drug: glucose blood USE AS INSTRUCTED. CHECK BLOOD GLUCOSE LEVEL BY FINGERSTICK TWICE PER DAY. E11.65   Accu-Chek Guide w/Device Kit 1 each by Does not apply route daily.   atorvastatin 20 MG tablet Commonly known as: LIPITOR TAKE 1 TABLET (20 MG TOTAL) BY MOUTH DAILY AT 6 PM.   Cepacol Sore Throat Ex St 15-3.6 MG Lozg Generic drug: Benzocaine-Menthol Place the lozenge in your mouth between your gums and your cheek. Allow the lozenge to dissolve slowly over 20-30 minutes, moving it around every so often from one side of your mouth to the other. Do not chew, suck, or swallow it.   cetirizine 10 MG tablet Commonly known as: ZYRTEC Take 1 tablet (10 mg total) by mouth daily.   etonogestrel 68 MG Impl implant Commonly known as: Nexplanon Inject 1 each (68 mg total) into  the skin once.   Janumet 50-500 MG tablet Generic drug: sitaGLIPtin-metformin Take 1 tablet in the morning and 2 tablets in the evening.   Jardiance 25 MG Tabs tablet Generic drug: empagliflozin TAKE 1 TABLET (25 MG TOTAL) BY MOUTH DAILY BEFORE BREAKFAST.   lisinopril 2.5 MG tablet Commonly known as: ZESTRIL TAKE 1 TABLET (2.5 MG TOTAL) BY MOUTH DAILY.   methimazole 5 MG tablet Commonly known as: TAPAZOLE Take 1 tablet (5 mg total) by mouth daily.   omeprazole 20 MG capsule Commonly known as: PRILOSEC TAKE 1 CAPSULE (20 MG TOTAL) BY MOUTH 2 (TWO) TIMES DAILY BEFORE A MEAL FOR 7 DAYS.          OBJECTIVE:   PHYSICAL EXAM: VS: BP 114/74 (BP Location: Left Arm, Patient Position: Sitting, Cuff Size: Small)   Pulse 76   Ht _0  (1.549  m)   Wt 142 lb (64.4 kg)   SpO2 99%   BMI 26.83 kg/m     EXAM: General: Pt appears well and is in NAD  Neck: General: Supple without adenopathy. Thyroid: No goiter or nodules appreciated.  Lungs: Clear with good BS bilat with no rales, rhonchi, or wheezes  Heart: Auscultation: RRR.  Abdomen: Normoactive bowel sounds, soft, nontender, without masses or organomegaly palpable  Extremities:  BL LE: No pretibial edema normal ROM and strength.  Mental Status: Judgment, insight: Intact Orientation: Oriented to time, place, and person Mood and affect: No depression, anxiety, or agitation     DATA REVIEWED:  Results for CHRISTASIA, ANGELETTI (MRN [id number]) as of 11/16/2020 10:21  Ref. Range 11/15/2020 10:04  TSH Latest Ref Range: 0.35 - 5.50 uIU/mL 0.70  T4,Free(Direct) Latest Ref Range: 0.60 - 1.60 ng/dL 0.76   Results for Veronica Love (MRN [id number]) as of 06/02/2018 07:55  Ref. Range 03/03/2018 09:10  TRAB Latest Ref Range: <=2.00 IU/L 1.06    Thyroid Ultrasound 03/17/2018 There are no discrete nodules which meet criteria for biopsy nor follow-up. The right lobe is larger than the left.     ASSESSMENT / PLAN / RECOMMENDATIONS:   Subclinical hyperthyroidism :  - She continues to be clinically euthyroid  - We attempted to proceed with thyroid uptake ans scan in 05/2019 but she missed that appointment and opted to treat medically  - I suspect Graves' disease , given detectable but ont elevated TRAB and age of presentation  - TFt's normal, no change    Medication  Continue methimazole 5 mg, 1 tabs daily    F/u in 6 months.     Signed electronically by: Mack Guise, MD  [facility] Endocrinology  [facility] [address], [place], [place] [address] Phone: [phone] FAX: [phone]      CC: Gildardo Pounds, NP [place] [address] Phone: [phone]  Fax: [phone]   Return to Endocrinology clinic as  below: Future Appointments  Date Time Provider [place]  11/15/2020  9:50 AM Samuele Storey, Melanie Crazier, MD [facility] None  11/20/2020  8:30 AM Gildardo Pounds, NP [facility] None

## 2020-11-16 ENCOUNTER — Encounter: Payer: Self-pay | Admitting: Internal Medicine

## 2020-11-16 ENCOUNTER — Other Ambulatory Visit: Payer: Self-pay

## 2020-11-16 MED ORDER — METHIMAZOLE 5 MG PO TABS
5.0000 mg | ORAL_TABLET | Freq: Every day | ORAL | 2 refills | Status: DC
  Filled 2020-11-16 – 2021-04-02 (×2): qty 90, 90d supply, fill #0

## 2020-11-20 ENCOUNTER — Other Ambulatory Visit: Payer: Self-pay

## 2020-11-20 ENCOUNTER — Encounter: Payer: Self-pay | Admitting: Nurse Practitioner

## 2020-11-20 ENCOUNTER — Ambulatory Visit: Payer: Medicaid Other | Attending: Nurse Practitioner | Admitting: Nurse Practitioner

## 2020-11-20 VITALS — BP 109/76 | HR 74 | Ht 61.0 in | Wt 143.4 lb

## 2020-11-20 DIAGNOSIS — E1165 Type 2 diabetes mellitus with hyperglycemia: Secondary | ICD-10-CM | POA: Diagnosis not present

## 2020-11-20 DIAGNOSIS — Z3046 Encounter for surveillance of implantable subdermal contraceptive: Secondary | ICD-10-CM

## 2020-11-20 DIAGNOSIS — Z23 Encounter for immunization: Secondary | ICD-10-CM

## 2020-11-20 LAB — POCT GLYCOSYLATED HEMOGLOBIN (HGB A1C): Hemoglobin A1C: 8 % — AB (ref 4.0–5.6)

## 2020-11-20 LAB — GLUCOSE, POCT (MANUAL RESULT ENTRY): POC Glucose: 150 mg/dl — AB (ref 70–99)

## 2020-11-20 MED ORDER — GLIMEPIRIDE 4 MG PO TABS
4.0000 mg | ORAL_TABLET | Freq: Every day | ORAL | 0 refills | Status: DC
  Filled 2020-11-20: qty 90, 90d supply, fill #0

## 2020-11-20 MED ORDER — GLIMEPIRIDE 2 MG PO TABS
2.0000 mg | ORAL_TABLET | Freq: Every day | ORAL | 3 refills | Status: DC
  Filled 2020-11-20: qty 30, 30d supply, fill #0
  Filled 2020-12-25: qty 30, 30d supply, fill #1
  Filled 2021-01-30: qty 30, 30d supply, fill #2
  Filled 2021-03-02: qty 30, 30d supply, fill #0

## 2020-11-20 MED ORDER — JANUMET 50-500 MG PO TABS: 1.0000 | ORAL_TABLET | Freq: Two times a day (BID) | ORAL | 1 refills | Status: DC

## 2020-11-20 NOTE — Progress Notes (Signed)
Assessment & Plan:  Veronica Love was seen today for diabetes.  Diagnoses and all orders for this visit:  Type 2 diabetes mellitus with hyperglycemia, without long-term current use of insulin (HCC) -     POCT glucose (manual entry) -     POCT glycosylated hemoglobin (Hb A1C) -     CMP14+EGFR   Patient has been counseled on age-appropriate routine health concerns for screening and prevention. These are reviewed and up-to-date. Referrals have been placed accordingly. Immunizations are up-to-date or declined.    Subjective:   Chief Complaint  Patient presents with   Diabetes    Veronica Love 35 y.o. female presents to office today for follow up to DM today.  She has a past medical history of Diabetes mellitus type 2, uncontrolled, with complications, Gestational diabetes (2013), History of positive PPD (2009), and No pertinent past medical history.   [place] interpreter is accompanying her today for interpretation.  Meter average readings as follows:  7 days 134 14 days 143 30 days 156 90 days 157  She needs referral for womens health for nexplanon removal and new nexplanon insertion. Is overdue removal by 2 years.  Followed by Endo for thyroid disorder   DM 2 Not well controlled. Diet includes white rice for breakfast, lunch and dinner. She could not tolerate victoza due to side effects. Currently taking janumet 50-500 1 tablet twice per day (GI upset with increase of metformin dose) and jardiance 25 mg daily. Will add glimepiride 2 mg today due to poorly controlled diabetes. LDL not at goal with atorvastatin 20 mg daily. She is on renal dose ACE.  Lab Results  Component Value Date   HGBA1C 8.0 (A) 11/20/2020    Lab Results  Component Value Date   HGBA1C 7.3 (A) 08/14/2020    Lab Results  Component Value Date   LDLCALC 94 10/05/2019   BP Readings from Last 3 Encounters:  11/20/20 109/76  11/15/20 114/74  07/21/20 111/74     Review of Systems  Constitutional:  Negative  for fever, malaise/fatigue and weight loss.  HENT: Negative.  Negative for nosebleeds.   Eyes: Negative.  Negative for blurred vision, double vision and photophobia.  Respiratory: Negative.  Negative for cough and shortness of breath.   Cardiovascular: Negative.  Negative for chest pain, palpitations and leg swelling.  Gastrointestinal: Negative.  Negative for heartburn, nausea and vomiting.  Musculoskeletal: Negative.  Negative for myalgias.  Neurological: Negative.  Negative for dizziness, focal weakness, seizures and headaches.  Psychiatric/Behavioral: Negative.  Negative for suicidal ideas.    Past Medical History:  Diagnosis Date   Diabetes mellitus type 2, uncontrolled, with complications    Gestational diabetes 2013   History of positive PPD 2009   with negative chest x-ray   No pertinent past medical history     Past Surgical History:  Procedure Laterality Date   NO PAST SURGERIES      Family History  Problem Relation Age of Onset   Diabetes Father     Social History Reviewed with no changes to be made today.   Outpatient Medications Prior to Visit  Medication Sig Dispense Refill   Accu-Chek FastClix Lancets MISC USE AS INSTRUCTED. CHECK BLOOD GLUCOSE LEVEL BY FINGERSTICK TWICE PER DAY. 102 each 12   atorvastatin (LIPITOR) 20 MG tablet TAKE 1 TABLET (20 MG TOTAL) BY MOUTH DAILY AT 6 PM. 90 tablet 3   Blood Glucose Monitoring Suppl (ACCU-CHEK GUIDE) w/Device KIT 1 each by Does not apply route  daily. 1 kit 0   cetirizine (ZYRTEC) 10 MG tablet Take 1 tablet (10 mg total) by mouth daily. 90 tablet 3   empagliflozin (JARDIANCE) 25 MG TABS tablet TAKE 1 TABLET (25 MG TOTAL) BY MOUTH DAILY BEFORE BREAKFAST. 90 tablet 0   etonogestrel (NEXPLANON) 68 MG IMPL implant Inject 1 each (68 mg total) into the skin once. 1 each 0   glucose blood test strip USE AS INSTRUCTED. CHECK BLOOD GLUCOSE LEVEL BY FINGERSTICK TWICE PER DAY. E11.65 100 strip 12   lisinopril (ZESTRIL) 2.5 MG tablet  TAKE 1 TABLET (2.5 MG TOTAL) BY MOUTH DAILY. 90 tablet 0   methimazole (TAPAZOLE) 5 MG tablet Take 1 tablet (5 mg total) by mouth daily. 90 tablet 2   omeprazole (PRILOSEC) 20 MG capsule TAKE 1 CAPSULE (20 MG TOTAL) BY MOUTH 2 (TWO) TIMES DAILY BEFORE A MEAL FOR 7 DAYS. 14 capsule 0   sitaGLIPtin-metformin (JANUMET) 50-500 MG tablet Take 1 tablet in the morning and 2 tablets in the evening. 90 tablet 1   Benzocaine-Menthol (CEPACOL SORE THROAT EX ST) 15-3.6 MG LOZG Place the lozenge in your mouth between your gums and your cheek. Allow the lozenge to dissolve slowly over 20-30 minutes, moving it around every so often from one side of your mouth to the other. Do not chew, suck, or swallow it. 168 lozenge 0   No facility-administered medications prior to visit.    Allergies  Allergen Reactions   Metformin And Related     Can not take 1000 mg of metformin as it causes nausea and headaches.   Victoza [Liraglutide] Other (See Comments)    Dizziness       Objective:    BP 109/76   Pulse 74   Ht '5\' 1"'  (1.549 m)   Wt 143 lb 6 oz (65 kg)   SpO2 100%   BMI 27.09 kg/m  Wt Readings from Last 3 Encounters:  11/20/20 143 lb 6 oz (65 kg)  11/15/20 142 lb (64.4 kg)  07/21/20 141 lb (64 kg)    Physical Exam Vitals and nursing note reviewed.  Constitutional:      Appearance: She is well-developed.  HENT:     Head: Normocephalic and atraumatic.  Cardiovascular:     Rate and Rhythm: Normal rate and regular rhythm.     Heart sounds: Normal heart sounds. No murmur heard.   No friction rub. No gallop.  Pulmonary:     Effort: Pulmonary effort is normal. No tachypnea or respiratory distress.     Breath sounds: Normal breath sounds. No decreased breath sounds, wheezing, rhonchi or rales.  Chest:     Chest wall: No tenderness.  Abdominal:     General: Bowel sounds are normal.     Palpations: Abdomen is soft.  Musculoskeletal:        General: Normal range of motion.     Cervical back: Normal  range of motion.  Skin:    General: Skin is warm and dry.  Neurological:     Mental Status: She is alert and oriented to person, place, and time.     Coordination: Coordination normal.  Psychiatric:        Behavior: Behavior normal. Behavior is cooperative.        Thought Content: Thought content normal.        Judgment: Judgment normal.         Patient has been counseled extensively about nutrition and exercise as well as the importance of adherence with medications  and regular follow-up. The patient was given clear instructions to go to ER or return to medical center if symptoms don't improve, worsen or new problems develop. The patient verbalized understanding.   Follow-up: Return for PAP SMEAR.   Gildardo Pounds, FNP-BC [facility] and [name] [place], [place]   11/20/2020, 8:57 AM

## 2020-11-21 LAB — CMP14+EGFR
ALT: 17 IU/L (ref 0–32)
AST: 18 IU/L (ref 0–40)
Albumin/Globulin Ratio: 1.5 (ref 1.2–2.2)
Albumin: 5 g/dL — ABNORMAL HIGH (ref 3.8–4.8)
Alkaline Phosphatase: 62 IU/L (ref 44–121)
BUN/Creatinine Ratio: 28 — ABNORMAL HIGH (ref 9–23)
BUN: 15 mg/dL (ref 6–20)
Bilirubin Total: 0.2 mg/dL (ref 0.0–1.2)
CO2: 17 mmol/L — ABNORMAL LOW (ref 20–29)
Calcium: 9.5 mg/dL (ref 8.7–10.2)
Chloride: 101 mmol/L (ref 96–106)
Creatinine, Ser: 0.54 mg/dL — ABNORMAL LOW (ref 0.57–1.00)
Globulin, Total: 3.3 g/dL (ref 1.5–4.5)
Glucose: 129 mg/dL — ABNORMAL HIGH (ref 70–99)
Potassium: 4.4 mmol/L (ref 3.5–5.2)
Sodium: 139 mmol/L (ref 134–144)
Total Protein: 8.3 g/dL (ref 6.0–8.5)
eGFR: 122 mL/min/{1.73_m2} (ref >59)

## 2020-11-28 DIAGNOSIS — Z419 Encounter for procedure for purposes other than remedying health state, unspecified: Secondary | ICD-10-CM | POA: Diagnosis not present

## 2020-12-25 ENCOUNTER — Other Ambulatory Visit: Payer: Self-pay

## 2020-12-27 ENCOUNTER — Other Ambulatory Visit: Payer: Self-pay

## 2020-12-28 DIAGNOSIS — Z419 Encounter for procedure for purposes other than remedying health state, unspecified: Secondary | ICD-10-CM | POA: Diagnosis not present

## 2021-01-01 ENCOUNTER — Other Ambulatory Visit: Payer: Self-pay

## 2021-01-01 ENCOUNTER — Ambulatory Visit: Payer: Medicaid Other | Attending: Nurse Practitioner | Admitting: Pharmacist

## 2021-01-01 ENCOUNTER — Encounter: Payer: Self-pay | Admitting: Pharmacist

## 2021-01-01 DIAGNOSIS — E1165 Type 2 diabetes mellitus with hyperglycemia: Secondary | ICD-10-CM

## 2021-01-01 DIAGNOSIS — E785 Hyperlipidemia, unspecified: Secondary | ICD-10-CM

## 2021-01-01 MED ORDER — JANUMET 50-500 MG PO TABS: 1.0000 | ORAL_TABLET | Freq: Two times a day (BID) | ORAL | 3 refills | Status: DC

## 2021-01-01 MED ORDER — EMPAGLIFLOZIN 25 MG PO TABS
ORAL_TABLET | ORAL | 0 refills | Status: DC
  Filled 2021-01-01: qty 90, 90d supply, fill #0

## 2021-01-01 MED ORDER — ATORVASTATIN CALCIUM 20 MG PO TABS
ORAL_TABLET | ORAL | 3 refills | Status: DC
  Filled 2021-01-01 – 2021-07-03 (×2): qty 90, 90d supply, fill #0

## 2021-01-01 MED ORDER — LISINOPRIL 2.5 MG PO TABS
ORAL_TABLET | Freq: Every day | ORAL | 0 refills | Status: DC
  Filled 2021-01-01 – 2021-07-03 (×2): qty 90, 90d supply, fill #0

## 2021-01-01 NOTE — Patient Instructions (Signed)
Thank you for coming to see me today. Please do the following:  Continue all your medications.  Continue checking blood sugars at home. It's really important that you record these and bring these in to your next doctor's appointment. If you get in readings above 500 or lower than 70, call me or the clinic to let your doctor know. See below on how to treat low blood sugar.  Continue making the lifestyle changes we've discussed together during our visit. Diet and exercise play a significant role in improving your blood sugars.  Follow-up with me at the end of January.    Hypoglycemia or low blood sugar:   Low blood sugar can happen quickly and may become an emergency if not treated right away.   While this shouldn't happen often, it can be brought upon if you skip a meal or do not eat enough. Also, if your insulin or other diabetes medications are dosed too high, this can cause your blood sugar to go to low.   Warning signs of low blood sugar include: Feeling shaky or dizzy Feeling weak or tired  Excessive hunger Feeling anxious or upset  Sweating even when you aren't exercising  What to do if I experience low blood sugar? Check your blood sugar with your meter. If lower than 70, proceed to step 2.  Treat with 3-4 glucose tablets or 3 packets of regular sugar. If these aren't around, you can try hard candy. Yet another option would be to drink 4 ounces of fruit juice or 6 ounces of REGULAR soda.  Re-check your sugar in 15 minutes. If it is still below 70, do what you did in step 2 again. If has come back up, go ahead and eat a snack or small meal at this time.

## 2021-01-01 NOTE — Progress Notes (Signed)
S:    PCP: Zelda  No chief complaint on file.  Patient arrives in good spirits.  Presents for diabetes evaluation, education, and management. Patient was referred and last seen by Primary Care Provider on 11/20/2020.   Family/Social History:  - FHx: DM (father) - Alcohol: denies - Tobacco: never smoker  [employer] affordability: [place] Medicaid  Patient reports adherence with medications, however, I checked her fill history in our pharmacy and she is late for several refills including Janumet. She tells me today that she takes her medications but is sometimes late for refills. Additionally, she admits that she takes the lisinopril and atorvastatin for 2-3 days out of the week.  Current diabetes medications include: glimepiride 2mg  daily, Janumet 50-500 mg BID, Jardiance 25 mg daily Current lipid lowering medications: atorvastatin 20 mg daily Current antihypertensive medications: lisinopril 2.5 mg daily   Patient denies hypoglycemic events.  Patient reported dietary habits:  - Admits to regular intake of carbs (noodles, rice, potatoes, bread) - At previous appt with PCP, pt admitted to eating white rice with breakfast, lunch, and dinner. Today she tells me she has not made any changes to her diet.   Patient-reported exercise habits:  - None   Patient denies  nocturia.  Patient denies neuropathy. Patient denies visual changes. Patient denies self foot exams.     O: Lab Results  Component Value Date   HGBA1C 8.0 (A) 11/20/2020   There were no vitals filed for this visit.  Lipid Panel     Component Value Date/Time   CHOL 164 10/05/2019 0948   TRIG 109 10/05/2019 0948   HDL 50 10/05/2019 0948   CHOLHDL 3.3 10/05/2019 0948   CHOLHDL 6.2 (H) 11/30/2014 1053   VLDL 42 (H) 11/30/2014 1053   LDLCALC 94 10/05/2019 0948   Home CBG averages:  7 day: 108 14 day: 115 30 day: 115 90 day: 133 I reviewed a complete log since her last PCP visit. She has 1  outlier of 188 since her last PCP visit. Other than this her highest CBG at home was 143. Lowest was 83. She spends most of her time in the 110s-130s.   Clinical ASCVD: No  The ASCVD Risk score (Arnett DK, et al., 2019) failed to calculate for the following reasons:   The 2019 ASCVD risk score is only valid for ages 44 to 37   A/P: Diabetes longstanding currently uncontrolled based on A1c, however, home CBGs reveal improvement since addition of glimepiride. She cannot tolerate 1000 mg BID of metformin d/t side effects. She has tried and could not tolerate GLP-1 RA therapy. She is already on an SGLT-2i. Will hold off on any additional changes. Patient is able to verbalize appropriate hypoglycemia management plan. Patient is adherent with medication but adherence is suboptimal. Strongly encouraged compliance.  -Continue current regimen.  -Extensively discussed pathophysiology of DM, recommended lifestyle interventions, dietary effects on glycemic control -Counseled on s/sx of and management of hypoglycemia -Next A1C anticipated 01/2021.   ASCVD risk - primary prevention in patient with diabetes. Last LDL is not controlled. ASCVD risk score cannot be calculated d/t age.  She is already on moderate intensity statin but admits to taking this only 2-3 days weekly. Will get updated lipid today. I strongly encouraged compliance. We reviewed micro- and macrovascular complications that can occur with DM and the role of statins to prevent these.   -Continued atorvastatin 20 mg.  -Lipid  Written patient instructions provided. Total time in face to  face counseling 30 minutes.   Follow up Clinic Visit with Zelda later this month.    Butch Penny, PharmD, [place], CPP Clinical Pharmacist [facility] [phone]

## 2021-01-02 LAB — LIPID PANEL
Chol/HDL Ratio: 1.7 ratio (ref 0.0–4.4)
Cholesterol, Total: 239 mg/dL — ABNORMAL HIGH (ref 100–199)
HDL: 144 mg/dL (ref >39)
LDL Chol Calc (NIH): 84 mg/dL (ref 0–99)
Triglycerides: 63 mg/dL (ref 0–149)
VLDL Cholesterol Cal: 11 mg/dL (ref 5–40)

## 2021-01-15 ENCOUNTER — Other Ambulatory Visit: Payer: Self-pay

## 2021-01-17 ENCOUNTER — Other Ambulatory Visit: Payer: Self-pay | Admitting: Family Medicine

## 2021-01-17 ENCOUNTER — Other Ambulatory Visit: Payer: Self-pay

## 2021-01-17 ENCOUNTER — Ambulatory Visit: Payer: Medicaid Other | Admitting: Nurse Practitioner

## 2021-01-17 DIAGNOSIS — E1165 Type 2 diabetes mellitus with hyperglycemia: Secondary | ICD-10-CM

## 2021-01-17 MED ORDER — JANUMET 50-500 MG PO TABS
1.0000 | ORAL_TABLET | Freq: Two times a day (BID) | ORAL | 3 refills | Status: DC
  Filled 2021-01-17 – 2021-03-02 (×2): qty 60, 30d supply, fill #0
  Filled 2021-04-02: qty 60, 30d supply, fill #1
  Filled 2021-05-08: qty 60, 30d supply, fill #2

## 2021-01-20 DIAGNOSIS — Z20822 Contact with and (suspected) exposure to covid-19: Secondary | ICD-10-CM | POA: Diagnosis not present

## 2021-01-20 DIAGNOSIS — J020 Streptococcal pharyngitis: Secondary | ICD-10-CM | POA: Diagnosis not present

## 2021-01-20 DIAGNOSIS — R509 Fever, unspecified: Secondary | ICD-10-CM | POA: Diagnosis not present

## 2021-01-28 DIAGNOSIS — Z419 Encounter for procedure for purposes other than remedying health state, unspecified: Secondary | ICD-10-CM | POA: Diagnosis not present

## 2021-01-30 ENCOUNTER — Other Ambulatory Visit: Payer: Self-pay

## 2021-02-13 ENCOUNTER — Other Ambulatory Visit: Payer: Self-pay

## 2021-02-13 ENCOUNTER — Other Ambulatory Visit: Payer: Self-pay | Admitting: Nurse Practitioner

## 2021-02-13 DIAGNOSIS — E1165 Type 2 diabetes mellitus with hyperglycemia: Secondary | ICD-10-CM

## 2021-02-13 MED ORDER — ACCU-CHEK GUIDE VI STRP
ORAL_STRIP | 12 refills | Status: DC
  Filled 2021-02-13: qty 100, 25d supply, fill #0
  Filled 2021-04-02: qty 100, 25d supply, fill #1
  Filled 2021-07-03: qty 100, 50d supply, fill #2
  Filled 2021-10-25: qty 100, 50d supply, fill #3

## 2021-02-13 MED ORDER — EMPAGLIFLOZIN 25 MG PO TABS
ORAL_TABLET | ORAL | 0 refills | Status: DC
  Filled 2021-02-13: qty 90, 90d supply, fill #0

## 2021-02-13 MED ORDER — ACCU-CHEK FASTCLIX LANCETS MISC
12 refills | Status: AC
  Filled 2021-02-13: qty 102, 25d supply, fill #0
  Filled 2021-04-02: qty 102, 25d supply, fill #1
  Filled 2021-07-03: qty 102, 50d supply, fill #2

## 2021-02-13 NOTE — Telephone Encounter (Signed)
Requested Prescriptions  Pending Prescriptions Disp Refills   Accu-Chek FastClix Lancets MISC 102 each 12    Sig: USE AS INSTRUCTED. CHECK BLOOD GLUCOSE LEVEL BY FINGERSTICK TWICE PER DAY.     Endocrinology: Diabetes - Testing Supplies Passed - 02/13/2021 11:19 AM      Passed - Valid encounter within last 12 months    Recent Outpatient Visits          1 month ago Type 2 diabetes mellitus with hyperglycemia, without long-term current use of insulin [facility])   [place], Veronica Love, Veronica Love   2 months ago Type 2 diabetes mellitus with hyperglycemia, without long-term current use of insulin [facility])   [place], Veronica Love, Veronica Love   6 months ago Type 2 diabetes mellitus with hyperglycemia, without long-term current use of insulin [facility])   [place], Veronica Love, Veronica Love   6 months ago Viral pharyngitis   [place], Veronica Love, Veronica Love   9 months ago Type 2 diabetes mellitus with hyperglycemia, without long-term current use of insulin [facility])   [place], Veronica Love      Future Appointments            In 2 weeks Gildardo Pounds, Veronica Love [place]            glucose blood (ACCU-CHEK GUIDE) test strip 100 strip 12    Sig: USE AS INSTRUCTED. CHECK BLOOD GLUCOSE LEVEL BY FINGERSTICK TWICE PER DAY. E11.65     Endocrinology: Diabetes - Testing Supplies Passed - 02/13/2021 11:19 AM      Passed - Valid encounter within last 12 months    Recent Outpatient Visits          1 month ago Type 2 diabetes mellitus with hyperglycemia, without long-term current use of insulin [facility])   [place], Veronica Love, Veronica Love   2 months ago Type 2 diabetes mellitus with hyperglycemia, without long-term current use of insulin [facility])   [place], Veronica Love, Veronica Love   6 months ago Type 2 diabetes mellitus with hyperglycemia, without long-term current use of insulin [facility])   [place], Veronica Love, Veronica Love   6 months ago Viral pharyngitis   [place], Veronica Love, Veronica Love   9 months ago Type 2 diabetes mellitus with hyperglycemia, without long-term current use of insulin [facility])   [place], Veronica Love      Future Appointments            In 2 weeks Gildardo Pounds, Veronica Love [place]            empagliflozin (JARDIANCE) 25 MG TABS tablet 90 tablet 0    Sig: TAKE 1 TABLET (25 MG TOTAL) BY MOUTH DAILY BEFORE BREAKFAST.     Endocrinology:  Diabetes - SGLT2 Inhibitors Failed - 02/13/2021 11:19 AM      Failed - Cr in normal range and within 360 days    Creat  Date Value Ref Range Status  11/30/2014 0.64 0.50 - 1.10 mg/dL Final   Creatinine, Ser  Date Value Ref Range Status  11/20/2020 0.54 (Love) 0.57 - 1.00 mg/dL Final  Creatinine, Urine  Date Value Ref Range Status  04/24/2015 59 20 - 320 mg/dL Final         Failed - HBA1C is between 0 and 7.9 and within 180 days    Hemoglobin A1C  Date Value Ref Range Status  11/20/2020 8.0 (A) 4.0 - 5.6 % Final   Hgb A1c MFr Bld  Date Value Ref Range Status  08/11/2020 7.5 (H) 4.8 - 5.6 % Final    Comment:             Prediabetes: 5.7 - 6.4          Diabetes: >6.4          Glycemic control for adults with diabetes: <7.0          Passed - LDL in normal range and within 360 days    LDL Chol Calc (NIH)  Date Value Ref Range Status  01/01/2021 84 0 - 99 mg/dL Final         Passed - eGFR in normal range and within 360 days    GFR, Est African American  Date Value Ref Range Status  09/27/2013 >89 mL/min Final   GFR calc Af Amer  Date Value Ref Range Status  02/28/2020 133 >59 mL/min/1.73 Final     Comment:    **In accordance with recommendations from the NKF-ASN Task force,**   [facility] is in the process of updating its eGFR calculation to the   2021 CKD-EPI creatinine equation that estimates kidney function   without a race variable.    GFR, Est Non African American  Date Value Ref Range Status  09/27/2013 >89 mL/min Final    Comment:      The estimated GFR is a calculation valid for adults (>=36 years old) that uses the CKD-EPI algorithm to adjust for age and sex. It is   not to be used for children, pregnant women, hospitalized patients,    patients on dialysis, or with rapidly changing kidney function. According to the NKDEP, eGFR >89 is normal, 60-89 shows mild impairment, 30-59 shows moderate impairment, 15-29 shows severe impairment and <15 is ESRD.     GFR calc non Af Amer  Date Value Ref Range Status  02/28/2020 115 >59 mL/min/1.73 Final   eGFR  Date Value Ref Range Status  11/20/2020 122 >59 mL/min/1.73 Final         Passed - Valid encounter within last 6 months    Recent Outpatient Visits          1 month ago Type 2 diabetes mellitus with hyperglycemia, without long-term current use of insulin [facility])   [place], Veronica Love, Veronica Love   2 months ago Type 2 diabetes mellitus with hyperglycemia, without long-term current use of insulin [facility])   [place], Veronica Love, Veronica Love   6 months ago Type 2 diabetes mellitus with hyperglycemia, without long-term current use of insulin [facility])   [place], Veronica Love, Veronica Love   6 months ago Viral pharyngitis   [place], Veronica Love, Veronica Love   9 months ago Type 2 diabetes mellitus with hyperglycemia, without long-term current use of insulin [facility])   [place], Veronica Love      Future Appointments            In 2 weeks  Gildardo Pounds, Veronica Love [place]  [place]

## 2021-02-14 ENCOUNTER — Other Ambulatory Visit: Payer: Self-pay

## 2021-02-28 DIAGNOSIS — Z419 Encounter for procedure for purposes other than remedying health state, unspecified: Secondary | ICD-10-CM | POA: Diagnosis not present

## 2021-03-02 ENCOUNTER — Other Ambulatory Visit: Payer: Self-pay

## 2021-03-05 ENCOUNTER — Other Ambulatory Visit: Payer: Self-pay

## 2021-03-05 ENCOUNTER — Ambulatory Visit: Payer: Medicaid Other | Attending: Nurse Practitioner | Admitting: Nurse Practitioner

## 2021-03-05 ENCOUNTER — Encounter: Payer: Self-pay | Admitting: Nurse Practitioner

## 2021-03-05 VITALS — BP 116/81 | HR 79 | Ht 61.0 in | Wt 147.5 lb

## 2021-03-05 DIAGNOSIS — D72829 Elevated white blood cell count, unspecified: Secondary | ICD-10-CM | POA: Diagnosis not present

## 2021-03-05 DIAGNOSIS — E1165 Type 2 diabetes mellitus with hyperglycemia: Secondary | ICD-10-CM

## 2021-03-05 DIAGNOSIS — E0590 Thyrotoxicosis, unspecified without thyrotoxic crisis or storm: Secondary | ICD-10-CM | POA: Diagnosis not present

## 2021-03-05 LAB — POCT GLYCOSYLATED HEMOGLOBIN (HGB A1C): Hemoglobin A1C: 6.9 % — AB (ref 4.0–5.6)

## 2021-03-05 LAB — GLUCOSE, POCT (MANUAL RESULT ENTRY): POC Glucose: 109 mg/dl — AB (ref 70–99)

## 2021-03-05 MED ORDER — ACCU-CHEK GUIDE W/DEVICE KIT
1.0000 | PACK | Freq: Every day | 0 refills | Status: DC
  Filled 2021-03-05: qty 1, 30d supply, fill #0

## 2021-03-05 NOTE — Progress Notes (Signed)
Assessment & Plan:  Veronica Love was seen today for diabetes.  Diagnoses and all orders for this visit:  Type 2 diabetes mellitus with hyperglycemia, without long-term current use of insulin (HCC) -     POCT glycosylated hemoglobin (Hb A1C) -     POCT glucose (manual entry) -     CMP14+EGFR -     Blood Glucose Monitoring Suppl (ACCU-CHEK GUIDE) w/Device KIT; 1 each by Does not apply route daily. Continue blood sugar control as discussed in office today, low carbohydrate diet, and regular physical exercise as tolerated, 150 minutes per week (30 min each day, 5 days per week, or 50 min 3 days per week). Keep blood sugar logs with fasting goal of 90-130 mg/dl, post prandial (after you eat) less than 180.  For Hypoglycemia: BS <60 and Hyperglycemia BS >400; contact the clinic ASAP. Annual eye exams and foot exams are recommended.   Leukocytosis, unspecified type -     CBC  Hyperthyroidism -     Thyroid Panel With TSH    Patient has been counseled on age-appropriate routine health concerns for screening and prevention. These are reviewed and up-to-date. Referrals have been placed accordingly. Immunizations are up-to-date or declined.    Subjective:   Chief Complaint  Patient presents with   Diabetes   HPI Veronica Love 36 y.o. female presents to office today for follow-up to diabetes.  Her thyroid disease is being managed by endocrinology  DM 2 Meter readings are as follows: 7 day average 115 14 day average 123 30 day average 127 90 day average 118 Diabetes is well controlled with glimepiride 2 mg daily, Janumet 50-500 mg twice daily and Jardiance 25 mg daily.  She has noted that her Accu-Chek device is not working properly as she has had it for a few years we will order a new device today.  LDL not quite at goal however she endorses adherence taking atorvastatin 20 mg daily. Lab Results  Component Value Date   HGBA1C 6.9 (A) 03/05/2021    Lab Results  Component Value Date   LDLCALC 84  01/01/2021    Thyroid disorder She denies any symptoms of hyper or hypothyroidism.  Currently taking Tapazole 5 mg daily as prescribed   Review of Systems  Constitutional:  Negative for fever, malaise/fatigue and weight loss.  HENT: Negative.  Negative for nosebleeds.   Eyes: Negative.  Negative for blurred vision, double vision and photophobia.  Respiratory: Negative.  Negative for cough and shortness of breath.   Cardiovascular: Negative.  Negative for chest pain, palpitations and leg swelling.  Gastrointestinal: Negative.  Negative for heartburn, nausea and vomiting.  Musculoskeletal: Negative.  Negative for myalgias.  Neurological: Negative.  Negative for dizziness, focal weakness, seizures and headaches.  Psychiatric/Behavioral: Negative.  Negative for suicidal ideas.    Past Medical History:  Diagnosis Date   Diabetes mellitus type 2, uncontrolled, with complications    Gestational diabetes 2013   History of positive PPD 2009   with negative chest x-ray   No pertinent past medical history     Past Surgical History:  Procedure Laterality Date   NO PAST SURGERIES      Family History  Problem Relation Age of Onset   Diabetes Father     Social History Reviewed with no changes to be made today.   Outpatient Medications Prior to Visit  Medication Sig Dispense Refill   Accu-Chek FastClix Lancets MISC USE AS INSTRUCTED. CHECK BLOOD GLUCOSE LEVEL BY FINGERSTICK TWICE PER  DAY. 102 each 12   atorvastatin (LIPITOR) 20 MG tablet TAKE 1 TABLET (20 MG TOTAL) BY MOUTH DAILY AT 6 PM. 90 tablet 3   cetirizine (ZYRTEC) 10 MG tablet Take 1 tablet (10 mg total) by mouth daily. 90 tablet 3   empagliflozin (JARDIANCE) 25 MG TABS tablet TAKE 1 TABLET (25 MG TOTAL) BY MOUTH DAILY BEFORE BREAKFAST. 90 tablet 0   etonogestrel (NEXPLANON) 68 MG IMPL implant Inject 1 each (68 mg total) into the skin once. 1 each 0   glimepiride (AMARYL) 2 MG tablet Take 1 tablet (2 mg total) by mouth daily  before breakfast. 30 tablet 3   glucose blood (ACCU-CHEK GUIDE) test strip USE AS INSTRUCTED. CHECK BLOOD GLUCOSE LEVEL BY FINGERSTICK TWICE PER DAY. E11.65 100 strip 12   lisinopril (ZESTRIL) 2.5 MG tablet TAKE 1 TABLET (2.5 MG TOTAL) BY MOUTH DAILY. 90 tablet 0   methimazole (TAPAZOLE) 5 MG tablet Take 1 tablet (5 mg total) by mouth daily. 90 tablet 2   sitaGLIPtin-metformin (JANUMET) 50-500 MG tablet Take 1 tablet by mouth 2 (two) times daily with a meal. 60 tablet 3   Blood Glucose Monitoring Suppl (ACCU-CHEK GUIDE) w/Device KIT 1 each by Does not apply route daily. 1 kit 0   empagliflozin (JARDIANCE) 25 MG TABS tablet TAKE 1 TABLET (25 MG TOTAL) BY MOUTH DAILY BEFORE BREAKFAST. 90 tablet 0   omeprazole (PRILOSEC) 20 MG capsule TAKE 1 CAPSULE (20 MG TOTAL) BY MOUTH 2 (TWO) TIMES DAILY BEFORE A MEAL FOR 7 DAYS. 14 capsule 0   No facility-administered medications prior to visit.    Allergies  Allergen Reactions   Metformin And Related     Can not take 1000 mg of metformin as it causes nausea and headaches.   Victoza [Liraglutide] Other (See Comments)    Dizziness       Objective:    BP 116/81    Pulse 79    Ht _0  (1.549 m)    Wt 147 lb 8 oz (66.9 kg)    SpO2 100%    BMI 27.87 kg/m  Wt Readings from Last 3 Encounters:  03/05/21 147 lb 8 oz (66.9 kg)  11/20/20 143 lb 6 oz (65 kg)  11/15/20 142 lb (64.4 kg)    Physical Exam       Patient has been counseled extensively about nutrition and exercise as well as the importance of adherence with medications and regular follow-up. The patient was given clear instructions to go to ER or return to medical center if symptoms don't improve, worsen or new problems develop. The patient verbalized understanding.   Follow-up: Return for PAP SMEAR.   Gildardo Pounds, FNP-BC [facility] and [place], [place]   03/05/2021, 1:43 PM

## 2021-03-06 DIAGNOSIS — Z3046 Encounter for surveillance of implantable subdermal contraceptive: Secondary | ICD-10-CM | POA: Diagnosis not present

## 2021-03-06 LAB — CBC
Hematocrit: 44.5 % (ref 34.0–46.6)
Hemoglobin: 14.5 g/dL (ref 11.1–15.9)
MCH: 24.5 pg — ABNORMAL LOW (ref 26.6–33.0)
MCHC: 32.6 g/dL (ref 31.5–35.7)
MCV: 75 fL — ABNORMAL LOW (ref 79–97)
Platelets: 291 10*3/uL (ref 150–450)
RBC: 5.92 x10E6/uL — ABNORMAL HIGH (ref 3.77–5.28)
RDW: 14.9 % (ref 11.7–15.4)
WBC: 9 10*3/uL (ref 3.4–10.8)

## 2021-03-06 LAB — CMP14+EGFR
ALT: 18 IU/L (ref 0–32)
AST: 14 IU/L (ref 0–40)
Albumin/Globulin Ratio: 1.3 (ref 1.2–2.2)
Albumin: 4.8 g/dL (ref 3.8–4.8)
Alkaline Phosphatase: 60 IU/L (ref 44–121)
BUN/Creatinine Ratio: 19 (ref 9–23)
BUN: 13 mg/dL (ref 6–20)
Bilirubin Total: 0.2 mg/dL (ref 0.0–1.2)
CO2: 22 mmol/L (ref 20–29)
Calcium: 9.7 mg/dL (ref 8.7–10.2)
Chloride: 105 mmol/L (ref 96–106)
Creatinine, Ser: 0.68 mg/dL (ref 0.57–1.00)
Globulin, Total: 3.6 g/dL (ref 1.5–4.5)
Glucose: 90 mg/dL (ref 70–99)
Potassium: 4.1 mmol/L (ref 3.5–5.2)
Sodium: 142 mmol/L (ref 134–144)
Total Protein: 8.4 g/dL (ref 6.0–8.5)
eGFR: 115 mL/min/{1.73_m2} (ref >59)

## 2021-03-07 ENCOUNTER — Other Ambulatory Visit: Payer: Self-pay

## 2021-03-08 ENCOUNTER — Telehealth: Payer: Self-pay

## 2021-03-08 NOTE — Telephone Encounter (Signed)
I have been unable to reach this patient by phone.  A letter is being sent to the last known home address.

## 2021-03-08 NOTE — Telephone Encounter (Signed)
-----   Message from Zelda W Fleming, NP sent at 03/07/2021  8:41 AM EST ----- °Will need to send letter for lab results if unable to reach. Just copy paste lab result comments below to letter.  ° ° ° ° ° °CBC does not indicate any significant anemia or bleeding disorders however she should take a multivitamin daily.  ° °Kidney, liver function and electrolytes are normal.   °

## 2021-03-09 ENCOUNTER — Telehealth: Payer: Self-pay

## 2021-03-09 NOTE — Telephone Encounter (Signed)
-----   Message from Claiborne Rigg, NP sent at 03/07/2021  8:41 AM EST ----- Will need to send letter for lab results if unable to reach. Just copy paste lab result comments below to letter.       CBC does not indicate any significant anemia or bleeding disorders however she should take a multivitamin daily.   Kidney, liver function and electrolytes are normal.

## 2021-03-12 NOTE — Telephone Encounter (Signed)
I have been unable to reach this patient by phone.  A letter is being sent to the last known home address.

## 2021-03-28 DIAGNOSIS — Z419 Encounter for procedure for purposes other than remedying health state, unspecified: Secondary | ICD-10-CM | POA: Diagnosis not present

## 2021-04-02 ENCOUNTER — Other Ambulatory Visit: Payer: Self-pay | Admitting: Nurse Practitioner

## 2021-04-02 ENCOUNTER — Other Ambulatory Visit: Payer: Self-pay

## 2021-04-03 ENCOUNTER — Other Ambulatory Visit: Payer: Self-pay

## 2021-04-03 MED ORDER — GLIMEPIRIDE 2 MG PO TABS
2.0000 mg | ORAL_TABLET | Freq: Every day | ORAL | 3 refills | Status: DC
  Filled 2021-04-03: qty 30, 30d supply, fill #0
  Filled 2021-05-02: qty 30, 30d supply, fill #1
  Filled 2021-06-07 (×3): qty 30, 30d supply, fill #2
  Filled 2021-07-03: qty 30, 30d supply, fill #3

## 2021-04-03 NOTE — Telephone Encounter (Signed)
Requested Prescriptions  ?Pending Prescriptions Disp Refills  ?? glimepiride (AMARYL) 2 MG tablet 30 tablet 3  ?  Sig: Take 1 tablet (2 mg total) by mouth daily before breakfast.  ?  ? Endocrinology:  Diabetes - Sulfonylureas Passed - 04/02/2021  8:30 AM  ?  ?  Passed - HBA1C is between 0 and 7.9 and within 180 days  ?  Hemoglobin A1C  ?Date Value Ref Range Status  ?03/05/2021 6.9 (A) 4.0 - 5.6 % Final  ? ?Hgb A1c MFr Bld  ?Date Value Ref Range Status  ?08/11/2020 7.5 (H) 4.8 - 5.6 % Final  ?  Comment:  ?           Prediabetes: 5.7 - 6.4 ?         Diabetes: >6.4 ?         Glycemic control for adults with diabetes: <7.0 ?  ?   ?  ?  Passed - Cr in normal range and within 360 days  ?  Creat  ?Date Value Ref Range Status  ?11/30/2014 0.64 0.50 - 1.10 mg/dL Final  ? ?Creatinine, Ser  ?Date Value Ref Range Status  ?03/05/2021 0.68 0.57 - 1.00 mg/dL Final  ? ?Creatinine, Urine  ?Date Value Ref Range Status  ?04/24/2015 59 20 - 320 mg/dL Final  ?   ?  ?  Passed - Valid encounter within last 6 months  ?  Recent Outpatient Visits   ?      ? 4 weeks ago Type 2 diabetes mellitus with hyperglycemia, without long-term current use of insulin (HCC)  ? [facility] [place], [place], NP  ? 3 months ago Type 2 diabetes mellitus with hyperglycemia, without long-term current use of insulin (HCC)  ? [facility] [place], Cornelius Moras, RPH-CPP  ? 4 months ago Type 2 diabetes mellitus with hyperglycemia, without long-term current use of insulin (HCC)  ? [facility] [place], [place], NP  ? 7 months ago Type 2 diabetes mellitus with hyperglycemia, without long-term current use of insulin (HCC)  ? [facility] [place] Huxley, Cornelius Moras, RPH-CPP  ? 8 months ago Viral pharyngitis  ? [facility] [place], Shea Stakes, NP  ?  ?  ? ?  ?  ?  ? ?

## 2021-04-04 ENCOUNTER — Other Ambulatory Visit: Payer: Self-pay

## 2021-04-28 DIAGNOSIS — Z419 Encounter for procedure for purposes other than remedying health state, unspecified: Secondary | ICD-10-CM | POA: Diagnosis not present

## 2021-05-02 ENCOUNTER — Other Ambulatory Visit: Payer: Self-pay

## 2021-05-04 ENCOUNTER — Other Ambulatory Visit: Payer: Self-pay

## 2021-05-04 ENCOUNTER — Ambulatory Visit: Payer: Medicaid Other | Admitting: Nurse Practitioner

## 2021-05-08 ENCOUNTER — Other Ambulatory Visit: Payer: Self-pay

## 2021-05-08 ENCOUNTER — Other Ambulatory Visit: Payer: Self-pay | Admitting: Nurse Practitioner

## 2021-05-08 DIAGNOSIS — E1165 Type 2 diabetes mellitus with hyperglycemia: Secondary | ICD-10-CM

## 2021-05-09 ENCOUNTER — Other Ambulatory Visit: Payer: Self-pay

## 2021-05-09 MED ORDER — EMPAGLIFLOZIN 25 MG PO TABS
ORAL_TABLET | ORAL | 0 refills | Status: DC
  Filled 2021-05-09: qty 90, 90d supply, fill #0

## 2021-05-16 ENCOUNTER — Encounter: Payer: Self-pay | Admitting: Internal Medicine

## 2021-05-16 ENCOUNTER — Ambulatory Visit (INDEPENDENT_AMBULATORY_CARE_PROVIDER_SITE_OTHER): Payer: Medicaid Other | Admitting: Internal Medicine

## 2021-05-16 ENCOUNTER — Other Ambulatory Visit: Payer: Self-pay

## 2021-05-16 VITALS — BP 104/72 | HR 88 | Ht 61.0 in | Wt 147.0 lb

## 2021-05-16 DIAGNOSIS — E0590 Thyrotoxicosis, unspecified without thyrotoxic crisis or storm: Secondary | ICD-10-CM | POA: Diagnosis not present

## 2021-05-16 LAB — TSH: TSH: 1.09 u[IU]/mL (ref 0.35–5.50)

## 2021-05-16 LAB — T4, FREE: Free T4: 0.9 ng/dL (ref 0.60–1.60)

## 2021-05-16 MED ORDER — METHIMAZOLE 5 MG PO TABS
5.0000 mg | ORAL_TABLET | Freq: Every day | ORAL | 3 refills | Status: DC
  Filled 2021-05-16 – 2021-10-24 (×2): qty 90, 90d supply, fill #0
  Filled 2022-02-20: qty 90, 90d supply, fill #1

## 2021-05-16 NOTE — Progress Notes (Signed)
? ?Name: Veronica Love  ?MRN/ DOB: [id number], 10-04-1985    ?Age/ Sex: 36 y.o., female   ? ? ?PCP: Gildardo Pounds, NP   ?Reason for Endocrinology Evaluation: Low TSH   ?   ?Initial Endocrinology Clinic Visit: 03/03/2018  ? ? ?PATIENT IDENTIFIER: Veronica Love is a 36 y.o., female with a past medical history of  Dyslipidemia and T2DM.Marland Kitchen She has followed with [facility] since 03/03/2018 for consultative assistance with management of her low TSH   ? ?HISTORICAL SUMMARY:  ?Pt was noted to have low TSH at 0.232 uIU/mL during routine lab workup in 08/2017. Repeat labs in 11/2017 confirmed similar results.  ?Thyroid ultrasound in 02/2018 showed no evidence of thyroid nodules.  ? ?An order for thyroid uptake and scan was order in 05/2019 but this has not been done as the pt did not know where to go  ? ?Methimazole was started 11/2019 ? ?SUBJECTIVE:  ? ?Today (05/16/2021):  Veronica Love is here for a follow up on subclinical hyperthyroidism.  ? ?Her weight continues to fluctuate  ?Denies abdominal pain or vomiting  ?Denies palpitations  ?Denies local neck symptoms  ? ?She is on Nexplanon  ? ?Methimazole 5 mg daily  ? ? ? ? ?HISTORY:  ?Past Medical History:  ?Past Medical History:  ?Diagnosis Date  ? Diabetes mellitus type 2, uncontrolled, with complications   ? Gestational diabetes 2013  ? History of positive PPD 2009  ? with negative chest x-ray  ? No pertinent past medical history   ? ?Past Surgical History:  ?Past Surgical History:  ?Procedure Laterality Date  ? NO PAST SURGERIES    ? ?Social History:  reports that she has never smoked. She has never used smokeless tobacco. She reports that she does not drink alcohol and does not use drugs. ?Family History:  ?Family History  ?Problem Relation Age of Onset  ? Diabetes Father   ? ? ? ?HOME MEDICATIONS: ?Allergies as of 05/16/2021   ? ?   Reactions  ? Metformin And Related   ? Can not take 1000 mg of metformin as it causes nausea and headaches.  ? Victoza [liraglutide]  Other (See Comments)  ? Dizziness  ? ?  ? ?  ?Medication List  ?  ? ?  ? Accurate as of May 16, 2021  9:52 AM. If you have any questions, ask your nurse or doctor.  ?  ?  ? ?  ? ?Accu-Chek FastClix Lancets Misc ?USE AS INSTRUCTED. CHECK BLOOD GLUCOSE LEVEL BY FINGERSTICK TWICE PER DAY. ?  ?Accu-Chek Guide test strip ?Generic drug: glucose blood ?USE AS INSTRUCTED. CHECK BLOOD GLUCOSE LEVEL BY FINGERSTICK TWICE PER DAY. E11.65 ?  ?Accu-Chek Guide w/Device Kit ?1 each by Does not apply route daily. ?  ?atorvastatin 20 MG tablet ?Commonly known as: LIPITOR ?TAKE 1 TABLET (20 MG TOTAL) BY MOUTH DAILY AT 6 PM. ?  ?cetirizine 10 MG tablet ?Commonly known as: ZYRTEC ?Take 1 tablet (10 mg total) by mouth daily. ?  ?etonogestrel 68 MG Impl implant ?Commonly known as: Nexplanon ?Inject 1 each (68 mg total) into the skin once. ?  ?glimepiride 2 MG tablet ?Commonly known as: AMARYL ?Take 1 tablet (2 mg total) by mouth daily before breakfast. ?  ?Janumet 50-500 MG tablet ?Generic drug: sitaGLIPtin-metformin ?Take 1 tablet by mouth 2 (two) times daily with a meal. ?  ?Jardiance 25 MG Tabs tablet ?Generic drug: empagliflozin ?TAKE 1 TABLET (25 MG TOTAL) BY MOUTH DAILY  BEFORE BREAKFAST. ?  ?lisinopril 2.5 MG tablet ?Commonly known as: ZESTRIL ?TAKE 1 TABLET (2.5 MG TOTAL) BY MOUTH DAILY. ?  ?methimazole 5 MG tablet ?Commonly known as: TAPAZOLE ?Take 1 tablet (5 mg total) by mouth daily. ?  ?omeprazole 20 MG capsule ?Commonly known as: PRILOSEC ?TAKE 1 CAPSULE (20 MG TOTAL) BY MOUTH 2 (TWO) TIMES DAILY BEFORE A MEAL FOR 7 DAYS. ?  ? ?  ? ? ? ? ?OBJECTIVE:  ? ?PHYSICAL EXAM: ?VS: BP 104/72 (BP Location: Left Arm, Patient Position: Sitting, Cuff Size: Large)   Pulse 88   Ht '5\' 1"'  (1.549 m)   Wt 147 lb (66.7 kg)   SpO2 97%   BMI 27.78 kg/m?  ? ? ? ?EXAM: ?General: Pt appears well and is in NAD  ?Neck: General: Supple without adenopathy. ?Thyroid: No goiter or nodules appreciated.  ?Lungs: Clear with good BS bilat with no rales,  rhonchi, or wheezes  ?Heart: Auscultation: RRR.  ?Abdomen: Normoactive bowel sounds, soft, nontender, without masses or organomegaly palpable  ?Extremities:  ?BL LE: No pretibial edema normal ROM and strength.  ?Mental Status: Judgment, insight: Intact ?Orientation: Oriented to time, place, and person ?Mood and affect: No depression, anxiety, or agitation  ? ? ? ?DATA REVIEWED: ? Latest Reference Range & Units 05/16/21 09:57  ?TSH 0.35 - 5.50 uIU/mL 1.09  ?T4,Free(Direct) 0.60 - 1.60 ng/dL 0.90  ? ? ?Results for Veronica Love (MRN [id number]) as of 06/02/2018 07:55 ? Ref. Range 03/03/2018 09:10  ?TRAB Latest Ref Range: <=2.00 IU/L 1.06  ? ? ?Thyroid Ultrasound 03/17/2018 ?There are no discrete nodules which meet criteria for biopsy nor ?follow-up. The right lobe is larger than the left. ?  ? ? ?ASSESSMENT / PLAN / RECOMMENDATIONS:  ? ?Subclinical hyperthyroidism : ? ?- She continues to be clinically euthyroid  ?- I suspect Graves' disease , given detectable but ont elevated TRAB and age of presentation  ?- TFt's normal, no change  ? ? ?Medication  ?Continue methimazole 5 mg, 1 tabs daily  ? ? ?F/u in 1 yr  ? ? ? ?Signed electronically by: ?Abby Nena Jordan, MD ? ?[facility]  ?[facility] ?[address] ?[place], [place] [address] ?Phone: [phone] ?FAX: [phone]  ? ? ? ? ?CC: ?Gildardo Pounds, NP ?[address] ?[place] [address] ?Phone: [phone]  ?Fax: [phone] ? ? ?Return to Endocrinology clinic as below: ?No future appointments. ?  ? ?

## 2021-05-17 LAB — T3: T3, Total: 122 ng/dL (ref 76–181)

## 2021-05-28 DIAGNOSIS — Z419 Encounter for procedure for purposes other than remedying health state, unspecified: Secondary | ICD-10-CM | POA: Diagnosis not present

## 2021-06-07 ENCOUNTER — Other Ambulatory Visit: Payer: Self-pay | Admitting: Family Medicine

## 2021-06-07 ENCOUNTER — Other Ambulatory Visit: Payer: Self-pay

## 2021-06-07 DIAGNOSIS — E1165 Type 2 diabetes mellitus with hyperglycemia: Secondary | ICD-10-CM

## 2021-06-07 MED ORDER — JANUMET 50-500 MG PO TABS
1.0000 | ORAL_TABLET | Freq: Two times a day (BID) | ORAL | 3 refills | Status: DC
  Filled 2021-06-07: qty 60, 30d supply, fill #0
  Filled 2021-07-17: qty 60, 30d supply, fill #1

## 2021-06-07 NOTE — Telephone Encounter (Signed)
Luke could you refill if appropriate  

## 2021-06-08 ENCOUNTER — Other Ambulatory Visit: Payer: Self-pay

## 2021-06-28 DIAGNOSIS — Z419 Encounter for procedure for purposes other than remedying health state, unspecified: Secondary | ICD-10-CM | POA: Diagnosis not present

## 2021-07-03 ENCOUNTER — Other Ambulatory Visit: Payer: Self-pay

## 2021-07-11 ENCOUNTER — Encounter: Payer: Self-pay | Admitting: Nurse Practitioner

## 2021-07-11 DIAGNOSIS — H1045 Other chronic allergic conjunctivitis: Secondary | ICD-10-CM | POA: Diagnosis not present

## 2021-07-11 DIAGNOSIS — H5213 Myopia, bilateral: Secondary | ICD-10-CM | POA: Diagnosis not present

## 2021-07-11 DIAGNOSIS — H40013 Open angle with borderline findings, low risk, bilateral: Secondary | ICD-10-CM | POA: Diagnosis not present

## 2021-07-11 DIAGNOSIS — H11153 Pinguecula, bilateral: Secondary | ICD-10-CM | POA: Diagnosis not present

## 2021-07-11 DIAGNOSIS — E119 Type 2 diabetes mellitus without complications: Secondary | ICD-10-CM | POA: Diagnosis not present

## 2021-07-11 LAB — HM DIABETES EYE EXAM

## 2021-07-17 ENCOUNTER — Other Ambulatory Visit: Payer: Self-pay

## 2021-07-23 DIAGNOSIS — H5213 Myopia, bilateral: Secondary | ICD-10-CM | POA: Diagnosis not present

## 2021-07-28 DIAGNOSIS — Z419 Encounter for procedure for purposes other than remedying health state, unspecified: Secondary | ICD-10-CM | POA: Diagnosis not present

## 2021-08-09 ENCOUNTER — Other Ambulatory Visit: Payer: Self-pay | Admitting: Nurse Practitioner

## 2021-08-09 ENCOUNTER — Other Ambulatory Visit: Payer: Self-pay

## 2021-08-09 MED ORDER — GLIMEPIRIDE 2 MG PO TABS
2.0000 mg | ORAL_TABLET | Freq: Every day | ORAL | 0 refills | Status: DC
Start: 2021-08-09 — End: 2021-08-15
  Filled 2021-08-09: qty 30, 30d supply, fill #0

## 2021-08-10 ENCOUNTER — Other Ambulatory Visit: Payer: Self-pay

## 2021-08-15 ENCOUNTER — Other Ambulatory Visit (HOSPITAL_COMMUNITY)
Admission: RE | Admit: 2021-08-15 | Discharge: 2021-08-15 | Disposition: A | Discharge status: Home / Self Care | Payer: Medicaid Other | Source: Ambulatory Visit | Attending: Nurse Practitioner | Admitting: Nurse Practitioner

## 2021-08-15 ENCOUNTER — Encounter: Payer: Self-pay | Admitting: Nurse Practitioner

## 2021-08-15 ENCOUNTER — Other Ambulatory Visit: Payer: Self-pay

## 2021-08-15 ENCOUNTER — Ambulatory Visit: Payer: Medicaid Other | Attending: Nurse Practitioner | Admitting: Nurse Practitioner

## 2021-08-15 VITALS — BP 111/76 | HR 82 | Wt 148.4 lb

## 2021-08-15 DIAGNOSIS — E7800 Pure hypercholesterolemia, unspecified: Secondary | ICD-10-CM | POA: Diagnosis not present

## 2021-08-15 DIAGNOSIS — Z124 Encounter for screening for malignant neoplasm of cervix: Secondary | ICD-10-CM | POA: Diagnosis not present

## 2021-08-15 DIAGNOSIS — J301 Allergic rhinitis due to pollen: Secondary | ICD-10-CM | POA: Diagnosis not present

## 2021-08-15 DIAGNOSIS — E1165 Type 2 diabetes mellitus with hyperglycemia: Secondary | ICD-10-CM

## 2021-08-15 MED ORDER — EMPAGLIFLOZIN 25 MG PO TABS
ORAL_TABLET | ORAL | 1 refills | Status: DC
  Filled 2021-08-15: qty 90, 90d supply, fill #0

## 2021-08-15 MED ORDER — GLIMEPIRIDE 2 MG PO TABS
2.0000 mg | ORAL_TABLET | Freq: Every day | ORAL | 1 refills | Status: DC
  Filled 2021-08-15 – 2021-09-11 (×2): qty 90, 90d supply, fill #0

## 2021-08-15 MED ORDER — CETIRIZINE HCL 10 MG PO TABS
10.0000 mg | ORAL_TABLET | Freq: Every day | ORAL | 3 refills | Status: DC
  Filled 2021-08-15: qty 30, 30d supply, fill #0

## 2021-08-15 MED ORDER — LISINOPRIL 2.5 MG PO TABS
ORAL_TABLET | Freq: Every day | ORAL | 1 refills | Status: DC
  Filled 2021-08-15: qty 90, fill #0

## 2021-08-15 MED ORDER — ATORVASTATIN CALCIUM 20 MG PO TABS
ORAL_TABLET | ORAL | 3 refills | Status: DC
  Filled 2021-08-15: qty 90, fill #0

## 2021-08-15 MED ORDER — JANUMET 50-500 MG PO TABS
1.0000 | ORAL_TABLET | Freq: Two times a day (BID) | ORAL | 1 refills | Status: DC
  Filled 2021-08-15: qty 180, 90d supply, fill #0

## 2021-08-15 NOTE — Progress Notes (Signed)
Assessment & Plan:  Pamella was seen today for gynecologic exam and medication refill.  Diagnoses and all orders for this visit:  Encounter for Papanicolaou smear for cervical cancer screening -     Cytology - PAP -     Cervicovaginal ancillary only  Type 2 diabetes mellitus with hyperglycemia, without long-term current use of insulin (HCC) -     Hemoglobin A1c -     empagliflozin (JARDIANCE) 25 MG TABS tablet; TAKE 1 TABLET (25 MG TOTAL) BY MOUTH DAILY BEFORE BREAKFAST. -     lisinopril (ZESTRIL) 2.5 MG tablet; TAKE 1 TABLET (2.5 MG TOTAL) BY MOUTH DAILY. -     sitaGLIPtin-metformin (JANUMET) 50-500 MG tablet; Take 1 tablet by mouth 2 (two) times daily with a meal. -     Basic metabolic panel  Hypercholesteremia -     Lipid panel -     atorvastatin (LIPITOR) 20 MG tablet; TAKE 1 TABLET (20 MG TOTAL) BY MOUTH DAILY AT 6 PM.  Seasonal allergic rhinitis due to pollen -     cetirizine (ZYRTEC) 10 MG tablet; Take 1 tablet (10 mg total) by mouth daily.  Other orders -     glimepiride (AMARYL) 2 MG tablet; Take 1 tablet (2 mg total) by mouth daily before breakfast.    Patient has been counseled on age-appropriate routine health concerns for screening and prevention. These are reviewed and up-to-date. Referrals have been placed accordingly. Immunizations are up-to-date or declined.    Subjective:   Chief Complaint  Patient presents with   Gynecologic Exam   Medication Refill   HPI MERION CATON 36 y.o. female presents to office today for pap smear.   She has a past medical history of Diabetes mellitus, type 2, Gestational diabetes (2013), History of positive PPD (2009), Hypercholesteremia, Subclinical hyperthyroidism.   She is followed by Endocrinology for her thyroid disorder  Review of Systems  Constitutional: Negative.  Negative for chills, fever, malaise/fatigue and weight loss.  Respiratory: Negative.  Negative for cough, shortness of breath and wheezing.   Cardiovascular:  Negative.  Negative for chest pain, orthopnea and leg swelling.  Gastrointestinal:  Negative for abdominal pain.  Genitourinary: Negative.  Negative for flank pain.  Skin: Negative.  Negative for rash.  Psychiatric/Behavioral:  Negative for suicidal ideas.     Past Medical History:  Diagnosis Date   Diabetes mellitus, type 2 ([place])    Gestational diabetes 2013   History of positive PPD 2009   with negative chest x-ray   Hypercholesteremia    No pertinent past medical history    Subclinical hyperthyroidism     Past Surgical History:  Procedure Laterality Date   NO PAST SURGERIES      Family History  Problem Relation Age of Onset   Diabetes Father     Social History Reviewed with no changes to be made today.   Outpatient Medications Prior to Visit  Medication Sig Dispense Refill   Accu-Chek FastClix Lancets MISC USE AS INSTRUCTED. CHECK BLOOD GLUCOSE LEVEL BY FINGERSTICK TWICE PER DAY. 102 each 12   Blood Glucose Monitoring Suppl (ACCU-CHEK GUIDE) w/Device KIT 1 each by Does not apply route daily. 1 kit 0   etonogestrel (NEXPLANON) 68 MG IMPL implant Inject 1 each (68 mg total) into the skin once. 1 each 0   glucose blood (ACCU-CHEK GUIDE) test strip USE AS INSTRUCTED. CHECK BLOOD GLUCOSE LEVEL BY FINGERSTICK TWICE PER DAY. E11.65 100 strip 12   methimazole (TAPAZOLE) 5 MG tablet Take  1 tablet (5 mg total) by mouth daily. 90 tablet 3   atorvastatin (LIPITOR) 20 MG tablet TAKE 1 TABLET (20 MG TOTAL) BY MOUTH DAILY AT 6 PM. 90 tablet 3   cetirizine (ZYRTEC) 10 MG tablet Take 1 tablet (10 mg total) by mouth daily. 90 tablet 3   empagliflozin (JARDIANCE) 25 MG TABS tablet TAKE 1 TABLET (25 MG TOTAL) BY MOUTH DAILY BEFORE BREAKFAST. 90 tablet 0   glimepiride (AMARYL) 2 MG tablet Take 1 tablet (2 mg total) by mouth daily before breakfast. 30 tablet 0   lisinopril (ZESTRIL) 2.5 MG tablet TAKE 1 TABLET (2.5 MG TOTAL) BY MOUTH DAILY. 90 tablet 0   sitaGLIPtin-metformin (JANUMET) 50-500  MG tablet Take 1 tablet by mouth 2 (two) times daily with a meal. 60 tablet 3   omeprazole (PRILOSEC) 20 MG capsule TAKE 1 CAPSULE (20 MG TOTAL) BY MOUTH 2 (TWO) TIMES DAILY BEFORE A MEAL FOR 7 DAYS. 14 capsule 0   No facility-administered medications prior to visit.    Allergies  Allergen Reactions   Metformin And Related     Can not take 1000 mg of metformin as it causes nausea and headaches.   Victoza [Liraglutide] Other (See Comments)    Dizziness       Objective:    BP 111/76   Pulse 82   Wt 148 lb 6.4 oz (67.3 kg)   SpO2 98%   BMI 28.04 kg/m  Wt Readings from Last 3 Encounters:  08/15/21 148 lb 6.4 oz (67.3 kg)  05/16/21 147 lb (66.7 kg)  03/05/21 147 lb 8 oz (66.9 kg)    Physical Exam Exam conducted with a chaperone present.  Constitutional:      Appearance: She is well-developed.  HENT:     Head: Normocephalic.  Cardiovascular:     Rate and Rhythm: Normal rate and regular rhythm.     Heart sounds: Normal heart sounds.  Pulmonary:     Effort: Pulmonary effort is normal.     Breath sounds: Normal breath sounds.  Abdominal:     General: Bowel sounds are normal.     Palpations: Abdomen is soft.     Hernia: There is no hernia in the left inguinal area.  Genitourinary:    Exam position: Lithotomy position.     Labia:        Right: No rash, tenderness, lesion or injury.        Left: No rash, tenderness, lesion or injury.      Vagina: Normal. No signs of injury and foreign body. No vaginal discharge, erythema, tenderness or bleeding.     Cervix: Normal.     Uterus: Not deviated and not enlarged.      Adnexa:        Right: No mass, tenderness or fullness.         Left: No mass, tenderness or fullness.       Rectum: Normal. No external hemorrhoid.  Lymphadenopathy:     Lower Body: No right inguinal adenopathy. No left inguinal adenopathy.  Skin:    General: Skin is warm and dry.  Neurological:     Mental Status: She is alert and oriented to person, place,  and time.  Psychiatric:        Behavior: Behavior normal.        Thought Content: Thought content normal.        Judgment: Judgment normal.          Patient has been counseled extensively about  nutrition and exercise as well as the importance of adherence with medications and regular follow-up. The patient was given clear instructions to go to ER or return to medical center if symptoms don't improve, worsen or new problems develop. The patient verbalized understanding.   Follow-up: Return in about 3 months (around 11/15/2021) for PHYSICAL.   Gildardo Pounds, FNP-BC [facility] [place], [place]   08/15/2021, 10:17 AM

## 2021-08-16 ENCOUNTER — Other Ambulatory Visit: Payer: Self-pay

## 2021-08-16 LAB — LIPID PANEL
Chol/HDL Ratio: 4.8 ratio — ABNORMAL HIGH (ref 0.0–4.4)
Cholesterol, Total: 203 mg/dL — ABNORMAL HIGH (ref 100–199)
HDL: 42 mg/dL (ref >39)
LDL Chol Calc (NIH): 137 mg/dL — ABNORMAL HIGH (ref 0–99)
Triglycerides: 133 mg/dL (ref 0–149)
VLDL Cholesterol Cal: 24 mg/dL (ref 5–40)

## 2021-08-16 LAB — CERVICOVAGINAL ANCILLARY ONLY
Bacterial Vaginitis (gardnerella): NEGATIVE
Candida Glabrata: POSITIVE — AB
Candida Vaginitis: NEGATIVE
Chlamydia: NEGATIVE
Comment: NEGATIVE
Comment: NEGATIVE
Comment: NEGATIVE
Comment: NEGATIVE
Comment: NEGATIVE
Comment: NORMAL
Neisseria Gonorrhea: NEGATIVE
Trichomonas: NEGATIVE

## 2021-08-16 LAB — HEMOGLOBIN A1C
Est. average glucose Bld gHb Est-mCnc: 157 mg/dL
Hgb A1c MFr Bld: 7.1 % — ABNORMAL HIGH (ref 4.8–5.6)

## 2021-08-16 LAB — BASIC METABOLIC PANEL
BUN/Creatinine Ratio: 21 (ref 9–23)
BUN: 14 mg/dL (ref 6–20)
CO2: 21 mmol/L (ref 20–29)
Calcium: 9.6 mg/dL (ref 8.7–10.2)
Chloride: 102 mmol/L (ref 96–106)
Creatinine, Ser: 0.68 mg/dL (ref 0.57–1.00)
Glucose: 108 mg/dL — ABNORMAL HIGH (ref 70–99)
Potassium: 4.1 mmol/L (ref 3.5–5.2)
Sodium: 138 mmol/L (ref 134–144)
eGFR: 114 mL/min/{1.73_m2} (ref >59)

## 2021-08-17 LAB — CYTOLOGY - PAP
Comment: NEGATIVE
Diagnosis: NEGATIVE
High risk HPV: NEGATIVE

## 2021-08-19 ENCOUNTER — Other Ambulatory Visit: Payer: Self-pay | Admitting: Nurse Practitioner

## 2021-08-19 DIAGNOSIS — B3731 Acute candidiasis of vulva and vagina: Secondary | ICD-10-CM

## 2021-08-19 MED ORDER — FLUCONAZOLE 150 MG PO TABS
150.0000 mg | ORAL_TABLET | Freq: Once | ORAL | 0 refills | Status: AC
  Filled 2021-08-19: qty 1, 1d supply, fill #0

## 2021-08-20 ENCOUNTER — Other Ambulatory Visit: Payer: Self-pay

## 2021-08-21 ENCOUNTER — Other Ambulatory Visit: Payer: Self-pay

## 2021-08-22 ENCOUNTER — Other Ambulatory Visit: Payer: Self-pay

## 2021-08-24 ENCOUNTER — Other Ambulatory Visit: Payer: Self-pay

## 2021-08-28 DIAGNOSIS — Z419 Encounter for procedure for purposes other than remedying health state, unspecified: Secondary | ICD-10-CM | POA: Diagnosis not present

## 2021-09-11 ENCOUNTER — Other Ambulatory Visit: Payer: Self-pay

## 2021-09-13 ENCOUNTER — Other Ambulatory Visit: Payer: Self-pay

## 2021-09-28 DIAGNOSIS — Z419 Encounter for procedure for purposes other than remedying health state, unspecified: Secondary | ICD-10-CM | POA: Diagnosis not present

## 2021-10-24 ENCOUNTER — Other Ambulatory Visit: Payer: Self-pay

## 2021-10-25 ENCOUNTER — Other Ambulatory Visit: Payer: Self-pay

## 2021-10-28 DIAGNOSIS — Z419 Encounter for procedure for purposes other than remedying health state, unspecified: Secondary | ICD-10-CM | POA: Diagnosis not present

## 2021-11-19 ENCOUNTER — Encounter: Payer: Self-pay | Admitting: Nurse Practitioner

## 2021-11-19 ENCOUNTER — Other Ambulatory Visit: Payer: Self-pay

## 2021-11-19 ENCOUNTER — Ambulatory Visit: Payer: Medicaid Other | Attending: Nurse Practitioner | Admitting: Nurse Practitioner

## 2021-11-19 VITALS — BP 113/75 | HR 69 | Temp 97.8°F | Ht 61.0 in | Wt 148.4 lb

## 2021-11-19 DIAGNOSIS — J301 Allergic rhinitis due to pollen: Secondary | ICD-10-CM | POA: Diagnosis not present

## 2021-11-19 DIAGNOSIS — R7989 Other specified abnormal findings of blood chemistry: Secondary | ICD-10-CM

## 2021-11-19 DIAGNOSIS — E1165 Type 2 diabetes mellitus with hyperglycemia: Secondary | ICD-10-CM

## 2021-11-19 DIAGNOSIS — Z23 Encounter for immunization: Secondary | ICD-10-CM

## 2021-11-19 DIAGNOSIS — Z0001 Encounter for general adult medical examination with abnormal findings: Secondary | ICD-10-CM | POA: Diagnosis not present

## 2021-11-19 DIAGNOSIS — Z0000 Encounter for general adult medical examination without abnormal findings: Secondary | ICD-10-CM

## 2021-11-19 DIAGNOSIS — E7800 Pure hypercholesterolemia, unspecified: Secondary | ICD-10-CM

## 2021-11-19 MED ORDER — CETIRIZINE HCL 10 MG PO TABS
10.0000 mg | ORAL_TABLET | Freq: Every day | ORAL | 3 refills | Status: DC
  Filled 2021-11-19: qty 30, 30d supply, fill #0
  Filled 2022-02-20: qty 30, 30d supply, fill #1

## 2021-11-19 MED ORDER — LISINOPRIL 2.5 MG PO TABS
2.5000 mg | ORAL_TABLET | Freq: Every day | ORAL | 1 refills | Status: DC
  Filled 2021-11-19: qty 90, 90d supply, fill #0
  Filled 2022-02-20: qty 90, 90d supply, fill #1

## 2021-11-19 MED ORDER — ATORVASTATIN CALCIUM 20 MG PO TABS
20.0000 mg | ORAL_TABLET | Freq: Every day | ORAL | 3 refills | Status: DC
  Filled 2021-11-19: qty 90, 90d supply, fill #0
  Filled 2022-02-20: qty 90, 90d supply, fill #1

## 2021-11-19 MED ORDER — EMPAGLIFLOZIN 25 MG PO TABS
25.0000 mg | ORAL_TABLET | Freq: Every day | ORAL | 1 refills | Status: DC
  Filled 2021-11-19: qty 90, 90d supply, fill #0
  Filled 2022-02-20: qty 90, 90d supply, fill #1

## 2021-11-19 MED ORDER — GLIMEPIRIDE 2 MG PO TABS
2.0000 mg | ORAL_TABLET | Freq: Every day | ORAL | 1 refills | Status: DC
  Filled 2021-11-19: qty 90, 90d supply, fill #0

## 2021-11-19 MED ORDER — JANUMET 50-500 MG PO TABS
1.0000 | ORAL_TABLET | Freq: Two times a day (BID) | ORAL | 1 refills | Status: DC
  Filled 2021-11-19: qty 180, 90d supply, fill #0
  Filled 2022-02-20: qty 180, 90d supply, fill #1

## 2021-11-19 NOTE — Progress Notes (Signed)
Assessment & Plan:  Veronica Love was seen today for annual exam.  Diagnoses and all orders for this visit:  Encounter for annual physical exam  Type 2 diabetes mellitus with hyperglycemia, without long-term current use of insulin  Continue all antihypertensives as prescribed.  Reminded to bring in blood pressure log for follow  up appointment.  RECOMMENDATIONS: DASH/Mediterranean Diets are healthier choices for HTN.   -     Hemoglobin A1c -     CMP14+EGFR -     Microalbumin / creatinine urine ratio -     sitaGLIPtin-metformin (JANUMET) 50-500 MG tablet; Take 1 tablet by mouth 2 (two) times daily with a meal. -     lisinopril (ZESTRIL) 2.5 MG tablet; Take 1 tablet (2.5 mg total) by mouth daily. -     glimepiride (AMARYL) 2 MG tablet; Take 1 tablet (2 mg total) by mouth daily before breakfast. -     empagliflozin (JARDIANCE) 25 MG TABS tablet; Take 1 tablet (25 mg total) by mouth daily.  Hypercholesteremia -     Lipid panel -     atorvastatin (LIPITOR) 20 MG tablet; Take 1 tablet (20 mg total) by mouth daily. INSTRUCTIONS: Work on a low fat, heart healthy diet and participate in regular aerobic exercise program by working out at least 150 minutes per week; 5 days a week-30 minutes per day. Avoid red meat/beef/steak,  fried foods. junk foods, sodas, sugary drinks, unhealthy snacking, alcohol and smoking.  Drink at least 80 oz of water per day and monitor your carbohydrate intake daily.    Abnormal CBC -     CBC with Differential  Low TSH level -     Cancel: Thyroid Panel With TSH  Seasonal allergic rhinitis due to pollen -     cetirizine (ZYRTEC) 10 MG tablet; Take 1 tablet (10 mg total) by mouth daily.  Need for immunization against influenza -     Flu Vaccine QUAD 64moIM (Fluarix, Fluzone & Alfiuria Quad PF)    Patient has been counseled on age-appropriate routine health concerns for screening and prevention. These are reviewed and up-to-date. Referrals have been placed accordingly.  Immunizations are up-to-date or declined.    Subjective:   Chief Complaint  Patient presents with   Annual Exam   HPI Veronica ITALIANO89y.o. female presents to office today for annual physical.  She has a PMH of  Hyperthyroidism (followed by endocrinology) Type 2 DM  She has her glucometer with her today with average readings as follows:  7 day 135 14 day 139 30 day 141 90 day 145 Lab Results  Component Value Date   HGBA1C 7.1 (H) 08/15/2021     Review of Systems  Constitutional:  Negative for fever, malaise/fatigue and weight loss.  HENT: Negative.  Negative for nosebleeds.   Eyes: Negative.  Negative for blurred vision, double vision and photophobia.  Respiratory: Negative.  Negative for cough and shortness of breath.   Cardiovascular: Negative.  Negative for chest pain, palpitations and leg swelling.  Gastrointestinal: Negative.  Negative for heartburn, nausea and vomiting.  Genitourinary: Negative.   Musculoskeletal: Negative.  Negative for myalgias.  Skin: Negative.   Neurological: Negative.  Negative for dizziness, focal weakness, seizures and headaches.  Endo/Heme/Allergies: Negative.   Psychiatric/Behavioral: Negative.  Negative for suicidal ideas.     Past Medical History:  Diagnosis Date   Diabetes mellitus, type 2 ([place]    Gestational diabetes 2013   History of positive PPD 2009   with negative  chest x-ray   Hypercholesteremia    No pertinent past medical history    Subclinical hyperthyroidism     Past Surgical History:  Procedure Laterality Date   NO PAST SURGERIES      Family History  Problem Relation Age of Onset   Diabetes Father     Social History Reviewed with no changes to be made today.   Outpatient Medications Prior to Visit  Medication Sig Dispense Refill   Accu-Chek FastClix Lancets MISC USE AS INSTRUCTED. CHECK BLOOD GLUCOSE LEVEL BY FINGERSTICK TWICE PER DAY. 102 each 12   Blood Glucose Monitoring Suppl (ACCU-CHEK GUIDE) w/Device KIT 1  each by Does not apply route daily. 1 kit 0   etonogestrel (NEXPLANON) 68 MG IMPL implant Inject 1 each (68 mg total) into the skin once. 1 each 0   glucose blood (ACCU-CHEK GUIDE) test strip USE AS INSTRUCTED. CHECK BLOOD GLUCOSE LEVEL BY FINGERSTICK TWICE PER DAY. E11.65 100 strip 12   methimazole (TAPAZOLE) 5 MG tablet Take 1 tablet (5 mg total) by mouth daily. 90 tablet 3   atorvastatin (LIPITOR) 20 MG tablet TAKE 1 TABLET (20 MG TOTAL) BY MOUTH DAILY AT 6 PM. 90 tablet 3   cetirizine (ZYRTEC) 10 MG tablet Take 1 tablet (10 mg total) by mouth daily. 90 tablet 3   empagliflozin (JARDIANCE) 25 MG TABS tablet TAKE 1 TABLET (25 MG TOTAL) BY MOUTH DAILY BEFORE BREAKFAST. 90 tablet 1   glimepiride (AMARYL) 2 MG tablet Take 1 tablet (2 mg total) by mouth daily before breakfast. 90 tablet 1   lisinopril (ZESTRIL) 2.5 MG tablet TAKE 1 TABLET (2.5 MG TOTAL) BY MOUTH DAILY. 90 tablet 1   sitaGLIPtin-metformin (JANUMET) 50-500 MG tablet Take 1 tablet by mouth 2 (two) times daily with a meal. 180 tablet 1   omeprazole (PRILOSEC) 20 MG capsule TAKE 1 CAPSULE (20 MG TOTAL) BY MOUTH 2 (TWO) TIMES DAILY BEFORE A MEAL FOR 7 DAYS. 14 capsule 0   No facility-administered medications prior to visit.    Allergies  Allergen Reactions   Metformin And Related     Can not take 1000 mg of metformin as it causes nausea and headaches.   Victoza [Liraglutide] Other (See Comments)    Dizziness       Objective:    BP 113/75   Pulse 69   Temp 97.8 F (36.6 C) (Temporal)   Ht '5\' 1"'  (1.549 m)   Wt 148 lb 6.4 oz (67.3 kg)   SpO2 98%   BMI 28.04 kg/m  Wt Readings from Last 3 Encounters:  11/19/21 148 lb 6.4 oz (67.3 kg)  08/15/21 148 lb 6.4 oz (67.3 kg)  05/16/21 147 lb (66.7 kg)    Physical Exam Constitutional:      Appearance: She is well-developed.  HENT:     Head: Normocephalic and atraumatic.     Right Ear: Hearing, tympanic membrane, ear canal and external ear normal.     Left Ear: Hearing,  tympanic membrane, ear canal and external ear normal.     Nose: Nose normal.     Right Turbinates: Not enlarged.     Left Turbinates: Not enlarged.     Mouth/Throat:     Lips: Pink.     Mouth: Mucous membranes are moist.     Dentition: No dental tenderness, gingival swelling, dental abscesses or gum lesions.     Pharynx: No oropharyngeal exudate.  Eyes:     General: No scleral icterus.  Right eye: No discharge.     Extraocular Movements: Extraocular movements intact.     Conjunctiva/sclera: Conjunctivae normal.     Pupils: Pupils are equal, round, and reactive to light.  Neck:     Thyroid: No thyromegaly.     Trachea: No tracheal deviation.  Cardiovascular:     Rate and Rhythm: Normal rate and regular rhythm.     Heart sounds: Normal heart sounds. No murmur heard.    No friction rub.  Pulmonary:     Effort: Pulmonary effort is normal. No accessory muscle usage or respiratory distress.     Breath sounds: Normal breath sounds. No decreased breath sounds, wheezing, rhonchi or rales.  Chest:     Chest wall: No mass, swelling or tenderness.  Breasts:    Right: Normal.     Left: Normal.  Abdominal:     General: Bowel sounds are normal. There is no distension.     Palpations: Abdomen is soft. There is no mass.     Tenderness: There is no abdominal tenderness. There is no right CVA tenderness, left CVA tenderness, guarding or rebound.     Hernia: No hernia is present.  Musculoskeletal:        General: No tenderness or deformity. Normal range of motion.     Cervical back: Normal range of motion and neck supple.  Lymphadenopathy:     Cervical: No cervical adenopathy.  Skin:    General: Skin is warm and dry.     Findings: No erythema.  Neurological:     Mental Status: She is alert and oriented to person, place, and time.     Cranial Nerves: No cranial nerve deficit.     Motor: Motor function is intact.     Coordination: Coordination is intact. Coordination normal.     Gait:  Gait is intact.     Deep Tendon Reflexes:     Reflex Scores:      Patellar reflexes are 1+ on the right side and 1+ on the left side. Psychiatric:        Attention and Perception: Attention normal.        Mood and Affect: Mood normal.        Speech: Speech normal.        Behavior: Behavior normal.        Thought Content: Thought content normal.        Judgment: Judgment normal.          Patient has been counseled extensively about nutrition and exercise as well as the importance of adherence with medications and regular follow-up. The patient was given clear instructions to go to ER or return to medical center if symptoms don't improve, worsen or new problems develop. The patient verbalized understanding.   Follow-up: Return in about 3 months (around 02/19/2022) for DM.   Gildardo Pounds, FNP-BC [facility] and [place] [place], [place]   11/19/2021, 1:29 PM

## 2021-11-20 ENCOUNTER — Other Ambulatory Visit: Payer: Self-pay

## 2021-11-20 LAB — CBC WITH DIFFERENTIAL/PLATELET
Basophils Absolute: 0 10*3/uL (ref 0.0–0.2)
Basos: 1 %
EOS (ABSOLUTE): 0.1 10*3/uL (ref 0.0–0.4)
Eos: 1 %
Hematocrit: 42.8 % (ref 34.0–46.6)
Hemoglobin: 13.9 g/dL (ref 11.1–15.9)
Immature Grans (Abs): 0 10*3/uL (ref 0.0–0.1)
Immature Granulocytes: 0 %
Lymphocytes Absolute: 3.5 10*3/uL — ABNORMAL HIGH (ref 0.7–3.1)
Lymphs: 43 %
MCH: 24.4 pg — ABNORMAL LOW (ref 26.6–33.0)
MCHC: 32.5 g/dL (ref 31.5–35.7)
MCV: 75 fL — ABNORMAL LOW (ref 79–97)
Monocytes Absolute: 0.4 10*3/uL (ref 0.1–0.9)
Monocytes: 5 %
Neutrophils Absolute: 4.1 10*3/uL (ref 1.4–7.0)
Neutrophils: 50 %
Platelets: 295 10*3/uL (ref 150–450)
RBC: 5.7 x10E6/uL — ABNORMAL HIGH (ref 3.77–5.28)
RDW: 14.9 % (ref 11.7–15.4)
WBC: 8.2 10*3/uL (ref 3.4–10.8)

## 2021-11-20 LAB — CMP14+EGFR
ALT: 16 IU/L (ref 0–32)
AST: 14 IU/L (ref 0–40)
Albumin/Globulin Ratio: 2 (ref 1.2–2.2)
Albumin: 4.9 g/dL (ref 3.9–4.9)
Alkaline Phosphatase: 61 IU/L (ref 44–121)
BUN/Creatinine Ratio: 24 — ABNORMAL HIGH (ref 9–23)
BUN: 15 mg/dL (ref 6–20)
Bilirubin Total: 0.3 mg/dL (ref 0.0–1.2)
CO2: 21 mmol/L (ref 20–29)
Calcium: 9.4 mg/dL (ref 8.7–10.2)
Chloride: 102 mmol/L (ref 96–106)
Creatinine, Ser: 0.63 mg/dL (ref 0.57–1.00)
Globulin, Total: 2.5 g/dL (ref 1.5–4.5)
Glucose: 91 mg/dL (ref 70–99)
Potassium: 4.2 mmol/L (ref 3.5–5.2)
Sodium: 138 mmol/L (ref 134–144)
Total Protein: 7.4 g/dL (ref 6.0–8.5)
eGFR: 116 mL/min/{1.73_m2} (ref >59)

## 2021-11-20 LAB — HEMOGLOBIN A1C
Est. average glucose Bld gHb Est-mCnc: 160 mg/dL
Hgb A1c MFr Bld: 7.2 % — ABNORMAL HIGH (ref 4.8–5.6)

## 2021-11-20 LAB — LIPID PANEL
Chol/HDL Ratio: 4.8 ratio — ABNORMAL HIGH (ref 0.0–4.4)
Cholesterol, Total: 231 mg/dL — ABNORMAL HIGH (ref 100–199)
HDL: 48 mg/dL (ref >39)
LDL Chol Calc (NIH): 159 mg/dL — ABNORMAL HIGH (ref 0–99)
Triglycerides: 133 mg/dL (ref 0–149)
VLDL Cholesterol Cal: 24 mg/dL (ref 5–40)

## 2021-11-21 LAB — MICROALBUMIN / CREATININE URINE RATIO
Creatinine, Urine: 50 mg/dL
Microalb/Creat Ratio: <6 mg/g creat (ref 0–29)
Microalbumin, Urine: <3 ug/mL

## 2021-11-27 ENCOUNTER — Other Ambulatory Visit: Payer: Self-pay

## 2021-11-28 DIAGNOSIS — Z419 Encounter for procedure for purposes other than remedying health state, unspecified: Secondary | ICD-10-CM | POA: Diagnosis not present

## 2021-12-28 DIAGNOSIS — Z419 Encounter for procedure for purposes other than remedying health state, unspecified: Secondary | ICD-10-CM | POA: Diagnosis not present

## 2022-01-28 DIAGNOSIS — Z419 Encounter for procedure for purposes other than remedying health state, unspecified: Secondary | ICD-10-CM | POA: Diagnosis not present

## 2022-02-20 ENCOUNTER — Encounter: Payer: Self-pay | Admitting: Nurse Practitioner

## 2022-02-20 ENCOUNTER — Other Ambulatory Visit: Payer: Self-pay

## 2022-02-20 ENCOUNTER — Ambulatory Visit: Payer: Medicaid Other | Attending: Nurse Practitioner | Admitting: Nurse Practitioner

## 2022-02-20 VITALS — BP 119/83 | HR 84 | Ht 61.0 in | Wt 148.0 lb

## 2022-02-20 DIAGNOSIS — E7800 Pure hypercholesterolemia, unspecified: Secondary | ICD-10-CM | POA: Diagnosis not present

## 2022-02-20 DIAGNOSIS — E1165 Type 2 diabetes mellitus with hyperglycemia: Secondary | ICD-10-CM | POA: Diagnosis not present

## 2022-02-20 LAB — GLUCOSE, POCT (MANUAL RESULT ENTRY): POC Glucose: 129 mg/dl — AB (ref 70–99)

## 2022-02-20 LAB — POCT GLYCOSYLATED HEMOGLOBIN (HGB A1C): Hemoglobin A1C: 7.7 % — AB (ref 4.0–5.6)

## 2022-02-20 MED ORDER — LISINOPRIL 2.5 MG PO TABS
2.5000 mg | ORAL_TABLET | Freq: Every day | ORAL | 1 refills | Status: DC
  Filled 2022-02-20 – 2022-11-04 (×2): qty 90, 90d supply, fill #0
  Filled 2023-02-19 (×2): qty 90, 90d supply, fill #1

## 2022-02-20 MED ORDER — EMPAGLIFLOZIN 25 MG PO TABS
25.0000 mg | ORAL_TABLET | Freq: Every day | ORAL | 1 refills | Status: DC
  Filled 2022-02-20 – 2022-08-14 (×2): qty 90, 90d supply, fill #0

## 2022-02-20 MED ORDER — GLIMEPIRIDE 4 MG PO TABS
4.0000 mg | ORAL_TABLET | Freq: Every day | ORAL | 1 refills | Status: DC
  Filled 2022-02-20: qty 90, 90d supply, fill #0
  Filled 2022-05-16: qty 90, 90d supply, fill #1

## 2022-02-20 MED ORDER — JANUMET 50-500 MG PO TABS
1.0000 | ORAL_TABLET | Freq: Two times a day (BID) | ORAL | 1 refills | Status: DC
  Filled 2022-02-20 – 2022-03-25 (×2): qty 180, 90d supply, fill #0
  Filled 2022-07-15: qty 60, 30d supply, fill #1
  Filled 2022-07-15: qty 120, 60d supply, fill #1
  Filled 2022-08-14: qty 120, 60d supply, fill #2

## 2022-02-20 NOTE — Progress Notes (Signed)
Assessment & Plan:  Veronica Love was seen today for diabetes.  Diagnoses and all orders for this visit:  Type 2 diabetes mellitus with hyperglycemia, without long-term current use of insulin (HCC) -     POCT glycosylated hemoglobin (Hb A1C) -     POCT glucose (manual entry) -     CMP14+EGFR -     glimepiride (AMARYL) 4 MG tablet; Take 1 tablet (4 mg total) by mouth daily before breakfast. -     empagliflozin (JARDIANCE) 25 MG TABS tablet; Take 1 tablet (25 mg total) by mouth daily. -     lisinopril (ZESTRIL) 2.5 MG tablet; Take 1 tablet (2.5 mg total) by mouth daily. -     sitaGLIPtin-metformin (JANUMET) 50-500 MG tablet; Take 1 tablet by mouth 2 (two) times daily with a meal. Continue blood sugar control as discussed in office today, low carbohydrate diet, and regular physical exercise as tolerated, 150 minutes per week (30 min each day, 5 days per week, or 50 min 3 days per week). Keep blood sugar logs with fasting goal of 90-130 mg/dl, post prandial (after you eat) less than 180.  For Hypoglycemia: BS <60 and Hyperglycemia BS >400; contact the clinic ASAP. Annual eye exams and foot exams are recommended.    Hypercholesteremia -     Lipid panel    Patient has been counseled on age-appropriate routine health concerns for screening and prevention. These are reviewed and up-to-date. Referrals have been placed accordingly. Immunizations are up-to-date or declined.    Subjective:   Chief Complaint  Patient presents with   Diabetes   HPI Veronica Love 37 y.o. female presents to office today for follow up to DM. She is followed by endo for her Hyperthyroidism.   She is accompanied by an onsite interpreter today.  DM 2 7 day average 172 14 day average 165 30 day average 160 90 day average 142  A1c up to 7.7. Will increase glimepiride to 4 mg today. She will continue on janumet 50-1000 mg BID and jardiance 25 mg daily.  Lab Results  Component Value Date   HGBA1C 7.7 (A) 02/20/2022    Lab  Results  Component Value Date   HGBA1C 7.2 (H) 11/19/2021   LDL not at goal. She has been encouraged to take atorvastatin daily as prescribed.  Lab Results  Component Value Date   LDLCALC 159 (H) 11/19/2021   On renal dose ACE BP Readings from Last 3 Encounters:  02/20/22 119/83  11/19/21 113/75  08/15/21 111/76    Review of Systems  Constitutional:  Negative for fever, malaise/fatigue and weight loss.  HENT: Negative.  Negative for nosebleeds.   Eyes: Negative.  Negative for blurred vision, double vision and photophobia.  Respiratory: Negative.  Negative for cough and shortness of breath.   Cardiovascular: Negative.  Negative for chest pain, palpitations and leg swelling.  Gastrointestinal: Negative.  Negative for heartburn, nausea and vomiting.  Musculoskeletal: Negative.  Negative for myalgias.  Neurological: Negative.  Negative for dizziness, focal weakness, seizures and headaches.  Psychiatric/Behavioral: Negative.  Negative for suicidal ideas.     Past Medical History:  Diagnosis Date   Diabetes mellitus, type 2 ([place])    Gestational diabetes 2013   History of positive PPD 2009   with negative chest x-ray   Hypercholesteremia    No pertinent past medical history    Subclinical hyperthyroidism     Past Surgical History:  Procedure Laterality Date   NO PAST SURGERIES  Family History  Problem Relation Age of Onset   Diabetes Father     Social History Reviewed with no changes to be made today.   Outpatient Medications Prior to Visit  Medication Sig Dispense Refill   atorvastatin (LIPITOR) 20 MG tablet Take 1 tablet (20 mg total) by mouth daily. 90 tablet 3   Blood Glucose Monitoring Suppl (ACCU-CHEK GUIDE) w/Device KIT 1 each by Does not apply route daily. 1 kit 0   cetirizine (ZYRTEC) 10 MG tablet Take 1 tablet (10 mg total) by mouth daily. 90 tablet 3   etonogestrel (NEXPLANON) 68 MG IMPL implant Inject 1 each (68 mg total) into the skin once. 1 each 0    methimazole (TAPAZOLE) 5 MG tablet Take 1 tablet (5 mg total) by mouth daily. 90 tablet 3   empagliflozin (JARDIANCE) 25 MG TABS tablet Take 1 tablet (25 mg total) by mouth daily. 90 tablet 1   glimepiride (AMARYL) 2 MG tablet Take 1 tablet (2 mg total) by mouth daily before breakfast. 90 tablet 1   lisinopril (ZESTRIL) 2.5 MG tablet Take 1 tablet (2.5 mg total) by mouth daily. 90 tablet 1   sitaGLIPtin-metformin (JANUMET) 50-500 MG tablet Take 1 tablet by mouth 2 (two) times daily with a meal. 180 tablet 1   No facility-administered medications prior to visit.    Allergies  Allergen Reactions   Metformin And Related     Can not take 1000 mg of metformin as it causes nausea and headaches.   Victoza [Liraglutide] Other (See Comments)    Dizziness       Objective:    BP 119/83   Pulse 84   Ht 5\' 1"  (1.549 m)   Wt 148 lb (67.1 kg)   SpO2 99%   BMI 27.96 kg/m  Wt Readings from Last 3 Encounters:  02/20/22 148 lb (67.1 kg)  11/19/21 148 lb 6.4 oz (67.3 kg)  08/15/21 148 lb 6.4 oz (67.3 kg)    Physical Exam Vitals and nursing note reviewed.  Constitutional:      Appearance: She is well-developed.  HENT:     Head: Normocephalic and atraumatic.  Cardiovascular:     Rate and Rhythm: Normal rate and regular rhythm.     Heart sounds: Normal heart sounds. No murmur heard.    No friction rub. No gallop.  Pulmonary:     Effort: Pulmonary effort is normal. No tachypnea or respiratory distress.     Breath sounds: Normal breath sounds. No decreased breath sounds, wheezing, rhonchi or rales.  Chest:     Chest wall: No tenderness.  Abdominal:     General: Bowel sounds are normal.     Palpations: Abdomen is soft.  Musculoskeletal:        General: Normal range of motion.     Cervical back: Normal range of motion.  Skin:    General: Skin is warm and dry.  Neurological:     Mental Status: She is alert and oriented to person, place, and time.     Coordination: Coordination normal.   Psychiatric:        Behavior: Behavior normal. Behavior is cooperative.        Thought Content: Thought content normal.        Judgment: Judgment normal.          Patient has been counseled extensively about nutrition and exercise as well as the importance of adherence with medications and regular follow-up. The patient was given clear instructions to go to  ER or return to medical center if symptoms don't improve, worsen or new problems develop. The patient verbalized understanding.   Follow-up: Return in about 3 months (around 05/22/2022).   Claiborne Rigg, FNP-BC [facility] and [facility] [place], [place] [phone]   02/20/2022, 9:46 AM

## 2022-02-20 NOTE — Progress Notes (Signed)
Right elbow and right knee pain. 2 months   Y hin- interpreter

## 2022-02-21 ENCOUNTER — Other Ambulatory Visit: Payer: Self-pay

## 2022-02-21 LAB — CMP14+EGFR
ALT: 18 IU/L (ref 0–32)
AST: 16 IU/L (ref 0–40)
Albumin/Globulin Ratio: 1.7 (ref 1.2–2.2)
Albumin: 5 g/dL — ABNORMAL HIGH (ref 3.9–4.9)
Alkaline Phosphatase: 63 IU/L (ref 44–121)
BUN/Creatinine Ratio: 19 (ref 9–23)
BUN: 14 mg/dL (ref 6–20)
Bilirubin Total: 0.4 mg/dL (ref 0.0–1.2)
CO2: 20 mmol/L (ref 20–29)
Calcium: 9.6 mg/dL (ref 8.7–10.2)
Chloride: 101 mmol/L (ref 96–106)
Creatinine, Ser: 0.72 mg/dL (ref 0.57–1.00)
Globulin, Total: 2.9 g/dL (ref 1.5–4.5)
Glucose: 113 mg/dL — ABNORMAL HIGH (ref 70–99)
Potassium: 4.1 mmol/L (ref 3.5–5.2)
Sodium: 140 mmol/L (ref 134–144)
Total Protein: 7.9 g/dL (ref 6.0–8.5)
eGFR: 110 mL/min/{1.73_m2} (ref >59)

## 2022-02-21 LAB — LIPID PANEL
Chol/HDL Ratio: 4.3 ratio (ref 0.0–4.4)
Cholesterol, Total: 209 mg/dL — ABNORMAL HIGH (ref 100–199)
HDL: 49 mg/dL (ref >39)
LDL Chol Calc (NIH): 130 mg/dL — ABNORMAL HIGH (ref 0–99)
Triglycerides: 168 mg/dL — ABNORMAL HIGH (ref 0–149)
VLDL Cholesterol Cal: 30 mg/dL (ref 5–40)

## 2022-02-28 ENCOUNTER — Other Ambulatory Visit: Payer: Self-pay

## 2022-02-28 DIAGNOSIS — Z419 Encounter for procedure for purposes other than remedying health state, unspecified: Secondary | ICD-10-CM | POA: Diagnosis not present

## 2022-03-25 ENCOUNTER — Other Ambulatory Visit: Payer: Self-pay | Admitting: Nurse Practitioner

## 2022-03-25 ENCOUNTER — Other Ambulatory Visit: Payer: Self-pay

## 2022-03-25 DIAGNOSIS — E1165 Type 2 diabetes mellitus with hyperglycemia: Secondary | ICD-10-CM

## 2022-03-26 ENCOUNTER — Other Ambulatory Visit: Payer: Self-pay

## 2022-03-26 NOTE — Telephone Encounter (Signed)
Requested medication (s) are due for refill today - expired Rx  Requested medication (s) are on the active medication list -no  Future visit scheduled -yes  Last refill: 02/13/21  Notes to clinic: expired Rx, no longer on current medication list  Requested Prescriptions  Pending Prescriptions Disp Refills   glucose blood (ACCU-CHEK GUIDE) test strip 100 strip 12    Sig: USE AS INSTRUCTED. CHECK BLOOD GLUCOSE LEVEL BY FINGERSTICK TWICE PER DAY. E11.65     Endocrinology: Diabetes - Testing Supplies Passed - 03/25/2022  7:54 AM      Passed - Valid encounter within last 12 months    Recent Outpatient Visits           1 month ago Type 2 diabetes mellitus with hyperglycemia, without long-term current use of insulin [facility])   [place], Vernia Buff, NP   4 months ago Encounter for annual physical exam   [place], Vernia Buff, NP   7 months ago Encounter for Papanicolaou smear for cervical cancer screening   [place], [place] W, NP   1 year ago Type 2 diabetes mellitus with hyperglycemia, without long-term current use of insulin [facility])   [place], [place] W, NP   1 year ago Type 2 diabetes mellitus with hyperglycemia, without long-term current use of insulin [facility])   [place], RPH-CPP       Future Appointments             In 1 month Gildardo Pounds, NP [place]             Accu-Chek FastClix Lancets MISC 102 each 12    Sig: USE AS INSTRUCTED. CHECK BLOOD GLUCOSE LEVEL BY FINGERSTICK TWICE PER DAY.     Endocrinology: Diabetes - Testing Supplies Passed - 03/25/2022  7:54 AM      Passed - Valid encounter within last 12 months    Recent Outpatient Visits           1 month ago Type 2 diabetes mellitus with  hyperglycemia, without long-term current use of insulin [facility])   [place], Vernia Buff, NP   4 months ago Encounter for annual physical exam   [place], Vernia Buff, NP   7 months ago Encounter for Papanicolaou smear for cervical cancer screening   [place], [place] W, NP   1 year ago Type 2 diabetes mellitus with hyperglycemia, without long-term current use of insulin [facility])   [place] [employer], [place] W, NP   1 year ago Type 2 diabetes mellitus with hyperglycemia, without long-term current use of insulin [facility])   [place], RPH-CPP       Future Appointments             In 1 month Gildardo Pounds, NP [place]               Requested Prescriptions  Pending Prescriptions Disp Refills   glucose blood (ACCU-CHEK GUIDE) test strip 100 strip 12    Sig: USE AS INSTRUCTED. CHECK BLOOD GLUCOSE LEVEL BY FINGERSTICK TWICE PER  DAY. E11.65     Endocrinology: Diabetes - Testing Supplies Passed - 03/25/2022  7:54 AM      Passed - Valid encounter within last 12 months    Recent Outpatient Visits           1 month ago Type 2 diabetes mellitus with hyperglycemia, without long-term current use of insulin [facility])   [place], Vernia Buff, NP   4 months ago Encounter for annual physical exam   [place], Vernia Buff, NP   7 months ago Encounter for Papanicolaou smear for cervical cancer screening   [place], [place] W, NP   1 year ago Type 2 diabetes mellitus with hyperglycemia, without long-term current use of insulin [facility])   [place], [place] W, NP   1 year ago Type 2  diabetes mellitus with hyperglycemia, without long-term current use of insulin [facility])   [place], RPH-CPP       Future Appointments             In 1 month Gildardo Pounds, NP [place]             Accu-Chek FastClix Lancets MISC 102 each 12    Sig: USE AS INSTRUCTED. CHECK BLOOD GLUCOSE LEVEL BY FINGERSTICK TWICE PER DAY.     Endocrinology: Diabetes - Testing Supplies Passed - 03/25/2022  7:54 AM      Passed - Valid encounter within last 12 months    Recent Outpatient Visits           1 month ago Type 2 diabetes mellitus with hyperglycemia, without long-term current use of insulin [facility])   [place], Vernia Buff, NP   4 months ago Encounter for annual physical exam   [place], Vernia Buff, NP   7 months ago Encounter for Papanicolaou smear for cervical cancer screening   [place], [place] W, NP   1 year ago Type 2 diabetes mellitus with hyperglycemia, without long-term current use of insulin [facility])   [place], [place] W, NP   1 year ago Type 2 diabetes mellitus with hyperglycemia, without long-term current use of insulin [facility])   [place], RPH-CPP       Future Appointments             In 1 month Gildardo Pounds, NP [place]

## 2022-03-27 ENCOUNTER — Other Ambulatory Visit: Payer: Self-pay

## 2022-03-27 MED ORDER — ACCU-CHEK SOFTCLIX LANCETS MISC
12 refills | Status: DC
  Filled 2022-03-27: qty 100, 50d supply, fill #0
  Filled 2022-06-19: qty 100, 50d supply, fill #1
  Filled 2022-11-04: qty 100, 50d supply, fill #2
  Filled 2023-02-19 (×3): qty 100, 50d supply, fill #3

## 2022-03-27 MED ORDER — ACCU-CHEK GUIDE VI STRP
ORAL_STRIP | 2 refills | Status: DC
  Filled 2022-03-27: qty 100, 50d supply, fill #0
  Filled 2022-06-19: qty 100, 50d supply, fill #1
  Filled 2022-11-04: qty 100, 50d supply, fill #2

## 2022-03-28 ENCOUNTER — Other Ambulatory Visit: Payer: Self-pay

## 2022-03-29 DIAGNOSIS — Z419 Encounter for procedure for purposes other than remedying health state, unspecified: Secondary | ICD-10-CM | POA: Diagnosis not present

## 2022-04-01 ENCOUNTER — Other Ambulatory Visit: Payer: Self-pay

## 2022-04-29 DIAGNOSIS — Z419 Encounter for procedure for purposes other than remedying health state, unspecified: Secondary | ICD-10-CM | POA: Diagnosis not present

## 2022-05-13 ENCOUNTER — Ambulatory Visit (INDEPENDENT_AMBULATORY_CARE_PROVIDER_SITE_OTHER): Payer: Medicaid Other | Admitting: Internal Medicine

## 2022-05-13 ENCOUNTER — Encounter: Payer: Self-pay | Admitting: Internal Medicine

## 2022-05-13 ENCOUNTER — Other Ambulatory Visit: Payer: Self-pay

## 2022-05-13 VITALS — BP 118/74 | HR 75 | Ht 61.0 in | Wt 148.0 lb

## 2022-05-13 DIAGNOSIS — E0590 Thyrotoxicosis, unspecified without thyrotoxic crisis or storm: Secondary | ICD-10-CM

## 2022-05-13 LAB — T4, FREE: Free T4: 0.86 ng/dL (ref 0.60–1.60)

## 2022-05-13 LAB — TSH: TSH: 0.61 u[IU]/mL (ref 0.35–5.50)

## 2022-05-13 MED ORDER — METHIMAZOLE 5 MG PO TABS
5.0000 mg | ORAL_TABLET | Freq: Every day | ORAL | 3 refills | Status: DC
  Filled 2022-05-13: qty 90, 90d supply, fill #0
  Filled 2022-11-04: qty 90, 90d supply, fill #1

## 2022-05-13 NOTE — Progress Notes (Signed)
Name: Veronica Love  MRN/ DOB: [id number], 1985-08-26    Age/ Sex: 37 y.o., female     PCP: Claiborne Rigg, NP   Reason for Endocrinology Evaluation: Low TSH      Initial Endocrinology Clinic Visit: 03/03/2018    PATIENT IDENTIFIER: Veronica Love is a 37 y.o., female with a past medical history of  Dyslipidemia and T2DM.Marland Kitchen She has followed with  Endocrinology clinic since 03/03/2018 for consultative assistance with management of her low TSH    HISTORICAL SUMMARY:  Pt was noted to have low TSH at 0.232 uIU/mL during routine lab workup in 08/2017. Repeat labs in 11/2017 confirmed similar results.  Thyroid ultrasound in 02/2018 showed no evidence of thyroid nodules.   An order for thyroid uptake and scan was order in 05/2019 but this has not been done as the pt did not know where to go   Methimazole was started 11/2019  SUBJECTIVE:   Today (05/13/2022):  Veronica Love is here for a follow up on subclinical hyperthyroidism.   Weight has been stable  Denies local neck swelling  Denies palpitations  Denies abdominal pain, constipation or diarrhea    She is on Nexplanon   Methimazole 5 mg daily      HISTORY:  Past Medical History:  Past Medical History:  Diagnosis Date   Diabetes mellitus, type 2    Gestational diabetes 2013   History of positive PPD 2009   with negative chest x-ray   Hypercholesteremia    No pertinent past medical history    Subclinical hyperthyroidism    Past Surgical History:  Past Surgical History:  Procedure Laterality Date   NO PAST SURGERIES     Social History:  reports that she has never smoked. She has never used smokeless tobacco. She reports that she does not drink alcohol and does not use drugs. Family History:  Family History  Problem Relation Age of Onset   Diabetes Father      HOME MEDICATIONS: Allergies as of 05/13/2022       Reactions   Metformin And Related    Can not take 1000 mg of metformin as it causes nausea and headaches.    Victoza [liraglutide] Other (See Comments)   Dizziness        Medication List        Accurate as of May 13, 2022 12:56 PM. If you have any questions, ask your nurse or doctor.          Accu-Chek Guide test strip Generic drug: glucose blood USE AS INSTRUCTED. CHECK BLOOD GLUCOSE LEVEL BY FINGERSTICK TWICE PER DAY.   Accu-Chek Guide w/Device Kit 1 each by Does not apply route daily.   Accu-Chek Softclix Lancets lancets Use as instructed   atorvastatin 20 MG tablet Commonly known as: LIPITOR Take 1 tablet (20 mg total) by mouth daily.   cetirizine 10 MG tablet Commonly known as: ZYRTEC Take 1 tablet (10 mg total) by mouth daily.   empagliflozin 25 MG Tabs tablet Commonly known as: JARDIANCE Take 1 tablet (25 mg total) by mouth daily.   etonogestrel 68 MG Impl implant Commonly known as: Nexplanon Inject 1 each (68 mg total) into the skin once.   glimepiride 4 MG tablet Commonly known as: AMARYL Take 1 tablet (4 mg total) by mouth daily before breakfast.   Janumet 50-500 MG tablet Generic drug: sitaGLIPtin-metformin Take 1 tablet by mouth 2 (two) times daily with a meal.   lisinopril 2.5 MG tablet  Commonly known as: ZESTRIL Take 1 tablet (2.5 mg total) by mouth daily.   methimazole 5 MG tablet Commonly known as: TAPAZOLE Take 1 tablet (5 mg total) by mouth daily.          OBJECTIVE:   PHYSICAL EXAM: VS: BP 118/74 (BP Location: Left Arm, Patient Position: Sitting, Cuff Size: Large)   Pulse 75   Ht 5\' 1"  (1.549 m)   Wt 148 lb (67.1 kg)   SpO2 99%   BMI 27.96 kg/m     EXAM: General: Pt appears well and is in NAD  Neck: General: Supple without adenopathy. Thyroid: No goiter or nodules appreciated.  Lungs: Clear with good BS bilat   Heart: Auscultation: RRR.  Abdomen: soft, nontender  Extremities:  BL LE: No pretibial edema normal ROM and strength.  Mental Status: Judgment, insight: Intact Orientation: Oriented to time, place, and  person Mood and affect: No depression, anxiety, or agitation     DATA REVIEWED:  Latest Reference Range & Units 05/13/22 09:14  TSH 0.35 - 5.50 uIU/mL 0.61  T4,Free(Direct) 0.60 - 1.60 ng/dL 0.93    Results for CALEDONIA, WHELAN (MRN [id number]) as of 06/02/2018 07:55  Ref. Range 03/03/2018 09:10  TRAB Latest Ref Range: <=2.00 IU/L 1.06    Latest Reference Range & Units 02/20/22 09:30  Sodium 134 - 144 mmol/L 140  Potassium 3.5 - 5.2 mmol/L 4.1  Chloride 96 - 106 mmol/L 101  CO2 20 - 29 mmol/L 20  Glucose 70 - 99 mg/dL 998 (H)  BUN 6 - 20 mg/dL 14  Creatinine 3.38 - 2.50 mg/dL 5.39  Calcium 8.7 - 76.7 mg/dL 9.6  BUN/Creatinine Ratio 9 - 23  19  eGFR >59 mL/min/1.73 110  Alkaline Phosphatase 44 - 121 IU/L 63  Albumin 3.9 - 4.9 g/dL 5.0 (H)  Albumin/Globulin Ratio 1.2 - 2.2  1.7  AST 0 - 40 IU/L 16  ALT 0 - 32 IU/L 18  Total Protein 6.0 - 8.5 g/dL 7.9  Total Bilirubin 0.0 - 1.2 mg/dL 0.4  (H): Data is abnormally high   Thyroid Ultrasound 03/17/2018 There are no discrete nodules which meet criteria for biopsy nor follow-up. The right lobe is larger than the left.     ASSESSMENT / PLAN / RECOMMENDATIONS:   Hyperthyroidism :  - She continues to be clinically euthyroid  - TFT's  -Differential diagnosis includes Graves' disease versus autonomous thyroid nodules  Medication  Continue methimazole 5 mg, 1 tabs daily    F/u in 6 months     Signed electronically by: Lyndle Herrlich, MD  [facility] Endocrinology  [facility] [address] [place] [place], [place] [address] Phone: [phone] FAX: [phone]      CC: Claiborne Rigg, NP [address] [place] [address] [place] [address] Phone: [phone]  Fax: [phone]   Return to Endocrinology clinic as below: Future Appointments  Date Time Provider Department Center  05/22/2022  9:10 AM Claiborne Rigg, NP [facility] None  11/18/2022  9:10 AM Josseline Reddin, Konrad Dolores, MD  [facility] None

## 2022-05-15 ENCOUNTER — Other Ambulatory Visit: Payer: Self-pay

## 2022-05-16 ENCOUNTER — Other Ambulatory Visit: Payer: Self-pay

## 2022-05-22 ENCOUNTER — Other Ambulatory Visit: Payer: Self-pay

## 2022-05-22 ENCOUNTER — Encounter: Payer: Self-pay | Admitting: Nurse Practitioner

## 2022-05-22 ENCOUNTER — Ambulatory Visit: Payer: Medicaid Other | Attending: Nurse Practitioner | Admitting: Nurse Practitioner

## 2022-05-22 VITALS — BP 107/74 | HR 78 | Ht 61.0 in | Wt 148.0 lb

## 2022-05-22 DIAGNOSIS — E1165 Type 2 diabetes mellitus with hyperglycemia: Secondary | ICD-10-CM

## 2022-05-22 DIAGNOSIS — E785 Hyperlipidemia, unspecified: Secondary | ICD-10-CM

## 2022-05-22 DIAGNOSIS — Z7984 Long term (current) use of oral hypoglycemic drugs: Secondary | ICD-10-CM

## 2022-05-22 LAB — POCT GLYCOSYLATED HEMOGLOBIN (HGB A1C): Hemoglobin A1C: 7.2 % — AB (ref 4.0–5.6)

## 2022-05-22 MED ORDER — GLIMEPIRIDE 4 MG PO TABS
4.0000 mg | ORAL_TABLET | Freq: Every day | ORAL | 1 refills | Status: DC
Start: 2022-05-22 — End: 2022-08-28
  Filled 2022-05-22 – 2022-08-14 (×2): qty 90, 90d supply, fill #0

## 2022-05-22 NOTE — Progress Notes (Signed)
Assessment & Plan:  [name] was seen today for diabetes.  Diagnoses and all orders for this visit:  Type 2 diabetes mellitus with hyperglycemia, without long-term current use of insulin -     POCT glycosylated hemoglobin (Hb A1C) -     glimepiride (AMARYL) 4 MG tablet; Take 1 tablet (4 mg total) by mouth daily before breakfast. -     CMP14+EGFR Continue blood sugar control as discussed in office today, low carbohydrate diet, and regular physical exercise as tolerated, 150 minutes per week (30 min each day, 5 days per week, or 50 min 3 days per week). Keep blood sugar logs with fasting goal of 90-130 mg/dl, post prandial (after you eat) less than 180.  For Hypoglycemia: BS <60 and Hyperglycemia BS >400; contact the clinic ASAP.    Dyslipidemia -     Lipid panel INSTRUCTIONS: Work on a low fat, heart healthy diet and participate in regular aerobic exercise program by working out at least 150 minutes per week; 5 days a week-30 minutes per day. Avoid red meat/beef/steak,  fried foods. junk foods, sodas, sugary drinks, unhealthy snacking, alcohol and smoking.  Drink at least 80 oz of water per day and monitor your carbohydrate intake daily.      Patient has been counseled on age-appropriate routine health concerns for screening and prevention. These are reviewed and up-to-date. Referrals have been placed accordingly. Immunizations are up-to-date or declined.    Subjective:   Chief Complaint  Patient presents with   Diabetes   Diabetes Pertinent negatives for hypoglycemia include no dizziness, headaches or seizures. Pertinent negatives for diabetes include no blurred vision, no chest pain and no weight loss.   Veronica Love 37 y.o. female presents to office today to for follow up to DM and HPL  DM 2 A1c improving and down to 7.2 from 7.7 today. Glimepiride was increased to 4 mg at her last visit. No changes today.  She will continue on janumet 50-1000 mg BID and jardiance 25 mg daily. She is  taking a renal dose ACE Lab Results  Component Value Date   HGBA1C 7.2 (A) 05/22/2022  LDL not at goal. Prescribed atorvastatin 20 mg daily.  Lab Results  Component Value Date   LDLCALC 130 (H) 02/20/2022     Blood pressure is well controlled BP Readings from Last 3 Encounters:  05/22/22 107/74  05/13/22 118/74  02/20/22 119/83     Review of Systems  Constitutional:  Negative for fever, malaise/fatigue and weight loss.  HENT: Negative.  Negative for nosebleeds.   Eyes: Negative.  Negative for blurred vision, double vision and photophobia.  Respiratory: Negative.  Negative for cough and shortness of breath.   Cardiovascular: Negative.  Negative for chest pain, palpitations and leg swelling.  Gastrointestinal: Negative.  Negative for heartburn, nausea and vomiting.  Musculoskeletal: Negative.  Negative for myalgias.  Neurological: Negative.  Negative for dizziness, focal weakness, seizures and headaches.  Psychiatric/Behavioral: Negative.  Negative for suicidal ideas.     Past Medical History:  Diagnosis Date   Diabetes mellitus, type 2    Gestational diabetes 2013   History of positive PPD 2009   with negative chest x-ray   Hypercholesteremia    No pertinent past medical history    Subclinical hyperthyroidism     Past Surgical History:  Procedure Laterality Date   NO PAST SURGERIES      Family History  Problem Relation Age of Onset   Diabetes Father  Social History Reviewed with no changes to be made today.   Outpatient Medications Prior to Visit  Medication Sig Dispense Refill   Accu-Chek Softclix Lancets lancets Use as instructed 100 each 12   atorvastatin (LIPITOR) 20 MG tablet Take 1 tablet (20 mg total) by mouth daily. 90 tablet 3   Blood Glucose Monitoring Suppl (ACCU-CHEK GUIDE) w/Device KIT 1 each by Does not apply route daily. 1 kit 0   cetirizine (ZYRTEC) 10 MG tablet Take 1 tablet (10 mg total) by mouth daily. 90 tablet 3   empagliflozin  (JARDIANCE) 25 MG TABS tablet Take 1 tablet (25 mg total) by mouth daily. 90 tablet 1   etonogestrel (NEXPLANON) 68 MG IMPL implant Inject 1 each (68 mg total) into the skin once. 1 each 0   glucose blood (ACCU-CHEK GUIDE) test strip USE AS INSTRUCTED. CHECK BLOOD GLUCOSE LEVEL BY FINGERSTICK TWICE PER DAY. 100 strip 2   lisinopril (ZESTRIL) 2.5 MG tablet Take 1 tablet (2.5 mg total) by mouth daily. 90 tablet 1   methimazole (TAPAZOLE) 5 MG tablet Take 1 tablet (5 mg total) by mouth daily. 90 tablet 3   sitaGLIPtin-metformin (JANUMET) 50-500 MG tablet Take 1 tablet by mouth 2 (two) times daily with a meal. 180 tablet 1   glimepiride (AMARYL) 4 MG tablet Take 1 tablet (4 mg total) by mouth daily before breakfast. 90 tablet 1   No facility-administered medications prior to visit.    Allergies  Allergen Reactions   Metformin And Related     Can not take 1000 mg of metformin as it causes nausea and headaches.   Victoza [Liraglutide] Other (See Comments)    Dizziness       Objective:    BP 107/74 (BP Location: Left Arm, Patient Position: Sitting, Cuff Size: Small)   Pulse 78   Ht  (1.549 m)   Wt 148 lb (67.1 kg)   LMP  (LMP Unknown) Comment: nexpalon  SpO2 98%   BMI 27.96 kg/m  Wt Readings from Last 3 Encounters:  05/22/22 148 lb (67.1 kg)  05/13/22 148 lb (67.1 kg)  02/20/22 148 lb (67.1 kg)    Physical Exam Vitals and nursing note reviewed.  Constitutional:      Appearance: She is well-developed.  HENT:     Head: Normocephalic and atraumatic.  Cardiovascular:     Rate and Rhythm: Normal rate and regular rhythm.     Heart sounds: Normal heart sounds. No murmur heard.    No friction rub. No gallop.  Pulmonary:     Effort: Pulmonary effort is normal. No tachypnea or respiratory distress.     Breath sounds: Normal breath sounds. No decreased breath sounds, wheezing, rhonchi or rales.  Chest:     Chest wall: No tenderness.  Abdominal:     General: Bowel sounds are  normal.     Palpations: Abdomen is soft.  Musculoskeletal:        General: Normal range of motion.     Cervical back: Normal range of motion.  Skin:    General: Skin is warm and dry.  Neurological:     Mental Status: She is alert and oriented to person, place, and time.     Coordination: Coordination normal.  Psychiatric:        Behavior: Behavior normal. Behavior is cooperative.        Thought Content: Thought content normal.        Judgment: Judgment normal.  Patient has been counseled extensively about nutrition and exercise as well as the importance of adherence with medications and regular follow-up. The patient was given clear instructions to go to ER or return to medical center if symptoms don't improve, worsen or new problems develop. The patient verbalized understanding.   Follow-up: Return in about 3 months (around 08/21/2022).   Claiborne Rigg, FNP-BC [facility] and [facility] [place], [place] [phone]   05/22/2022, 9:31 AM

## 2022-05-23 LAB — CMP14+EGFR
ALT: 20 IU/L (ref 0–32)
AST: 19 IU/L (ref 0–40)
Albumin/Globulin Ratio: 1.7 (ref 1.2–2.2)
Albumin: 4.8 g/dL (ref 3.9–4.9)
Alkaline Phosphatase: 58 IU/L (ref 44–121)
BUN/Creatinine Ratio: 21 (ref 9–23)
BUN: 14 mg/dL (ref 6–20)
Bilirubin Total: 0.3 mg/dL (ref 0.0–1.2)
CO2: 21 mmol/L (ref 20–29)
Calcium: 9.6 mg/dL (ref 8.7–10.2)
Chloride: 103 mmol/L (ref 96–106)
Creatinine, Ser: 0.68 mg/dL (ref 0.57–1.00)
Globulin, Total: 2.9 g/dL (ref 1.5–4.5)
Glucose: 96 mg/dL (ref 70–99)
Potassium: 4 mmol/L (ref 3.5–5.2)
Sodium: 139 mmol/L (ref 134–144)
Total Protein: 7.7 g/dL (ref 6.0–8.5)
eGFR: 114 mL/min/{1.73_m2} (ref >59)

## 2022-05-23 LAB — LIPID PANEL
Chol/HDL Ratio: 3.7 ratio (ref 0.0–4.4)
Cholesterol, Total: 157 mg/dL (ref 100–199)
HDL: 43 mg/dL (ref >39)
LDL Chol Calc (NIH): 89 mg/dL (ref 0–99)
Triglycerides: 142 mg/dL (ref 0–149)
VLDL Cholesterol Cal: 25 mg/dL (ref 5–40)

## 2022-05-29 DIAGNOSIS — Z419 Encounter for procedure for purposes other than remedying health state, unspecified: Secondary | ICD-10-CM | POA: Diagnosis not present

## 2022-06-19 ENCOUNTER — Other Ambulatory Visit: Payer: Self-pay

## 2022-06-29 DIAGNOSIS — Z419 Encounter for procedure for purposes other than remedying health state, unspecified: Secondary | ICD-10-CM | POA: Diagnosis not present

## 2022-07-15 ENCOUNTER — Other Ambulatory Visit: Payer: Self-pay

## 2022-07-16 ENCOUNTER — Other Ambulatory Visit: Payer: Self-pay

## 2022-07-23 ENCOUNTER — Other Ambulatory Visit: Payer: Self-pay

## 2022-07-24 ENCOUNTER — Other Ambulatory Visit: Payer: Self-pay

## 2022-07-29 DIAGNOSIS — Z419 Encounter for procedure for purposes other than remedying health state, unspecified: Secondary | ICD-10-CM | POA: Diagnosis not present

## 2022-08-14 ENCOUNTER — Other Ambulatory Visit: Payer: Self-pay

## 2022-08-15 ENCOUNTER — Other Ambulatory Visit: Payer: Self-pay

## 2022-08-19 ENCOUNTER — Other Ambulatory Visit: Payer: Self-pay

## 2022-08-20 ENCOUNTER — Other Ambulatory Visit: Payer: Self-pay

## 2022-08-20 ENCOUNTER — Telehealth: Payer: Self-pay

## 2022-08-20 NOTE — Telephone Encounter (Signed)
A prior authorization request for Veronica Love has been submitted to insurance today via CoverMyMeds Key: [id number]

## 2022-08-27 ENCOUNTER — Other Ambulatory Visit: Payer: Self-pay

## 2022-08-28 ENCOUNTER — Encounter: Payer: Self-pay | Admitting: Nurse Practitioner

## 2022-08-28 ENCOUNTER — Other Ambulatory Visit: Payer: Self-pay

## 2022-08-28 ENCOUNTER — Ambulatory Visit: Payer: Medicaid Other | Attending: Nurse Practitioner | Admitting: Nurse Practitioner

## 2022-08-28 VITALS — BP 119/79 | HR 79 | Ht 61.0 in | Wt 149.2 lb

## 2022-08-28 DIAGNOSIS — E7800 Pure hypercholesterolemia, unspecified: Secondary | ICD-10-CM | POA: Diagnosis not present

## 2022-08-28 DIAGNOSIS — E1165 Type 2 diabetes mellitus with hyperglycemia: Secondary | ICD-10-CM

## 2022-08-28 DIAGNOSIS — Z7984 Long term (current) use of oral hypoglycemic drugs: Secondary | ICD-10-CM | POA: Diagnosis not present

## 2022-08-28 LAB — POCT GLYCOSYLATED HEMOGLOBIN (HGB A1C): Hemoglobin A1C: 7.9 % — AB (ref 4.0–5.6)

## 2022-08-28 MED ORDER — JANUMET 50-500 MG PO TABS
1.0000 | ORAL_TABLET | Freq: Two times a day (BID) | ORAL | 1 refills | Status: DC
Start: 2022-08-28 — End: 2023-06-04
  Filled 2022-08-28: qty 180, 90d supply, fill #0
  Filled 2022-11-04: qty 60, 30d supply, fill #0
  Filled 2022-11-26: qty 60, 30d supply, fill #1
  Filled 2022-11-28: qty 180, 90d supply, fill #1
  Filled 2023-04-11: qty 120, 60d supply, fill #2

## 2022-08-28 MED ORDER — ATORVASTATIN CALCIUM 40 MG PO TABS
40.0000 mg | ORAL_TABLET | Freq: Every day | ORAL | 1 refills | Status: DC
  Filled 2022-08-28: qty 90, 90d supply, fill #0
  Filled 2023-02-19 (×2): qty 90, 90d supply, fill #1

## 2022-08-28 MED ORDER — EMPAGLIFLOZIN 25 MG PO TABS
25.0000 mg | ORAL_TABLET | Freq: Every day | ORAL | 1 refills | Status: DC
Start: 2022-08-28 — End: 2023-02-19
  Filled 2022-08-28: qty 90, 90d supply, fill #0
  Filled 2022-11-26: qty 90, 90d supply, fill #1

## 2022-08-28 MED ORDER — GLIMEPIRIDE 4 MG PO TABS
4.0000 mg | ORAL_TABLET | Freq: Every day | ORAL | 1 refills | Status: DC
Start: 2022-08-28 — End: 2023-03-05
  Filled 2022-08-28 – 2022-11-26 (×2): qty 90, 90d supply, fill #0
  Filled 2023-02-19 (×2): qty 90, 90d supply, fill #1

## 2022-08-28 NOTE — Progress Notes (Signed)
Assessment & Plan:  Veronica Love was seen today for medical management of chronic issues.  Diagnoses and all orders for this visit:  Type 2 diabetes mellitus with hyperglycemia, without long-term current use of insulin (HCC) She has been instructed to increase glimepiride by half a tablet and Janumet is unavailable at the pharmacy today.   -     POCT glycosylated hemoglobin (Hb A1C) -     empagliflozin (JARDIANCE) 25 MG TABS tablet; Take 1 tablet (25 mg total) by mouth daily. -     glimepiride (AMARYL) 4 MG tablet; Take 1 tablet (4 mg total) by mouth daily before breakfast. -     sitaGLIPtin-metformin (JANUMET) 50-500 MG tablet; Take 1 tablet by mouth 2 (two) times daily with a meal. -     CMP14+EGFR  Hypercholesteremia LDL not at goal with atorvastatin 20 mg daily.  Will increase dose at this time to 40 mg -     atorvastatin (LIPITOR) 40 MG tablet; Take 1 tablet (40 mg total) by mouth daily.    Patient has been counseled on age-appropriate routine health concerns for screening and prevention. These are reviewed and up-to-date. Referrals have been placed accordingly. Immunizations are up-to-date or declined.    Subjective:   Chief Complaint  Patient presents with   Medical Management of Chronic Issues   HPI Veronica Love 37 y.o. female presents to office today for follow up to DM   She is accompanied by an onsite interpreter today.   She has a past medical history of Diabetes mellitus, type 2 (HCC), Gestational diabetes (2013), History of positive PPD (2009), Hypercholesteremia,  and Subclinical hyperthyroidism.    DM 2 A1c is slightly increased today however she reports not being able to pick up her Janumet as it is awaiting prior authorization.  She is taking glimepiride 4 mg daily and Jardiance 25 mg daily.  She is on renal dose ACE.  Blood pressure is well-controlled.  She has been instructed to increase glimepiride by half a tablet and Janumet is unavailable at the pharmacy today.   LDL not at goal with atorvastatin 20 mg daily.  Will increase dose at this time to 40 mg Lab Results  Component Value Date   HGBA1C 7.9 (A) 08/28/2022    Lab Results  Component Value Date   HGBA1C 7.2 (A) 05/22/2022    Lab Results  Component Value Date   LDLCALC 89 05/22/2022    BP Readings from Last 3 Encounters:  08/28/22 119/79  05/22/22 107/74  05/13/22 118/74    Review of Systems  Constitutional:  Negative for fever, malaise/fatigue and weight loss.  HENT: Negative.  Negative for nosebleeds.   Eyes: Negative.  Negative for blurred vision, double vision and photophobia.  Respiratory: Negative.  Negative for cough and shortness of breath.   Cardiovascular: Negative.  Negative for chest pain, palpitations and leg swelling.  Gastrointestinal: Negative.  Negative for heartburn, nausea and vomiting.  Musculoskeletal: Negative.  Negative for myalgias.  Neurological: Negative.  Negative for dizziness, focal weakness, seizures and headaches.  Psychiatric/Behavioral: Negative.  Negative for suicidal ideas.     Past Medical History:  Diagnosis Date   Diabetes mellitus, type 2 (HCC)    Gestational diabetes 2013   History of positive PPD 2009   with negative chest x-ray   Hypercholesteremia    No pertinent past medical history    Subclinical hyperthyroidism     Past Surgical History:  Procedure Laterality Date   NO PAST SURGERIES  Family History  Problem Relation Age of Onset   Diabetes Father     Social History Reviewed with no changes to be made today.   Outpatient Medications Prior to Visit  Medication Sig Dispense Refill   Accu-Chek Softclix Lancets lancets Use as instructed 100 each 12   Blood Glucose Monitoring Suppl (ACCU-CHEK GUIDE) w/Device KIT 1 each by Does not apply route daily. 1 kit 0   cetirizine (ZYRTEC) 10 MG tablet Take 1 tablet (10 mg total) by mouth daily. 90 tablet 3   etonogestrel (NEXPLANON) 68 MG IMPL implant Inject 1 each (68 mg total)  into the skin once. 1 each 0   glucose blood (ACCU-CHEK GUIDE) test strip USE AS INSTRUCTED. CHECK BLOOD GLUCOSE LEVEL BY FINGERSTICK TWICE PER DAY. 100 strip 2   lisinopril (ZESTRIL) 2.5 MG tablet Take 1 tablet (2.5 mg total) by mouth daily. 90 tablet 1   methimazole (TAPAZOLE) 5 MG tablet Take 1 tablet (5 mg total) by mouth daily. 90 tablet 3   atorvastatin (LIPITOR) 20 MG tablet Take 1 tablet (20 mg total) by mouth daily. 90 tablet 3   empagliflozin (JARDIANCE) 25 MG TABS tablet Take 1 tablet (25 mg total) by mouth daily. 90 tablet 1   glimepiride (AMARYL) 4 MG tablet Take 1 tablet (4 mg total) by mouth daily before breakfast. 90 tablet 1   sitaGLIPtin-metformin (JANUMET) 50-500 MG tablet Take 1 tablet by mouth 2 (two) times daily with a meal. 180 tablet 1   No facility-administered medications prior to visit.    Allergies  Allergen Reactions   Metformin And Related     Can not take 1000 mg of metformin as it causes nausea and headaches.   Victoza [Liraglutide] Other (See Comments)    Dizziness       Objective:    BP 119/79 (BP Location: Left Arm, Patient Position: Sitting, Cuff Size: Normal)   Pulse 79   Ht 5\' 1"  (1.549 m)   Wt 149 lb 3.2 oz (67.7 kg)   LMP  (LMP Unknown)   SpO2 98%   BMI 28.19 kg/m  Wt Readings from Last 3 Encounters:  08/28/22 149 lb 3.2 oz (67.7 kg)  05/22/22 148 lb (67.1 kg)  05/13/22 148 lb (67.1 kg)    Physical Exam Vitals and nursing note reviewed.  Constitutional:      Appearance: She is well-developed.  HENT:     Head: Normocephalic and atraumatic.  Cardiovascular:     Rate and Rhythm: Normal rate and regular rhythm.     Heart sounds: Normal heart sounds. No murmur heard.    No friction rub. No gallop.  Pulmonary:     Effort: Pulmonary effort is normal. No tachypnea or respiratory distress.     Breath sounds: Normal breath sounds. No decreased breath sounds, wheezing, rhonchi or rales.  Chest:     Chest wall: No tenderness.  Abdominal:      General: Bowel sounds are normal.     Palpations: Abdomen is soft.  Musculoskeletal:        General: Normal range of motion.     Cervical back: Normal range of motion.  Skin:    General: Skin is warm and dry.  Neurological:     Mental Status: She is alert and oriented to person, place, and time.     Coordination: Coordination normal.  Psychiatric:        Behavior: Behavior normal. Behavior is cooperative.        Thought Content: Thought  content normal.        Judgment: Judgment normal.          Patient has been counseled extensively about nutrition and exercise as well as the importance of adherence with medications and regular follow-up. The patient was given clear instructions to go to ER or return to medical center if symptoms don't improve, worsen or new problems develop. The patient verbalized understanding.   Follow-up: Return in about 3 months (around 11/28/2022).   Claiborne Rigg, FNP-BC [facility] and [facility], [place] [phone]   08/28/2022, 9:01 AM

## 2022-08-29 DIAGNOSIS — Z419 Encounter for procedure for purposes other than remedying health state, unspecified: Secondary | ICD-10-CM | POA: Diagnosis not present

## 2022-09-29 DIAGNOSIS — Z419 Encounter for procedure for purposes other than remedying health state, unspecified: Secondary | ICD-10-CM | POA: Diagnosis not present

## 2022-10-29 DIAGNOSIS — Z419 Encounter for procedure for purposes other than remedying health state, unspecified: Secondary | ICD-10-CM | POA: Diagnosis not present

## 2022-11-04 ENCOUNTER — Other Ambulatory Visit: Payer: Self-pay

## 2022-11-05 ENCOUNTER — Other Ambulatory Visit: Payer: Self-pay

## 2022-11-18 ENCOUNTER — Encounter: Payer: Self-pay | Admitting: Internal Medicine

## 2022-11-18 ENCOUNTER — Ambulatory Visit (INDEPENDENT_AMBULATORY_CARE_PROVIDER_SITE_OTHER): Payer: Medicaid Other | Admitting: Internal Medicine

## 2022-11-18 ENCOUNTER — Telehealth: Payer: Self-pay | Admitting: Internal Medicine

## 2022-11-18 ENCOUNTER — Other Ambulatory Visit: Payer: Self-pay

## 2022-11-18 VITALS — BP 124/82 | HR 88 | Ht 61.0 in | Wt 148.0 lb

## 2022-11-18 DIAGNOSIS — E0590 Thyrotoxicosis, unspecified without thyrotoxic crisis or storm: Secondary | ICD-10-CM

## 2022-11-18 LAB — TSH: TSH: 0.23 u[IU]/mL — ABNORMAL LOW (ref 0.35–5.50)

## 2022-11-18 LAB — T4, FREE: Free T4: 0.91 ng/dL (ref 0.60–1.60)

## 2022-11-18 MED ORDER — METHIMAZOLE 5 MG PO TABS
5.0000 mg | ORAL_TABLET | Freq: Every day | ORAL | 3 refills | Status: DC
  Filled 2022-11-18 – 2023-02-19 (×3): qty 90, 90d supply, fill #0
  Filled 2023-05-23: qty 90, 90d supply, fill #1

## 2022-11-18 NOTE — Telephone Encounter (Signed)
Call attempted through the interpreter line and someone answered the phone for the patient and then hung up after we introduced ourselves.

## 2022-11-18 NOTE — Telephone Encounter (Signed)
Please contact the patient through the interpreter line and ask her to start using a pillbox for the methimazole.   Thyroid has been stable on the same dose for years, so I find it odd that all of a sudden her thyroid is slightly overactive.   Please encourage the patient to use a pillbox that way we guarantee that she takes methimazole 1 tablet daily   Please reschedule for a lab appointment in 3 months    Thanks

## 2022-11-18 NOTE — Progress Notes (Signed)
Name: Veronica Love  MRN/ DOB: [id number], 12/03/1985    Age/ Sex: 37 y.o., female     PCP: Claiborne Rigg, NP   Reason for Endocrinology Evaluation: Low TSH      Initial Endocrinology Clinic Visit: 03/03/2018    PATIENT IDENTIFIER: Veronica Love is a 37 y.o., female with a past medical history of  Dyslipidemia and T2DM.Marland Kitchen She has followed with [facility] since 03/03/2018 for consultative assistance with management of her low TSH    HISTORICAL SUMMARY:  Pt was noted to have low TSH at 0.232 uIU/mL during routine lab workup in 08/2017. Repeat labs in 11/2017 confirmed similar results.  Thyroid ultrasound in 02/2018 showed no evidence of thyroid nodules.   An order for thyroid uptake and scan was order in 05/2019 but this has not been done as the pt did not know where to go   Methimazole was started 11/2019  SUBJECTIVE:   Today (11/18/2022):  Veronica Love is here for a follow up on subclinical hyperthyroidism.   Weight remains stable Denies local neck swelling  Denies palpitations  Denies tremors  Denies  constipation or diarrhea    She is on Nexplanon   Methimazole 5 mg daily      HISTORY:  Past Medical History:  Past Medical History:  Diagnosis Date   Diabetes mellitus, type 2 (HCC)    Gestational diabetes 2013   History of positive PPD 2009   with negative chest x-ray   Hypercholesteremia    No pertinent past medical history    Subclinical hyperthyroidism    Past Surgical History:  Past Surgical History:  Procedure Laterality Date   NO PAST SURGERIES     Social History:  reports that she has never smoked. She has never used smokeless tobacco. She reports that she does not drink alcohol and does not use drugs. Family History:  Family History  Problem Relation Age of Onset   Diabetes Father      HOME MEDICATIONS: Allergies as of 11/18/2022       Reactions   Metformin And Related    Can not take 1000 mg of metformin as it causes nausea and  headaches.   Victoza [liraglutide] Other (See Comments)   Dizziness        Medication List        Accurate as of November 18, 2022  9:07 AM. If you have any questions, ask your nurse or doctor.          Accu-Chek Guide test strip Generic drug: glucose blood USE AS INSTRUCTED. CHECK BLOOD GLUCOSE LEVEL BY FINGERSTICK TWICE PER DAY.   Accu-Chek Guide w/Device Kit 1 each by Does not apply route daily.   Accu-Chek Softclix Lancets lancets Use as instructed   atorvastatin 40 MG tablet Commonly known as: LIPITOR Take 1 tablet (40 mg total) by mouth daily.   cetirizine 10 MG tablet Commonly known as: ZYRTEC Take 1 tablet (10 mg total) by mouth daily.   etonogestrel 68 MG Impl implant Commonly known as: Nexplanon Inject 1 each (68 mg total) into the skin once.   glimepiride 4 MG tablet Commonly known as: AMARYL Take 1 tablet (4 mg total) by mouth daily before breakfast.   Janumet 50-500 MG tablet Generic drug: sitaGLIPtin-metformin Take 1 tablet by mouth 2 (two) times daily with a meal.   Jardiance 25 MG Tabs tablet Generic drug: empagliflozin Take 1 tablet (25 mg total) by mouth daily.   lisinopril 2.5 MG  tablet Commonly known as: ZESTRIL Take 1 tablet (2.5 mg total) by mouth daily.   methimazole 5 MG tablet Commonly known as: TAPAZOLE Take 1 tablet (5 mg total) by mouth daily.          OBJECTIVE:   PHYSICAL EXAM: VS: BP 124/82 (BP Location: Left Arm, Patient Position: Sitting, Cuff Size: Large)   Pulse 88   Ht 5\' 1"  (1.549 m)   Wt 148 lb (67.1 kg)   SpO2 98%   BMI 27.96 kg/m     EXAM: General: Pt appears well and is in NAD  Neck: General: Supple without adenopathy. Thyroid: No goiter or nodules appreciated.  Lungs: Clear with good BS bilat   Heart: Auscultation: RRR.  Abdomen: soft, nontender  Extremities:  BL LE: No pretibial edema normal ROM and strength.  Mental Status: Judgment, insight: Intact Orientation: Oriented to time, place,  and person Mood and affect: No depression, anxiety, or agitation     DATA REVIEWED:   Latest Reference Range & Units 08/28/22 08:59  Sodium 134 - 144 mmol/L 138  Potassium 3.5 - 5.2 mmol/L 4.4  Chloride 96 - 106 mmol/L 100  CO2 20 - 29 mmol/L 21  Glucose 70 - 99 mg/dL 130 (H)  BUN 6 - 20 mg/dL 13  Creatinine 8.65 - 7.84 mg/dL 6.96  Calcium 8.7 - 29.5 mg/dL 9.9  BUN/Creatinine Ratio 9 - 23  17  eGFR >59 mL/min/1.73 104  Alkaline Phosphatase 44 - 121 IU/L 66  Albumin 3.9 - 4.9 g/dL 4.7  AST 0 - 40 IU/L 20  ALT 0 - 32 IU/L 26  Total Protein 6.0 - 8.5 g/dL 8.0  Total Bilirubin 0.0 - 1.2 mg/dL 0.3    Latest Reference Range & Units 11/18/22 09:48  TSH 0.35 - 5.50 uIU/mL 0.23 (L)  T4,Free(Direct) 0.60 - 1.60 ng/dL 2.84  (L): Data is abnormally low       Results for Veronica Love, Veronica Love (MRN [id number]) as of 06/02/2018 07:55  Ref. Range 03/03/2018 09:10  [facility] Latest Ref Range: <=2.00 IU/L 1.06    Thyroid Ultrasound 03/17/2018 There are no discrete nodules which meet criteria for biopsy nor follow-up. The right lobe is larger than the left.     ASSESSMENT / PLAN / RECOMMENDATIONS:   Hyperthyroidism :  - She continues to be clinically euthyroid  - TFT's showed low TSH, I am concerned about imperfect adherence to methimazole -Patient will be encouraged to use a pillbox to assure compliance, recheck TFTs in 3 months -Differential diagnosis includes Graves' disease versus autonomous thyroid nodules  Medication  Continue methimazole 5 mg, 1 tabs daily    F/u in 1 yr     Signed electronically by: Lyndle Herrlich, MD  [facility]  [facility] [address] [place] [place], [place] [address] Phone: [phone] FAX: [phone]      CC: Claiborne Rigg, NP [address] [place] [address] [place] [address] Phone: [phone]  Fax: [phone]   Return to Endocrinology clinic as below: Future Appointments  Date Time Provider  Department Center  11/18/2022  9:10 AM Sandeep Radell, Konrad Dolores, MD [id number] None  12/03/2022  9:50 AM Claiborne Rigg, NP [id number] None

## 2022-11-20 NOTE — Telephone Encounter (Signed)
Left vm with interpreter service

## 2022-11-21 NOTE — Telephone Encounter (Signed)
Information was given to patient spouse and he verbalized understanding. Interpreter was on the line but husband didn't need service.  Lab appointment has been scheduled

## 2022-11-26 ENCOUNTER — Other Ambulatory Visit: Payer: Self-pay

## 2022-11-28 ENCOUNTER — Other Ambulatory Visit: Payer: Self-pay

## 2022-11-29 DIAGNOSIS — Z419 Encounter for procedure for purposes other than remedying health state, unspecified: Secondary | ICD-10-CM | POA: Diagnosis not present

## 2022-12-02 ENCOUNTER — Ambulatory Visit: Payer: Medicaid Other | Admitting: Nurse Practitioner

## 2022-12-03 ENCOUNTER — Encounter: Payer: Self-pay | Admitting: Nurse Practitioner

## 2022-12-03 ENCOUNTER — Ambulatory Visit: Payer: Medicaid Other | Attending: Nurse Practitioner | Admitting: Nurse Practitioner

## 2022-12-03 VITALS — BP 122/85 | HR 74 | Ht 61.0 in | Wt 147.8 lb

## 2022-12-03 DIAGNOSIS — Z23 Encounter for immunization: Secondary | ICD-10-CM

## 2022-12-03 DIAGNOSIS — R7989 Other specified abnormal findings of blood chemistry: Secondary | ICD-10-CM

## 2022-12-03 DIAGNOSIS — Z7984 Long term (current) use of oral hypoglycemic drugs: Secondary | ICD-10-CM | POA: Diagnosis not present

## 2022-12-03 DIAGNOSIS — E1165 Type 2 diabetes mellitus with hyperglycemia: Secondary | ICD-10-CM | POA: Diagnosis not present

## 2022-12-03 LAB — POCT GLYCOSYLATED HEMOGLOBIN (HGB A1C): Hemoglobin A1C: 7.6 % — AB (ref 4.0–5.6)

## 2022-12-03 NOTE — Progress Notes (Signed)
Assessment & Plan:  Veronica Love was seen today for diabetes.  Diagnoses and all orders for this visit:  Type 2 diabetes mellitus with hyperglycemia, without long-term current use of insulin (HCC) -     POCT glycosylated hemoglobin (Hb A1C) -     Urine Albumin/Creatinine with ratio (send out) [LAB689] -     Ambulatory referral to Ophthalmology -     CMP14+EGFR Continue all antihypertensives as prescribed.  Reminded to bring in blood pressure log for follow  up appointment.  RECOMMENDATIONS: DASH/Mediterranean Diets are healthier choices for HTN.    Abnormal CBC -     CBC with Differential  Encounter for immunization -     Flu vaccine trivalent PF, 6mos and older(Flulaval,Afluria,Fluarix,Fluzone) -     Tdap vaccine greater than or equal to 7yo IM    Patient has been counseled on age-appropriate routine health concerns for screening and prevention. These are reviewed and up-to-date. Referrals have been placed accordingly. Immunizations are up-to-date or declined.    Subjective:   Chief Complaint  Patient presents with   Diabetes    Veronica Love 37 y.o. female presents to office today for follow up to DM  She has a past medical history of Diabetes mellitus, type 2, Gestational diabetes (2013), History of positive PPD (2009), Hypercholesteremia, and Subclinical hyperthyroidism (followed by endo).     She has her meter with her today. Average readings as follows: 7 day average 149 14 day average 144 30 day average 144 90 day average 153 A1C improved and down from 7.9 to 7.6. She is currently prescribed jardiance 25 mg daily, glimepiride 4 mg daily and janumet 50-500 mg BID.  She is prescribed renal dose ACE Lab Results  Component Value Date   HGBA1C 7.6 (A) 12/03/2022  LDL not at goal. She has been  prescribed atorvastatin 40 mg daily.  Lab Results  Component Value Date   LDLCALC 89 05/22/2022       Review of Systems  Constitutional:  Negative for fever, malaise/fatigue and  weight loss.  HENT: Negative.  Negative for nosebleeds.   Eyes: Negative.  Negative for blurred vision, double vision and photophobia.  Respiratory: Negative.  Negative for cough and shortness of breath.   Cardiovascular: Negative.  Negative for chest pain, palpitations and leg swelling.  Gastrointestinal: Negative.  Negative for heartburn, nausea and vomiting.  Musculoskeletal: Negative.  Negative for myalgias.  Neurological: Negative.  Negative for dizziness, focal weakness, seizures and headaches.  Psychiatric/Behavioral: Negative.  Negative for suicidal ideas.     Past Medical History:  Diagnosis Date   Diabetes mellitus, type 2 (HCC)    Gestational diabetes 2013   History of positive PPD 2009   with negative chest x-ray   Hypercholesteremia    No pertinent past medical history    Subclinical hyperthyroidism     Past Surgical History:  Procedure Laterality Date   NO PAST SURGERIES      Family History  Problem Relation Age of Onset   Diabetes Father     Social History Reviewed with no changes to be made today.   Outpatient Medications Prior to Visit  Medication Sig Dispense Refill   Accu-Chek Softclix Lancets lancets Use as instructed 100 each 12   atorvastatin (LIPITOR) 40 MG tablet Take 1 tablet (40 mg total) by mouth daily. 90 tablet 1   Blood Glucose Monitoring Suppl (ACCU-CHEK GUIDE) w/Device KIT 1 each by Does not apply route daily. 1 kit 0   cetirizine (ZYRTEC)  10 MG tablet Take 1 tablet (10 mg total) by mouth daily. 90 tablet 3   empagliflozin (JARDIANCE) 25 MG TABS tablet Take 1 tablet (25 mg total) by mouth daily. 90 tablet 1   etonogestrel (NEXPLANON) 68 MG IMPL implant Inject 1 each (68 mg total) into the skin once. 1 each 0   glimepiride (AMARYL) 4 MG tablet Take 1 tablet (4 mg total) by mouth daily before breakfast. 90 tablet 1   glucose blood (ACCU-CHEK GUIDE) test strip USE AS INSTRUCTED. CHECK BLOOD GLUCOSE LEVEL BY FINGERSTICK TWICE PER DAY. 100 strip 2    lisinopril (ZESTRIL) 2.5 MG tablet Take 1 tablet (2.5 mg total) by mouth daily. 90 tablet 1   methimazole (TAPAZOLE) 5 MG tablet Take 1 tablet (5 mg total) by mouth daily. 90 tablet 3   sitaGLIPtin-metformin (JANUMET) 50-500 MG tablet Take 1 tablet by mouth 2 (two) times daily with a meal. 180 tablet 1   No facility-administered medications prior to visit.    Allergies  Allergen Reactions   Metformin And Related     Can not take 1000 mg of metformin as it causes nausea and headaches.   Victoza [Liraglutide] Other (See Comments)    Dizziness       Objective:    BP 122/85 (BP Location: Left Arm, Patient Position: Sitting, Cuff Size: Normal)   Pulse 74   Ht 5\' 1"  (1.549 m)   Wt 147 lb 12.8 oz (67 kg)   SpO2 100%   BMI 27.93 kg/m  Wt Readings from Last 3 Encounters:  12/03/22 147 lb 12.8 oz (67 kg)  11/18/22 148 lb (67.1 kg)  08/28/22 149 lb 3.2 oz (67.7 kg)    Physical Exam Vitals and nursing note reviewed.  Constitutional:      Appearance: She is well-developed.  HENT:     Head: Normocephalic and atraumatic.  Cardiovascular:     Rate and Rhythm: Normal rate and regular rhythm.     Heart sounds: Normal heart sounds. No murmur heard.    No friction rub. No gallop.  Pulmonary:     Effort: Pulmonary effort is normal. No tachypnea or respiratory distress.     Breath sounds: Normal breath sounds. No decreased breath sounds, wheezing, rhonchi or rales.  Chest:     Chest wall: No tenderness.  Abdominal:     General: Bowel sounds are normal.     Palpations: Abdomen is soft.  Musculoskeletal:        General: Normal range of motion.     Cervical back: Normal range of motion.  Skin:    General: Skin is warm and dry.  Neurological:     Mental Status: She is alert and oriented to person, place, and time.     Coordination: Coordination normal.  Psychiatric:        Behavior: Behavior normal. Behavior is cooperative.        Thought Content: Thought content normal.         Judgment: Judgment normal.          Patient has been counseled extensively about nutrition and exercise as well as the importance of adherence with medications and regular follow-up. The patient was given clear instructions to go to ER or return to medical center if symptoms don't improve, worsen or new problems develop. The patient verbalized understanding.   Follow-up: Return in about 3 months (around 03/05/2023).   Claiborne Rigg, FNP-BC [facility] and [facility] [place], [place] [phone]  12/15/2022, 10:07 PM

## 2022-12-06 LAB — CMP14+EGFR
ALT: 20 [IU]/L (ref 0–32)
AST: 17 [IU]/L (ref 0–40)
Albumin: 4.9 g/dL (ref 3.9–4.9)
Alkaline Phosphatase: 65 [IU]/L (ref 44–121)
BUN/Creatinine Ratio: 22 (ref 9–23)
BUN: 14 mg/dL (ref 6–20)
Bilirubin Total: 0.4 mg/dL (ref 0.0–1.2)
CO2: 19 mmol/L — ABNORMAL LOW (ref 20–29)
Calcium: 10 mg/dL (ref 8.7–10.2)
Chloride: 101 mmol/L (ref 96–106)
Creatinine, Ser: 0.63 mg/dL (ref 0.57–1.00)
Globulin, Total: 3.5 g/dL (ref 1.5–4.5)
Glucose: 89 mg/dL (ref 70–99)
Potassium: 4.2 mmol/L (ref 3.5–5.2)
Sodium: 140 mmol/L (ref 134–144)
Total Protein: 8.4 g/dL (ref 6.0–8.5)
eGFR: 116 mL/min/{1.73_m2} (ref >59)

## 2022-12-06 LAB — CBC WITH DIFFERENTIAL/PLATELET
Basophils Absolute: 0.1 10*3/uL (ref 0.0–0.2)
Basos: 1 %
EOS (ABSOLUTE): 0.1 10*3/uL (ref 0.0–0.4)
Eos: 1 %
Hematocrit: 46.6 % (ref 34.0–46.6)
Hemoglobin: 14.9 g/dL (ref 11.1–15.9)
Immature Grans (Abs): 0 10*3/uL (ref 0.0–0.1)
Immature Granulocytes: 0 %
Lymphocytes Absolute: 3.2 10*3/uL — ABNORMAL HIGH (ref 0.7–3.1)
Lymphs: 36 %
MCH: 24.3 pg — ABNORMAL LOW (ref 26.6–33.0)
MCHC: 32 g/dL (ref 31.5–35.7)
MCV: 76 fL — ABNORMAL LOW (ref 79–97)
Monocytes Absolute: 0.5 10*3/uL (ref 0.1–0.9)
Monocytes: 5 %
Neutrophils Absolute: 5.2 10*3/uL (ref 1.4–7.0)
Neutrophils: 57 %
Platelets: 289 10*3/uL (ref 150–450)
RBC: 6.12 x10E6/uL — ABNORMAL HIGH (ref 3.77–5.28)
RDW: 14.7 % (ref 11.7–15.4)
WBC: 9 10*3/uL (ref 3.4–10.8)

## 2022-12-06 LAB — MICROALBUMIN / CREATININE URINE RATIO
Creatinine, Urine: 33.9 mg/dL
Microalb/Creat Ratio: <9 mg/g{creat} (ref 0–29)
Microalbumin, Urine: <3 ug/mL

## 2022-12-15 ENCOUNTER — Encounter: Payer: Self-pay | Admitting: Nurse Practitioner

## 2022-12-29 DIAGNOSIS — Z419 Encounter for procedure for purposes other than remedying health state, unspecified: Secondary | ICD-10-CM | POA: Diagnosis not present

## 2023-01-29 DIAGNOSIS — Z419 Encounter for procedure for purposes other than remedying health state, unspecified: Secondary | ICD-10-CM | POA: Diagnosis not present

## 2023-02-12 ENCOUNTER — Other Ambulatory Visit: Payer: Self-pay

## 2023-02-12 DIAGNOSIS — E0590 Thyrotoxicosis, unspecified without thyrotoxic crisis or storm: Secondary | ICD-10-CM

## 2023-02-19 ENCOUNTER — Other Ambulatory Visit: Payer: Self-pay | Admitting: Nurse Practitioner

## 2023-02-19 ENCOUNTER — Other Ambulatory Visit: Payer: Self-pay | Admitting: Family Medicine

## 2023-02-19 ENCOUNTER — Other Ambulatory Visit: Payer: Self-pay

## 2023-02-19 ENCOUNTER — Other Ambulatory Visit: Payer: Medicaid Other

## 2023-02-19 DIAGNOSIS — E1165 Type 2 diabetes mellitus with hyperglycemia: Secondary | ICD-10-CM

## 2023-02-19 DIAGNOSIS — E0590 Thyrotoxicosis, unspecified without thyrotoxic crisis or storm: Secondary | ICD-10-CM | POA: Diagnosis not present

## 2023-02-19 MED ORDER — EMPAGLIFLOZIN 25 MG PO TABS
25.0000 mg | ORAL_TABLET | Freq: Every day | ORAL | 0 refills | Status: DC
  Filled 2023-02-19: qty 90, 90d supply, fill #0

## 2023-02-19 MED ORDER — GLUCOSE BLOOD VI STRP
ORAL_STRIP | 2 refills | Status: DC
  Filled 2023-02-19: qty 100, 50d supply, fill #0
  Filled 2023-05-26: qty 100, 50d supply, fill #1
  Filled 2023-09-09: qty 100, 50d supply, fill #2

## 2023-02-20 ENCOUNTER — Other Ambulatory Visit: Payer: Self-pay

## 2023-02-20 LAB — T4, FREE: Free T4: 1.2 ng/dL (ref 0.8–1.8)

## 2023-02-20 LAB — TSH: TSH: 0.34 m[IU]/L — ABNORMAL LOW

## 2023-02-21 ENCOUNTER — Encounter: Payer: Self-pay | Admitting: Internal Medicine

## 2023-03-01 DIAGNOSIS — Z419 Encounter for procedure for purposes other than remedying health state, unspecified: Secondary | ICD-10-CM | POA: Diagnosis not present

## 2023-03-05 ENCOUNTER — Other Ambulatory Visit: Payer: Self-pay

## 2023-03-05 ENCOUNTER — Ambulatory Visit: Payer: Medicaid Other | Attending: Nurse Practitioner | Admitting: Nurse Practitioner

## 2023-03-05 ENCOUNTER — Encounter: Payer: Self-pay | Admitting: Nurse Practitioner

## 2023-03-05 VITALS — BP 118/79 | HR 80 | Resp 19 | Ht 61.0 in | Wt 148.2 lb

## 2023-03-05 DIAGNOSIS — Z7984 Long term (current) use of oral hypoglycemic drugs: Secondary | ICD-10-CM

## 2023-03-05 DIAGNOSIS — E119 Type 2 diabetes mellitus without complications: Secondary | ICD-10-CM | POA: Diagnosis not present

## 2023-03-05 LAB — POCT GLYCOSYLATED HEMOGLOBIN (HGB A1C): Hemoglobin A1C: 7.6 % — AB (ref 4.0–5.6)

## 2023-03-05 MED ORDER — GLIMEPIRIDE 4 MG PO TABS
4.0000 mg | ORAL_TABLET | Freq: Two times a day (BID) | ORAL | 1 refills | Status: DC
  Filled 2023-03-05 – 2023-05-23 (×2): qty 180, 90d supply, fill #0
  Filled 2023-09-09: qty 180, 90d supply, fill #1

## 2023-03-05 NOTE — Progress Notes (Signed)
 Assessment & Plan:  [name] was seen today for diabetes.  Diagnoses and all orders for this visit:  Diabetes mellitus treated with oral medication Dose change. Increase glimepiride  to 8 mg (4mg  BID) -     POCT glycosylated hemoglobin (Hb A1C) -     glimepiride  (AMARYL ) 4 MG tablet; Take 1 tablet (4 mg total) by mouth 2 (two) times daily.    Patient has been counseled on age-appropriate routine health concerns for screening and prevention. These are reviewed and up-to-date. Referrals have been placed accordingly. Immunizations are up-to-date or declined.    Subjective:   Chief Complaint  Patient presents with   Diabetes    Veronica Love 38 y.o. female presents to office today for follow up to DM   She has a past medical history of Diabetes mellitus, type 2, Gestational diabetes (2013), History of positive PPD (2009), Hypercholesteremia, and Subclinical hyperthyroidism (followed by endo).     She is accompanied by an onsite interpreter today.    DM 2 A1c not at goal of <6.5. She has her meter with her today. Average readings as follows: 7 day average 147 14 day average 141 30 day average 148 90 day average 143 A1C improved and down from 7.9 to 7.6. She is currently prescribed jardiance  25 mg daily, glimepiride  4 mg daily and janumet  50-500 mg BID.  She is prescribed renal dose ACE Lab Results  Component Value Date   HGBA1C 7.6 (A) 03/05/2023    Lab Results  Component Value Date   HGBA1C 7.6 (A) 12/03/2022  Blood pressure at goal. She is on renal dose ACE BP Readings from Last 3 Encounters:  03/05/23 118/79  12/03/22 122/85  11/18/22 124/82     Review of Systems  Constitutional:  Negative for fever, malaise/fatigue and weight loss.  HENT:  Positive for sore throat. Negative for nosebleeds.   Eyes: Negative.  Negative for blurred vision, double vision and photophobia.  Respiratory: Negative.  Negative for cough and shortness of breath.   Cardiovascular: Negative.   Negative for chest pain, palpitations and leg swelling.  Gastrointestinal: Negative.  Negative for heartburn, nausea and vomiting.  Musculoskeletal: Negative.  Negative for myalgias.  Neurological: Negative.  Negative for dizziness, focal weakness, seizures and headaches.  Psychiatric/Behavioral: Negative.  Negative for suicidal ideas.     Past Medical History:  Diagnosis Date   Diabetes mellitus, type 2 (HCC)    Gestational diabetes 2013   History of positive PPD 2009   with negative chest x-ray   Hypercholesteremia    No pertinent past medical history    Subclinical hyperthyroidism     Past Surgical History:  Procedure Laterality Date   NO PAST SURGERIES      Family History  Problem Relation Age of Onset   Diabetes Father     Social History Reviewed with no changes to be made today.   Outpatient Medications Prior to Visit  Medication Sig Dispense Refill   Accu-Chek Softclix Lancets lancets Use as instructed 100 each 12   atorvastatin  (LIPITOR) 40 MG tablet Take 1 tablet (40 mg total) by mouth daily. 90 tablet 1   Blood Glucose Monitoring Suppl (ACCU-CHEK GUIDE) w/Device KIT 1 each by Does not apply route daily. 1 kit 0   cetirizine  (ZYRTEC ) 10 MG tablet Take 1 tablet (10 mg total) by mouth daily. 90 tablet 3   empagliflozin  (JARDIANCE ) 25 MG TABS tablet Take 1 tablet (25 mg total) by mouth daily. 90 tablet 0  etonogestrel  (NEXPLANON ) 68 MG IMPL implant Inject 1 each (68 mg total) into the skin once. 1 each 0   glucose blood test strip USE AS INSTRUCTED. CHECK BLOOD GLUCOSE LEVEL BY FINGERSTICK TWICE PER DAY. 100 strip 2   lisinopril  (ZESTRIL ) 2.5 MG tablet Take 1 tablet (2.5 mg total) by mouth daily. 90 tablet 1   methimazole  (TAPAZOLE ) 5 MG tablet Take 1 tablet (5 mg total) by mouth daily. 90 tablet 3   sitaGLIPtin -metformin  (JANUMET ) 50-500 MG tablet Take 1 tablet by mouth 2 (two) times daily with a meal. 180 tablet 1   glimepiride  (AMARYL ) 4 MG tablet Take 1 tablet (4  mg total) by mouth daily before breakfast. 90 tablet 1   No facility-administered medications prior to visit.    Allergies  Allergen Reactions   Metformin  And Related     Can not take 1000 mg of metformin  as it causes nausea and headaches.   Victoza  [Liraglutide ] Other (See Comments)    Dizziness       Objective:    BP 118/79 (BP Location: Left Arm, Patient Position: Sitting, Cuff Size: Normal)   Pulse 80   Resp 19   Ht 5' 1 (1.549 m)   Wt 148 lb 3.2 oz (67.2 kg)   LMP  (LMP Unknown)   SpO2 98%   BMI 28.00 kg/m  Wt Readings from Last 3 Encounters:  03/05/23 148 lb 3.2 oz (67.2 kg)  12/03/22 147 lb 12.8 oz (67 kg)  11/18/22 148 lb (67.1 kg)    Physical Exam Vitals and nursing note reviewed.  Constitutional:      Appearance: She is well-developed.  HENT:     Head: Normocephalic and atraumatic.  Cardiovascular:     Rate and Rhythm: Normal rate and regular rhythm.     Heart sounds: Normal heart sounds. No murmur heard.    No friction rub. No gallop.  Pulmonary:     Effort: Pulmonary effort is normal. No tachypnea or respiratory distress.     Breath sounds: Normal breath sounds. No decreased breath sounds, wheezing, rhonchi or rales.  Chest:     Chest wall: No tenderness.  Abdominal:     General: Bowel sounds are normal.     Palpations: Abdomen is soft.  Musculoskeletal:        General: Normal range of motion.     Cervical back: Normal range of motion.  Skin:    General: Skin is warm and dry.  Neurological:     Mental Status: She is alert and oriented to person, place, and time.     Coordination: Coordination normal.  Psychiatric:        Behavior: Behavior normal. Behavior is cooperative.        Thought Content: Thought content normal.        Judgment: Judgment normal.          Patient has been counseled extensively about nutrition and exercise as well as the importance of adherence with medications and regular follow-up. The patient was given clear  instructions to go to ER or return to medical center if symptoms don't improve, worsen or new problems develop. The patient verbalized understanding.   Follow-up: Return in about 3 months (around 06/02/2023).   Haze LELON Servant, FNP-BC [facility] and [facility] [place], [place] [phone]   03/05/2023, 9:45 AM

## 2023-03-05 NOTE — Addendum Note (Signed)
 Addended by: Theotis Flake F on: 03/05/2023 10:01 AM   Modules accepted: Orders

## 2023-03-06 LAB — COMPREHENSIVE METABOLIC PANEL
ALT: 21 [IU]/L (ref 0–32)
AST: 17 [IU]/L (ref 0–40)
Albumin: 4.7 g/dL (ref 3.9–4.9)
Alkaline Phosphatase: 81 [IU]/L (ref 44–121)
BUN/Creatinine Ratio: 23 (ref 9–23)
BUN: 15 mg/dL (ref 6–20)
Bilirubin Total: 0.4 mg/dL (ref 0.0–1.2)
CO2: 19 mmol/L — ABNORMAL LOW (ref 20–29)
Calcium: 9.8 mg/dL (ref 8.7–10.2)
Chloride: 102 mmol/L (ref 96–106)
Creatinine, Ser: 0.66 mg/dL (ref 0.57–1.00)
Globulin, Total: 3.8 g/dL (ref 1.5–4.5)
Glucose: 112 mg/dL — ABNORMAL HIGH (ref 70–99)
Potassium: 4.6 mmol/L (ref 3.5–5.2)
Sodium: 139 mmol/L (ref 134–144)
Total Protein: 8.5 g/dL (ref 6.0–8.5)
eGFR: 114 mL/min/{1.73_m2} (ref >59)

## 2023-03-29 DIAGNOSIS — Z419 Encounter for procedure for purposes other than remedying health state, unspecified: Secondary | ICD-10-CM | POA: Diagnosis not present

## 2023-04-11 ENCOUNTER — Other Ambulatory Visit: Payer: Self-pay | Admitting: Nurse Practitioner

## 2023-04-11 ENCOUNTER — Other Ambulatory Visit: Payer: Self-pay

## 2023-04-11 ENCOUNTER — Other Ambulatory Visit: Payer: Self-pay | Admitting: Family Medicine

## 2023-04-11 DIAGNOSIS — E1165 Type 2 diabetes mellitus with hyperglycemia: Secondary | ICD-10-CM

## 2023-04-11 DIAGNOSIS — E7800 Pure hypercholesterolemia, unspecified: Secondary | ICD-10-CM

## 2023-04-11 MED ORDER — EMPAGLIFLOZIN 25 MG PO TABS
25.0000 mg | ORAL_TABLET | Freq: Every day | ORAL | 0 refills | Status: DC
  Filled 2023-04-11 – 2023-05-23 (×2): qty 90, 90d supply, fill #0

## 2023-04-11 MED ORDER — LISINOPRIL 2.5 MG PO TABS
2.5000 mg | ORAL_TABLET | Freq: Every day | ORAL | 0 refills | Status: DC
Start: 2023-04-11 — End: 2023-06-04
  Filled 2023-04-11: qty 90, 90d supply, fill #0

## 2023-04-11 MED ORDER — ATORVASTATIN CALCIUM 40 MG PO TABS
40.0000 mg | ORAL_TABLET | Freq: Every day | ORAL | 0 refills | Status: DC
  Filled 2023-04-11 – 2023-05-23 (×2): qty 90, 90d supply, fill #0

## 2023-04-11 NOTE — Telephone Encounter (Signed)
 Requested Prescriptions  Pending Prescriptions Disp Refills   empagliflozin (JARDIANCE) 25 MG TABS tablet 90 tablet 0    Sig: Take 1 tablet (25 mg total) by mouth daily.     Endocrinology:  Diabetes - SGLT2 Inhibitors Passed - 04/11/2023  3:31 PM      Passed - Cr in normal range and within 360 days    Creat  Date Value Ref Range Status  11/30/2014 0.64 0.50 - 1.10 mg/dL Final   Creatinine, Ser  Date Value Ref Range Status  03/05/2023 0.66 0.57 - 1.00 mg/dL Final   Creatinine, Urine  Date Value Ref Range Status  04/24/2015 59 20 - 320 mg/dL Final         Passed - HBA1C is between 0 and 7.9 and within 180 days    Hemoglobin A1C  Date Value Ref Range Status  03/05/2023 7.6 (A) 4.0 - 5.6 % Final   Hgb A1c MFr Bld  Date Value Ref Range Status  11/19/2021 7.2 (H) 4.8 - 5.6 % Final    Comment:             Prediabetes: 5.7 - 6.4          Diabetes: >6.4          Glycemic control for adults with diabetes: <7.0          Passed - eGFR in normal range and within 360 days    GFR, Est African American  Date Value Ref Range Status  09/27/2013 >89 mL/min Final   GFR calc Af Amer  Date Value Ref Range Status  02/28/2020 133 >59 mL/min/1.73 Final    Comment:    **In accordance with recommendations from the NKF-ASN Task force,**   [facility] is in the process of updating its eGFR calculation to the   2021 CKD-EPI creatinine equation that estimates kidney function   without a race variable.    GFR, Est Non African American  Date Value Ref Range Status  09/27/2013 >89 mL/min Final    Comment:      The estimated GFR is a calculation valid for adults (>=37 years old) that uses the CKD-EPI algorithm to adjust for age and sex. It is   not to be used for children, pregnant women, hospitalized patients,    patients on dialysis, or with rapidly changing kidney function. According to the NKDEP, eGFR >89 is normal, 60-89 shows mild impairment, 30-59 shows moderate impairment, 15-29 shows  severe impairment and <15 is ESRD.     GFR calc non Af Amer  Date Value Ref Range Status  02/28/2020 115 >59 mL/min/1.73 Final   eGFR  Date Value Ref Range Status  03/05/2023 114 >59 mL/min/1.73 Final         Passed - Valid encounter within last 6 months    Recent Outpatient Visits           1 month ago Diabetes mellitus treated with oral medication (HCC)   [facility] [place] [place], [place] W, NP   4 months ago Type 2 diabetes mellitus with hyperglycemia, without long-term current use of insulin (HCC)   [facility] Of [place]. [facility] [place], [place] W, NP   7 months ago Type 2 diabetes mellitus with hyperglycemia, without long-term current use of insulin (HCC)   [facility] - [facility] Dept Of [place]. [facility] [place], [place],  NP   10 months ago Type 2 diabetes mellitus with hyperglycemia, without long-term current use of insulin (HCC)   [facility] Dept Of [place]. [facility] [place], [place] W, NP   1 year ago Type 2 diabetes mellitus with hyperglycemia, without long-term current use of insulin (HCC)   [facility] - [facility] Dept Of [place]. [facility] Claiborne Rigg, NP       Future Appointments             In 1 month Claiborne Rigg, NP [facility] - A Dept Of [place]. [facility]

## 2023-04-15 ENCOUNTER — Other Ambulatory Visit: Payer: Self-pay

## 2023-05-10 DIAGNOSIS — Z419 Encounter for procedure for purposes other than remedying health state, unspecified: Secondary | ICD-10-CM | POA: Diagnosis not present

## 2023-05-23 ENCOUNTER — Other Ambulatory Visit: Payer: Self-pay | Admitting: Nurse Practitioner

## 2023-05-23 ENCOUNTER — Other Ambulatory Visit: Payer: Self-pay

## 2023-05-23 DIAGNOSIS — J301 Allergic rhinitis due to pollen: Secondary | ICD-10-CM

## 2023-05-23 MED ORDER — CETIRIZINE HCL 10 MG PO TABS
10.0000 mg | ORAL_TABLET | Freq: Every day | ORAL | 3 refills | Status: AC
  Filled 2023-05-23 (×2): qty 30, 30d supply, fill #0
  Filled 2023-06-25: qty 30, 30d supply, fill #1
  Filled 2023-09-09: qty 30, 30d supply, fill #2
  Filled 2023-12-01: qty 30, 30d supply, fill #3

## 2023-05-26 ENCOUNTER — Other Ambulatory Visit: Payer: Self-pay

## 2023-06-04 ENCOUNTER — Encounter: Payer: Self-pay | Admitting: Nurse Practitioner

## 2023-06-04 ENCOUNTER — Other Ambulatory Visit: Payer: Self-pay

## 2023-06-04 ENCOUNTER — Ambulatory Visit: Payer: Medicaid Other | Attending: Nurse Practitioner | Admitting: Nurse Practitioner

## 2023-06-04 VITALS — BP 114/78 | HR 74 | Resp 19 | Ht 61.0 in | Wt 148.8 lb

## 2023-06-04 DIAGNOSIS — Z7984 Long term (current) use of oral hypoglycemic drugs: Secondary | ICD-10-CM

## 2023-06-04 DIAGNOSIS — E7800 Pure hypercholesterolemia, unspecified: Secondary | ICD-10-CM | POA: Diagnosis not present

## 2023-06-04 DIAGNOSIS — E119 Type 2 diabetes mellitus without complications: Secondary | ICD-10-CM

## 2023-06-04 DIAGNOSIS — E1165 Type 2 diabetes mellitus with hyperglycemia: Secondary | ICD-10-CM | POA: Diagnosis not present

## 2023-06-04 LAB — POCT GLYCOSYLATED HEMOGLOBIN (HGB A1C): Hemoglobin A1C: 7.7 % — AB (ref 4.0–5.6)

## 2023-06-04 MED ORDER — EMPAGLIFLOZIN 25 MG PO TABS
25.0000 mg | ORAL_TABLET | Freq: Every day | ORAL | 1 refills | Status: DC
  Filled 2023-06-04 – 2023-09-09 (×2): qty 90, 90d supply, fill #0

## 2023-06-04 MED ORDER — LISINOPRIL 2.5 MG PO TABS
2.5000 mg | ORAL_TABLET | Freq: Every day | ORAL | 0 refills | Status: DC
  Filled 2023-06-04: qty 90, 90d supply, fill #0

## 2023-06-04 MED ORDER — ATORVASTATIN CALCIUM 40 MG PO TABS
40.0000 mg | ORAL_TABLET | Freq: Every day | ORAL | 1 refills | Status: DC
  Filled 2023-06-04 – 2023-09-09 (×2): qty 90, 90d supply, fill #0
  Filled 2023-12-01: qty 90, 90d supply, fill #1

## 2023-06-04 MED ORDER — JANUMET 50-500 MG PO TABS
1.0000 | ORAL_TABLET | Freq: Two times a day (BID) | ORAL | 1 refills | Status: DC
  Filled 2023-06-04 – 2023-06-25 (×2): qty 180, 90d supply, fill #0

## 2023-06-04 NOTE — Progress Notes (Signed)
 Assessment & Plan:  Veronica Love was seen today for diabetes.  Diagnoses and all orders for this visit:  Diabetes mellitus treated with oral medication (HCC) -     POCT glycosylated hemoglobin (Hb A1C) -     empagliflozin  (JARDIANCE ) 25 MG TABS tablet; Take 1 tablet (25 mg total) by mouth daily. FOR DIABETES -     lisinopril  (ZESTRIL ) 2.5 MG tablet; Take 1 tablet (2.5 mg total) by mouth daily. FOR KIDNEY PROTECTION -     sitaGLIPtin -metformin  (JANUMET ) 50-500 MG tablet; Take 1 tablet by mouth 2 (two) times daily with a meal FOR DIABETES. -     CMP14+EGFR -     Ambulatory referral to Ophthalmology  Hypercholesteremia -     atorvastatin  (LIPITOR) 40 MG tablet; Take 1 tablet (40 mg total) by mouth daily. FOR CHOLESTEROL  Hypercholesterolemia -     Lipid panel    Patient has been counseled on age-appropriate routine health concerns for screening and prevention. These are reviewed and up-to-date. Referrals have been placed accordingly. Immunizations are up-to-date or declined.    Subjective:   Chief Complaint  Patient presents with   Diabetes    Veronica Love 38 y.o. female presents to office today for DM2 fu  She has a past medical history of Diabetes mellitus, type 2, Gestational diabetes (2013), History of positive PPD (2009), Hypercholesteremia, and Subclinical hyperthyroidism (followed by endo).      She is accompanied by an onsite interpreter today.    She missed her appointment with [facility].  Will place another referral as she has been given their phone number to schedule.  DM2 She declines any GLP-1 or basal insulin .  She can not tolerate any increased dose of metformin .  She is currently taking Janumet  50-500 mg twice daily, glimepiride  4 mg twice daily and Jardiance  25 mg daily.  She is prescribed renal dose ACE.  States she will work on diet and exercise but does not want to take any injectable medication Lab Results  Component Value Date   HGBA1C 7.7 (A) 06/04/2023     Lab Results  Component Value Date   HGBA1C 7.6 (A) 03/05/2023  LDL not at goal of less than 70.  She is currently prescribed atorvastatin  40 mg daily Lab Results  Component Value Date   LDLCALC 89 05/22/2022     Review of Systems  Constitutional:  Negative for fever, malaise/fatigue and weight loss.  HENT: Negative.  Negative for nosebleeds.   Eyes: Negative.  Negative for blurred vision, double vision and photophobia.  Respiratory: Negative.  Negative for cough and shortness of breath.   Cardiovascular: Negative.  Negative for chest pain, palpitations and leg swelling.  Gastrointestinal: Negative.  Negative for heartburn, nausea and vomiting.  Musculoskeletal: Negative.  Negative for myalgias.  Neurological: Negative.  Negative for dizziness, focal weakness, seizures and headaches.  Psychiatric/Behavioral: Negative.  Negative for suicidal ideas.     Past Medical History:  Diagnosis Date   Diabetes mellitus, type 2 (HCC)    Gestational diabetes 2013   History of positive PPD 2009   with negative chest x-ray   Hypercholesteremia    No pertinent past medical history    Subclinical hyperthyroidism     Past Surgical History:  Procedure Laterality Date   NO PAST SURGERIES      Family History  Problem Relation Age of Onset   Diabetes Father     Social History Reviewed with no changes to be made today.  Outpatient Medications Prior to Visit  Medication Sig Dispense Refill   Accu-Chek Softclix Lancets lancets Use as instructed 100 each 12   Blood Glucose Monitoring Suppl (ACCU-CHEK GUIDE) w/Device KIT 1 each by Does not apply route daily. 1 kit 0   cetirizine  (ZYRTEC ) 10 MG tablet Take 1 tablet (10 mg total) by mouth daily. 90 tablet 3   etonogestrel  (NEXPLANON ) 68 MG IMPL implant Inject 1 each (68 mg total) into the skin once. 1 each 0   glimepiride  (AMARYL ) 4 MG tablet Take 1 tablet (4 mg total) by mouth 2 (two) times daily. 180 tablet 1   glucose blood test strip USE  AS INSTRUCTED. CHECK BLOOD GLUCOSE LEVEL BY FINGERSTICK TWICE PER DAY. 100 strip 2   methimazole  (TAPAZOLE ) 5 MG tablet Take 1 tablet (5 mg total) by mouth daily. 90 tablet 3   atorvastatin  (LIPITOR) 40 MG tablet Take 1 tablet (40 mg total) by mouth daily. 90 tablet 0   empagliflozin  (JARDIANCE ) 25 MG TABS tablet Take 1 tablet (25 mg total) by mouth daily. 90 tablet 0   lisinopril  (ZESTRIL ) 2.5 MG tablet Take 1 tablet (2.5 mg total) by mouth daily. 90 tablet 0   sitaGLIPtin -metformin  (JANUMET ) 50-500 MG tablet Take 1 tablet by mouth 2 (two) times daily with a meal. 180 tablet 1   No facility-administered medications prior to visit.    Allergies  Allergen Reactions   Metformin  And Related     Can not take 1000 mg of metformin  as it causes nausea and headaches.   Victoza  [Liraglutide ] Other (See Comments)    Dizziness       Objective:    BP 114/78 (BP Location: Left Arm, Patient Position: Sitting, Cuff Size: Normal)   Pulse 74   Resp 19   Ht 5\' 1"  (1.549 m)   Wt 148 lb 12.8 oz (67.5 kg)   LMP  (LMP Unknown)   SpO2 100%   BMI 28.12 kg/m  Wt Readings from Last 3 Encounters:  06/04/23 148 lb 12.8 oz (67.5 kg)  03/05/23 148 lb 3.2 oz (67.2 kg)  12/03/22 147 lb 12.8 oz (67 kg)    Physical Exam Vitals and nursing note reviewed.  Constitutional:      Appearance: She is well-developed.  HENT:     Head: Normocephalic and atraumatic.  Cardiovascular:     Rate and Rhythm: Normal rate and regular rhythm.     Heart sounds: Normal heart sounds. No murmur heard.    No friction rub. No gallop.  Pulmonary:     Effort: Pulmonary effort is normal. No tachypnea or respiratory distress.     Breath sounds: Normal breath sounds. No decreased breath sounds, wheezing, rhonchi or rales.  Chest:     Chest wall: No tenderness.  Abdominal:     General: Bowel sounds are normal.     Palpations: Abdomen is soft.  Musculoskeletal:        General: Normal range of motion.     Cervical back: Normal  range of motion.  Skin:    General: Skin is warm and dry.  Neurological:     Mental Status: She is alert and oriented to person, place, and time.     Coordination: Coordination normal.  Psychiatric:        Behavior: Behavior normal. Behavior is cooperative.        Thought Content: Thought content normal.        Judgment: Judgment normal.  Patient has been counseled extensively about nutrition and exercise as well as the importance of adherence with medications and regular follow-up. The patient was given clear instructions to go to ER or return to medical center if symptoms don't improve, worsen or new problems develop. The patient verbalized understanding.   Follow-up: Return in about 3 months (around 09/04/2023).   Collins Dean, FNP-BC [facility] and [facility] [place], [place] [phone]   06/04/2023, 11:48 AM

## 2023-06-04 NOTE — Patient Instructions (Signed)
 [facility]  ph # [phone]  address [address]  [place], [place]

## 2023-06-05 ENCOUNTER — Other Ambulatory Visit: Payer: Self-pay

## 2023-06-05 LAB — CMP14+EGFR
ALT: 24 IU/L (ref 0–32)
AST: 18 IU/L (ref 0–40)
Albumin: 4.7 g/dL (ref 3.9–4.9)
Alkaline Phosphatase: 69 IU/L (ref 44–121)
BUN/Creatinine Ratio: 25 — ABNORMAL HIGH (ref 9–23)
BUN: 16 mg/dL (ref 6–20)
Bilirubin Total: 0.3 mg/dL (ref 0.0–1.2)
CO2: 19 mmol/L — ABNORMAL LOW (ref 20–29)
Calcium: 9.4 mg/dL (ref 8.7–10.2)
Chloride: 102 mmol/L (ref 96–106)
Creatinine, Ser: 0.63 mg/dL (ref 0.57–1.00)
Globulin, Total: 3.3 g/dL (ref 1.5–4.5)
Glucose: 116 mg/dL — ABNORMAL HIGH (ref 70–99)
Potassium: 4.5 mmol/L (ref 3.5–5.2)
Sodium: 137 mmol/L (ref 134–144)
Total Protein: 8 g/dL (ref 6.0–8.5)
eGFR: 116 mL/min/1.73

## 2023-06-05 LAB — LIPID PANEL
Chol/HDL Ratio: 4.8 ratio — ABNORMAL HIGH (ref 0.0–4.4)
Cholesterol, Total: 211 mg/dL — ABNORMAL HIGH (ref 100–199)
HDL: 44 mg/dL
LDL Chol Calc (NIH): 144 mg/dL — ABNORMAL HIGH (ref 0–99)
Triglycerides: 126 mg/dL (ref 0–149)
VLDL Cholesterol Cal: 23 mg/dL (ref 5–40)

## 2023-06-09 DIAGNOSIS — Z419 Encounter for procedure for purposes other than remedying health state, unspecified: Secondary | ICD-10-CM | POA: Diagnosis not present

## 2023-06-12 ENCOUNTER — Ambulatory Visit: Payer: Self-pay | Admitting: Nurse Practitioner

## 2023-06-13 ENCOUNTER — Other Ambulatory Visit: Payer: Self-pay

## 2023-06-16 ENCOUNTER — Other Ambulatory Visit: Payer: Self-pay

## 2023-06-25 ENCOUNTER — Other Ambulatory Visit: Payer: Self-pay

## 2023-06-26 ENCOUNTER — Other Ambulatory Visit: Payer: Self-pay

## 2023-07-10 DIAGNOSIS — Z419 Encounter for procedure for purposes other than remedying health state, unspecified: Secondary | ICD-10-CM | POA: Diagnosis not present

## 2023-08-09 DIAGNOSIS — Z419 Encounter for procedure for purposes other than remedying health state, unspecified: Secondary | ICD-10-CM | POA: Diagnosis not present

## 2023-08-19 ENCOUNTER — Other Ambulatory Visit: Payer: Self-pay

## 2023-08-19 ENCOUNTER — Other Ambulatory Visit: Payer: Self-pay | Admitting: Nurse Practitioner

## 2023-08-19 DIAGNOSIS — E1165 Type 2 diabetes mellitus with hyperglycemia: Secondary | ICD-10-CM

## 2023-08-19 MED ORDER — ACCU-CHEK GUIDE W/DEVICE KIT
1.0000 | PACK | Freq: Every day | 0 refills | Status: AC
  Filled 2023-08-19: qty 1, 30d supply, fill #0

## 2023-09-05 ENCOUNTER — Ambulatory Visit: Admitting: Nurse Practitioner

## 2023-09-09 ENCOUNTER — Other Ambulatory Visit: Payer: Self-pay

## 2023-09-09 ENCOUNTER — Other Ambulatory Visit: Payer: Self-pay | Admitting: Family Medicine

## 2023-09-09 DIAGNOSIS — Z419 Encounter for procedure for purposes other than remedying health state, unspecified: Secondary | ICD-10-CM | POA: Diagnosis not present

## 2023-09-09 MED ORDER — ACCU-CHEK SOFTCLIX LANCETS MISC
0 refills | Status: DC
  Filled ????-??-??: fill #0

## 2023-09-10 ENCOUNTER — Telehealth: Payer: Self-pay

## 2023-09-10 NOTE — Telephone Encounter (Signed)
 Pharmacy Patient Advocate Encounter  Received notification from [facility] Medicaid that Prior Authorization for JARDIANCE  has been APPROVED from 08/26/2023 to 09/08/2024   PA #/Case ID/Reference #: [id number]

## 2023-09-12 ENCOUNTER — Other Ambulatory Visit: Payer: Self-pay

## 2023-09-15 ENCOUNTER — Other Ambulatory Visit: Payer: Self-pay

## 2023-09-15 ENCOUNTER — Ambulatory Visit: Attending: Nurse Practitioner | Admitting: Nurse Practitioner

## 2023-09-15 ENCOUNTER — Encounter: Payer: Self-pay | Admitting: Nurse Practitioner

## 2023-09-15 VITALS — BP 112/74 | HR 77 | Resp 19 | Ht 61.0 in | Wt 149.2 lb

## 2023-09-15 DIAGNOSIS — E7800 Pure hypercholesterolemia, unspecified: Secondary | ICD-10-CM

## 2023-09-15 DIAGNOSIS — E0590 Thyrotoxicosis, unspecified without thyrotoxic crisis or storm: Secondary | ICD-10-CM

## 2023-09-15 DIAGNOSIS — Z7984 Long term (current) use of oral hypoglycemic drugs: Secondary | ICD-10-CM

## 2023-09-15 DIAGNOSIS — E119 Type 2 diabetes mellitus without complications: Secondary | ICD-10-CM

## 2023-09-15 DIAGNOSIS — Z1231 Encounter for screening mammogram for malignant neoplasm of breast: Secondary | ICD-10-CM

## 2023-09-15 LAB — POCT GLYCOSYLATED HEMOGLOBIN (HGB A1C): Hemoglobin A1C: 7.5 % — AB (ref 4.0–5.6)

## 2023-09-15 MED ORDER — JANUMET 50-500 MG PO TABS
1.0000 | ORAL_TABLET | Freq: Two times a day (BID) | ORAL | 1 refills | Status: AC
  Filled 2023-09-15: qty 60, 30d supply, fill #0
  Filled 2023-09-30: qty 180, 90d supply, fill #0
  Filled 2023-12-01 – 2024-01-06 (×2): qty 180, 90d supply, fill #1

## 2023-09-15 MED ORDER — EMPAGLIFLOZIN 25 MG PO TABS
25.0000 mg | ORAL_TABLET | Freq: Every day | ORAL | 1 refills | Status: AC
  Filled 2023-09-15 – 2023-12-01 (×2): qty 90, 90d supply, fill #0

## 2023-09-15 MED ORDER — ACCU-CHEK SOFTCLIX LANCETS MISC
6 refills | Status: AC
Start: 2023-09-15 — End: ?
  Filled 2023-09-15 – 2023-12-01 (×2): qty 100, 50d supply, fill #0

## 2023-09-15 MED ORDER — GLUCOSE BLOOD VI STRP
ORAL_STRIP | 2 refills | Status: AC
  Filled 2023-09-15: qty 100, fill #0
  Filled 2023-12-01: qty 100, 50d supply, fill #0

## 2023-09-15 MED ORDER — GLIMEPIRIDE 4 MG PO TABS
4.0000 mg | ORAL_TABLET | Freq: Two times a day (BID) | ORAL | 1 refills | Status: AC
  Filled 2023-09-15 – 2024-02-05 (×2): qty 180, 90d supply, fill #0

## 2023-09-15 MED ORDER — LISINOPRIL 2.5 MG PO TABS
2.5000 mg | ORAL_TABLET | Freq: Every day | ORAL | 1 refills | Status: AC
  Filled 2023-09-15 – 2024-02-05 (×2): qty 90, 90d supply, fill #0

## 2023-09-15 NOTE — Progress Notes (Signed)
 Assessment & Plan:  [name] was seen today for diabetes.  Diagnoses and all orders for this visit:  Diabetes mellitus treated with oral medication (HCC) -     Accu-Chek Softclix Lancets lancets; Use to check twice daily. -     glimepiride  (AMARYL ) 4 MG tablet; Take 1 tablet (4 mg total) by mouth 2 (two) times daily. TAKE WITH BREAKFAST AND DINNER> FOR DIABETES -     glucose blood test strip; USE AS INSTRUCTED. CHECK BLOOD GLUCOSE LEVEL BY FINGERSTICK TWICE PER DAY. -     lisinopril  (ZESTRIL ) 2.5 MG tablet; Take 1 tablet (2.5 mg total) by mouth daily. FOR KIDNEY PROTECTION -     POCT glycosylated hemoglobin (Hb A1C) -     sitaGLIPtin -metformin  (JANUMET ) 50-500 MG tablet; Take 1 tablet by mouth 2 (two) times daily with a meal (BREAKFAST AND DINNER) FOR DIABETES. -     empagliflozin  (JARDIANCE ) 25 MG TABS tablet; Take 1 tablet (25 mg total) by mouth daily. FOR DIABETES. TAKE WITH LUNCH Recommend stricter control with dietary modifications.  Exercise 3-4 times per week. Walking 30-45 minutes daily.   Breast cancer screening by mammogram -     MM 3D SCREENING MAMMOGRAM BILATERAL BREAST; Future  Hypercholesteremia -     Lipid panel INSTRUCTIONS: Work on a low fat, heart healthy diet and participate in regular aerobic exercise program by working out at least 150 minutes per week; 5 days a week-30 minutes per day. Avoid red meat/beef/steak,  fried foods. junk foods, sodas, sugary drinks, unhealthy snacking, alcohol  and smoking.  Drink at least 80 oz of water per day and monitor your carbohydrate intake daily.    Hyperthyroidism -     Thyroid  Panel With TSH Followed by Endocrinology     Patient has been counseled on age-appropriate routine health concerns for screening and prevention. These are reviewed and up-to-date. Referrals have been placed accordingly. Immunizations are up-to-date or declined.    Subjective:   Chief Complaint  Patient presents with   Diabetes    Veronica Love 38 y.o.  female presents to office today in follow-up to hypertension, hyperlipidemia and diabetes  DM 2 A1c not at goal but has decreased today from 7.7 to 7.5.  She is currently taking Jardiance  25 mg daily, janumet  50-500 mg BID, and glimepiride  4 mg BID.  Lab Results  Component Value Date   HGBA1C 7.5 (A) 09/15/2023  LDL not at goal . She is prescribed high intensity statin. Lab Results  Component Value Date   LDLCALC 150 (H) 09/15/2023     Blood pressure is well controlled. She is taking renal dose lisinopril  2.5 mg daily.  BP Readings from Last 3 Encounters:  09/15/23 112/74  06/04/23 114/78  03/05/23 118/79    Review of Systems  Constitutional:  Negative for fever, malaise/fatigue and weight loss.  HENT: Negative.  Negative for nosebleeds.   Eyes: Negative.  Negative for blurred vision, double vision and photophobia.  Respiratory: Negative.  Negative for cough and shortness of breath.   Cardiovascular: Negative.  Negative for chest pain, palpitations and leg swelling.  Gastrointestinal: Negative.  Negative for heartburn, nausea and vomiting.  Musculoskeletal: Negative.  Negative for myalgias.  Neurological: Negative.  Negative for dizziness, focal weakness, seizures and headaches.  Psychiatric/Behavioral: Negative.  Negative for suicidal ideas.     Past Medical History:  Diagnosis Date   Diabetes mellitus, type 2 (HCC)    Gestational diabetes 2013   History of positive PPD 2009  with negative chest x-ray   Hypercholesteremia    No pertinent past medical history    Subclinical hyperthyroidism     Past Surgical History:  Procedure Laterality Date   NO PAST SURGERIES      Family History  Problem Relation Age of Onset   Diabetes Father     Social History Reviewed with no changes to be made today.   Outpatient Medications Prior to Visit  Medication Sig Dispense Refill   atorvastatin  (LIPITOR) 40 MG tablet Take 1 tablet (40 mg total) by mouth daily. FOR CHOLESTEROL 90  tablet 1   Blood Glucose Monitoring Suppl (ACCU-CHEK GUIDE) w/Device KIT Use as directed to check blood glucose. 1 kit 0   cetirizine  (ZYRTEC ) 10 MG tablet Take 1 tablet (10 mg total) by mouth daily. 90 tablet 3   etonogestrel  (NEXPLANON ) 68 MG IMPL implant Inject 1 each (68 mg total) into the skin once. 1 each 0   methimazole  (TAPAZOLE ) 5 MG tablet Take 1 tablet (5 mg total) by mouth daily. 90 tablet 3   Accu-Chek Softclix Lancets lancets Use to check twice daily. 100 each 0   empagliflozin  (JARDIANCE ) 25 MG TABS tablet Take 1 tablet (25 mg total) by mouth daily. FOR DIABETES 90 tablet 1   glimepiride  (AMARYL ) 4 MG tablet Take 1 tablet (4 mg total) by mouth 2 (two) times daily. 180 tablet 1   glucose blood test strip USE AS INSTRUCTED. CHECK BLOOD GLUCOSE LEVEL BY FINGERSTICK TWICE PER DAY. 100 strip 2   lisinopril  (ZESTRIL ) 2.5 MG tablet Take 1 tablet (2.5 mg total) by mouth daily. FOR KIDNEY PROTECTION 90 tablet 0   sitaGLIPtin -metformin  (JANUMET ) 50-500 MG tablet Take 1 tablet by mouth 2 (two) times daily with a meal FOR DIABETES. 180 tablet 1   No facility-administered medications prior to visit.    Allergies  Allergen Reactions   Metformin  And Related     Can not take 1000 mg of metformin  as it causes nausea and headaches.   Victoza  [Liraglutide ] Other (See Comments)    Dizziness       Objective:    BP 112/74 (BP Location: Left Arm, Patient Position: Sitting, Cuff Size: Normal)   Pulse 77   Resp 19   Ht 5' 1 (1.549 m)   Wt 149 lb 3.2 oz (67.7 kg)   SpO2 99%   BMI 28.19 kg/m  Wt Readings from Last 3 Encounters:  09/15/23 149 lb 3.2 oz (67.7 kg)  06/04/23 148 lb 12.8 oz (67.5 kg)  03/05/23 148 lb 3.2 oz (67.2 kg)    Physical Exam Vitals and nursing note reviewed.  Constitutional:      Appearance: She is well-developed.  HENT:     Head: Normocephalic and atraumatic.  Cardiovascular:     Rate and Rhythm: Normal rate and regular rhythm.     Heart sounds: Normal heart  sounds. No murmur heard.    No friction rub. No gallop.  Pulmonary:     Effort: Pulmonary effort is normal. No tachypnea or respiratory distress.     Breath sounds: Normal breath sounds. No decreased breath sounds, wheezing, rhonchi or rales.  Chest:     Chest wall: No tenderness.  Musculoskeletal:        General: Normal range of motion.     Cervical back: Normal range of motion.  Skin:    General: Skin is warm and dry.  Neurological:     Mental Status: She is alert and oriented to person, place, and  time.     Coordination: Coordination normal.  Psychiatric:        Behavior: Behavior normal. Behavior is cooperative.        Thought Content: Thought content normal.        Judgment: Judgment normal.          Patient has been counseled extensively about nutrition and exercise as well as the importance of adherence with medications and regular follow-up. The patient was given clear instructions to go to ER or return to medical center if symptoms don't improve, worsen or new problems develop. The patient verbalized understanding.   Follow-up: Return in about 3 months (around 12/19/2023).   Haze LELON Servant, FNP-BC [facility] [place], [place] [phone]   10/05/2023, 9:05 PM

## 2023-09-15 NOTE — Patient Instructions (Addendum)
 JANUMET  AND GLIMEPIRIDE  WITH LUNCH AND BREAKFAST   JANUVIA  WITH LUNCH    DRI [facility] Located in: [facility] Address: [address], [place], [place] [address] Phone: [phone]

## 2023-09-16 LAB — THYROID PANEL WITH TSH
Free Thyroxine Index: 1.9 (ref 1.2–4.9)
T3 Uptake Ratio: 24 % (ref 24–39)
T4, Total: 8.1 ug/dL (ref 4.5–12.0)
TSH: 0.302 u[IU]/mL — ABNORMAL LOW (ref 0.450–4.500)

## 2023-09-16 LAB — LIPID PANEL
Chol/HDL Ratio: 5.1 ratio — ABNORMAL HIGH (ref 0.0–4.4)
Cholesterol, Total: 233 mg/dL — ABNORMAL HIGH (ref 100–199)
HDL: 46 mg/dL (ref >39)
LDL Chol Calc (NIH): 150 mg/dL — ABNORMAL HIGH (ref 0–99)
Triglycerides: 201 mg/dL — ABNORMAL HIGH (ref 0–149)
VLDL Cholesterol Cal: 37 mg/dL (ref 5–40)

## 2023-09-17 ENCOUNTER — Ambulatory Visit: Payer: Self-pay | Admitting: Nurse Practitioner

## 2023-09-17 ENCOUNTER — Other Ambulatory Visit: Payer: Self-pay

## 2023-09-18 ENCOUNTER — Other Ambulatory Visit: Payer: Self-pay

## 2023-09-24 ENCOUNTER — Other Ambulatory Visit: Payer: Self-pay

## 2023-09-30 ENCOUNTER — Other Ambulatory Visit: Payer: Self-pay

## 2023-10-01 ENCOUNTER — Other Ambulatory Visit: Payer: Self-pay

## 2023-10-02 ENCOUNTER — Other Ambulatory Visit: Payer: Self-pay

## 2023-10-05 ENCOUNTER — Encounter: Payer: Self-pay | Admitting: Nurse Practitioner

## 2023-10-10 DIAGNOSIS — Z419 Encounter for procedure for purposes other than remedying health state, unspecified: Secondary | ICD-10-CM | POA: Diagnosis not present

## 2023-10-14 ENCOUNTER — Ambulatory Visit
Admission: RE | Admit: 2023-10-14 | Discharge: 2023-10-14 | Disposition: A | Discharge status: Home / Self Care | Source: Ambulatory Visit | Attending: Nurse Practitioner | Admitting: Nurse Practitioner

## 2023-10-14 DIAGNOSIS — Z1231 Encounter for screening mammogram for malignant neoplasm of breast: Secondary | ICD-10-CM

## 2023-10-17 ENCOUNTER — Telehealth: Payer: Self-pay | Admitting: Nurse Practitioner

## 2023-10-17 NOTE — Telephone Encounter (Signed)
 FYI

## 2023-10-17 NOTE — Telephone Encounter (Signed)
 Copied from CRM [id number]. Topic: Clinical - Lab/Test Results >> Oct 16, 2023 10:59 AM Emylou G wrote:  Reason for CRM: adv patient of her lab results

## 2023-10-20 NOTE — Telephone Encounter (Signed)
 Noted

## 2023-11-18 ENCOUNTER — Ambulatory Visit: Payer: Medicaid Other | Admitting: Internal Medicine

## 2023-11-18 ENCOUNTER — Encounter: Payer: Self-pay | Admitting: Internal Medicine

## 2023-11-18 ENCOUNTER — Other Ambulatory Visit

## 2023-11-18 VITALS — BP 110/70 | HR 76 | Ht 61.0 in | Wt 149.0 lb

## 2023-11-18 DIAGNOSIS — E0590 Thyrotoxicosis, unspecified without thyrotoxic crisis or storm: Secondary | ICD-10-CM

## 2023-11-18 LAB — TSH: TSH: 0.33 m[IU]/L — ABNORMAL LOW

## 2023-11-18 LAB — T4, FREE: Free T4: 1.2 ng/dL (ref 0.8–1.8)

## 2023-11-18 NOTE — Patient Instructions (Addendum)
 I have ordered a thyroid  scan called  thyroid  uptake and scan, this will be done at [place] [facility], please remember to STOP methimazole  a week before the scan, and we started after the scan is completed        The location will be at [facility]: Nuclear medicine department   [address] [place], [place], [place] [address]

## 2023-11-18 NOTE — Progress Notes (Unsigned)
 Name: Veronica Love  MRN/ DOB: [id number], 03/28/1985    Age/ Sex: 38 y.o., female     PCP: Theotis Haze ORN, NP   Reason for Endocrinology Evaluation: Low TSH      Initial Endocrinology Clinic Visit: 03/03/2018    PATIENT IDENTIFIER: Veronica Love is a 38 y.o., female with a past medical history of  Dyslipidemia and T2DM.SABRA She has followed with [facility] since 03/03/2018 for consultative assistance with management of her low TSH    HISTORICAL SUMMARY:  Pt was noted to have low TSH at 0.232 uIU/mL during routine lab workup in 08/2017. Repeat labs in 11/2017 confirmed similar results.  Thyroid  ultrasound in 02/2018 showed no evidence of thyroid  nodules.   An order for thyroid  uptake and scan was order in 05/2019 but this has not been done as the pt did not know where to go   Methimazole  was started 11/2019  TRAb was detectable but not elevated at 1.06 international unit /L in 2020  SUBJECTIVE:   Today (11/18/2023):  Ms. Scantlebury is here for a follow up on subclinical hyperthyroidism.   No local neck swelling  No palpitations  No tremors  No constipation or diarrhea  No eye symptoms   She is on Nexplanon    Methimazole  5 mg daily      HISTORY:  Past Medical History:  Past Medical History:  Diagnosis Date   Diabetes mellitus, type 2 (HCC)    Gestational diabetes 2013   History of positive PPD 2009   with negative chest x-ray   Hypercholesteremia    No pertinent past medical history    Subclinical hyperthyroidism    Past Surgical History:  Past Surgical History:  Procedure Laterality Date   NO PAST SURGERIES     Social History:  reports that she has never smoked. She has never used smokeless tobacco. She reports that she does not drink alcohol  and does not use drugs. Family History:  Family History  Problem Relation Age of Onset   Diabetes Father      HOME MEDICATIONS: Allergies as of 11/18/2023       Reactions   Metformin  And Related    Can not  take 1000 mg of metformin  as it causes nausea and headaches.   Victoza  [liraglutide ] Other (See Comments)   Dizziness        Medication List        Accurate as of November 18, 2023  9:49 AM. If you have any questions, ask your nurse or doctor.          Accu-Chek Guide w/Device Kit Use as directed to check blood glucose.   Accu-Chek Softclix Lancets lancets Use to check twice daily.   atorvastatin  40 MG tablet Commonly known as: LIPITOR Take 1 tablet (40 mg total) by mouth daily. FOR CHOLESTEROL   cetirizine  10 MG tablet Commonly known as: ZYRTEC  Take 1 tablet (10 mg total) by mouth daily.   empagliflozin  25 MG Tabs tablet Commonly known as: JARDIANCE  Take 1 tablet (25 mg total) by mouth daily. FOR DIABETES. TAKE WITH LUNCH   etonogestrel  68 MG Impl implant Commonly known as: Nexplanon  Inject 1 each (68 mg total) into the skin once.   glimepiride  4 MG tablet Commonly known as: AMARYL  Take 1 tablet (4 mg total) by mouth 2 (two) times daily. TAKE WITH BREAKFAST AND DINNER> FOR DIABETES   glucose blood test strip USE AS INSTRUCTED. CHECK BLOOD GLUCOSE LEVEL BY FINGERSTICK TWICE PER DAY.  Janumet  50-500 MG tablet Generic drug: sitaGLIPtin -metformin  Take 1 tablet by mouth 2 (two) times daily with a meal (BREAKFAST AND DINNER) FOR DIABETES.   lisinopril  2.5 MG tablet Commonly known as: ZESTRIL  Take 1 tablet (2.5 mg total) by mouth daily. FOR KIDNEY PROTECTION   methimazole  5 MG tablet Commonly known as: TAPAZOLE  Take 1 tablet (5 mg total) by mouth daily.          OBJECTIVE:   PHYSICAL EXAM: VS: BP 110/70 (BP Location: Left Arm, Patient Position: Sitting, Cuff Size: Normal)   Pulse 76   Ht 5' 1 (1.549 m)   Wt 149 lb (67.6 kg)   SpO2 99%   BMI 28.15 kg/m     EXAM: General: Pt appears well and is in NAD  Neck: General: Supple without adenopathy. Thyroid : No goiter or nodules appreciated.  Lungs: Clear with good BS bilat   Heart: Auscultation:  RRR.  Abdomen: soft, nontender  Extremities:  BL LE: No pretibial edema   Mental Status: Judgment, insight: Intact Orientation: Oriented to time, place, and person Mood and affect: No depression, anxiety, or agitation     DATA REVIEWED:     Results for Veronica Love (MRN [id number]) as of 06/02/2018 07:55  Ref. Range 03/03/2018 09:10  TRAB Latest Ref Range: <=2.00 IU/L 1.06    Thyroid  Ultrasound 03/17/2018 There are no discrete nodules which meet criteria for biopsy nor follow-up. The right lobe is larger than the left.     ASSESSMENT / PLAN / RECOMMENDATIONS:   Hyperthyroidism :  - Patient is clinically euthyroid -Differential diagnosis includes Graves' disease versus autonomous thyroid  nodules - We have discussed proceeding with thyroid  uptake and scan, I did explain to the patient that this will be a 2-day process, the patient was advised to HOLD methimazole  a week prior to the scan and to resume as soon as the scanning is completed -I have again encouraged the patient to obtain a pillbox, as she admits to imperfect adherence to methimazole     Medication  Continue methimazole  5 mg, 1 tabs daily    F/u in  6 months  I spent 25 minutes preparing to see the patient by review of recent labs, imaging and procedures, obtaining and reviewing separately obtained history, communicating with the patient, ordering medications, tests or procedures, and documenting clinical information in the EHR including the differential Dx, treatment, and any further evaluation and other management    Signed electronically by: Stefano Redgie Butts, MD  [facility]  [facility] [address] [place] [place], [place] [address] Phone: [phone] FAX: [phone]      CC: Theotis Haze ORN, NP [address] [place] [address] [place] [address] Phone: [phone]  Fax: [phone]   Return to Endocrinology clinic as below: Future Appointments  Date Time  Provider Department Center  11/18/2023 11:00 AM [facility] ENDO/NEURO LAB [facility] None  12/22/2023  9:30 AM Theotis Haze ORN, NP [facility] [address]  05/18/2024  9:30 AM Cleophas Yoak, Donell Redgie, MD [facility] None

## 2023-11-20 ENCOUNTER — Ambulatory Visit: Payer: Self-pay | Admitting: Internal Medicine

## 2023-11-20 ENCOUNTER — Other Ambulatory Visit: Payer: Self-pay

## 2023-11-20 MED ORDER — METHIMAZOLE 5 MG PO TABS
5.0000 mg | ORAL_TABLET | ORAL | 3 refills | Status: AC
  Filled 2023-11-20: qty 96, 90d supply, fill #0

## 2023-11-20 NOTE — Telephone Encounter (Signed)
 Please contact the patient (no interpreter needed) and let the patient know that her thyroid  is slightly overactive, she needs more methimazole .   Please take 2 methimazole 's on Sundays, and 1 methimazole  Monday through Saturday from now on   Please remind the patient that she will still need to discontinue methimazole  a week before her thyroid  scan and to resume these recommendations after the scan is complete    Thanks

## 2023-11-28 ENCOUNTER — Other Ambulatory Visit: Payer: Self-pay

## 2023-12-01 ENCOUNTER — Other Ambulatory Visit: Payer: Self-pay

## 2023-12-01 ENCOUNTER — Ambulatory Visit (HOSPITAL_COMMUNITY)
Admission: RE | Admit: 2023-12-01 | Discharge: 2023-12-01 | Disposition: A | Discharge status: Home / Self Care | Source: Ambulatory Visit | Attending: Internal Medicine | Admitting: Internal Medicine

## 2023-12-01 ENCOUNTER — Ambulatory Visit (HOSPITAL_COMMUNITY): Admission: RE | Admit: 2023-12-01 | Source: Ambulatory Visit

## 2023-12-01 DIAGNOSIS — E0590 Thyrotoxicosis, unspecified without thyrotoxic crisis or storm: Secondary | ICD-10-CM | POA: Insufficient documentation

## 2023-12-02 ENCOUNTER — Ambulatory Visit (HOSPITAL_COMMUNITY)

## 2023-12-08 ENCOUNTER — Encounter (HOSPITAL_COMMUNITY)
Admission: RE | Admit: 2023-12-08 | Discharge: 2023-12-08 | Disposition: A | Discharge status: Home / Self Care | Source: Ambulatory Visit | Attending: Internal Medicine | Admitting: Internal Medicine

## 2023-12-08 ENCOUNTER — Other Ambulatory Visit (HOSPITAL_COMMUNITY)

## 2023-12-08 DIAGNOSIS — E0590 Thyrotoxicosis, unspecified without thyrotoxic crisis or storm: Secondary | ICD-10-CM | POA: Diagnosis present

## 2023-12-08 MED ORDER — SODIUM IODIDE I-123 7.4 MBQ CAPS
450.0000 | ORAL_CAPSULE | Freq: Once | ORAL | Status: AC
  Administered 2023-12-08: 450 via ORAL

## 2023-12-09 ENCOUNTER — Encounter (HOSPITAL_COMMUNITY)
Admission: RE | Admit: 2023-12-09 | Discharge: 2023-12-09 | Disposition: A | Discharge status: Home / Self Care | Source: Ambulatory Visit | Attending: Internal Medicine | Admitting: Internal Medicine

## 2023-12-09 DIAGNOSIS — E0590 Thyrotoxicosis, unspecified without thyrotoxic crisis or storm: Secondary | ICD-10-CM | POA: Diagnosis not present

## 2023-12-10 DIAGNOSIS — Z419 Encounter for procedure for purposes other than remedying health state, unspecified: Secondary | ICD-10-CM | POA: Diagnosis not present

## 2023-12-22 ENCOUNTER — Other Ambulatory Visit: Payer: Self-pay

## 2023-12-22 ENCOUNTER — Ambulatory Visit: Attending: Nurse Practitioner | Admitting: Nurse Practitioner

## 2023-12-22 ENCOUNTER — Encounter: Payer: Self-pay | Admitting: Nurse Practitioner

## 2023-12-22 VITALS — BP 112/74 | HR 82 | Resp 19 | Ht 61.0 in | Wt 150.8 lb

## 2023-12-22 DIAGNOSIS — E7800 Pure hypercholesterolemia, unspecified: Secondary | ICD-10-CM | POA: Diagnosis not present

## 2023-12-22 DIAGNOSIS — Z7984 Long term (current) use of oral hypoglycemic drugs: Secondary | ICD-10-CM | POA: Diagnosis not present

## 2023-12-22 DIAGNOSIS — E119 Type 2 diabetes mellitus without complications: Secondary | ICD-10-CM

## 2023-12-22 DIAGNOSIS — Z23 Encounter for immunization: Secondary | ICD-10-CM

## 2023-12-22 LAB — POCT GLYCOSYLATED HEMOGLOBIN (HGB A1C): Hemoglobin A1C: 8 % — AB (ref 4.0–5.6)

## 2023-12-22 MED ORDER — ATORVASTATIN CALCIUM 40 MG PO TABS
40.0000 mg | ORAL_TABLET | Freq: Every day | ORAL | 1 refills | Status: AC
  Filled 2023-12-22: qty 90, 90d supply, fill #0

## 2023-12-22 NOTE — Progress Notes (Signed)
 "  Assessment & Plan:  Veronica Love was seen today for diabetes.  Diagnoses and all orders for this visit:  Diabetes mellitus treated with oral medication (HCC) -     POCT glycosylated hemoglobin (Hb A1C) -     Urine Albumin/Creatinine with ratio (send out) [LAB689] -     CMP14+EGFR -     CBC with Differential A1c increased to 8.0, likely due to hyperthyroidism. Blood sugars average in the high 140s with variability. She declined Ozempic due to fear of side effects. Discussed potential future use of Ozempic, Trulicity, or increasing glimepiride  if blood sugars remain high. - Continue glimepiride  4 mg twice daily. - Monitor blood sugars regularly. - Consider increasing glimepiride  to 8 mg if blood sugars remain high.   Need for influenza vaccination -     Flu vaccine trivalent PF, 6mos and older(Flulaval,Afluria,Fluarix,Fluzone)  Hypercholesteremia -     atorvastatin  (LIPITOR) 40 MG tablet; Take 1 tablet (40 mg total) by mouth daily. FOR CHOLESTEROL -     Lipid panel Strongly recommended medication adherence  UTD with MAMMOGRAM and PAP SMEAR   Patient has been counseled on age-appropriate routine health concerns for screening and prevention. These are reviewed and up-to-date. Referrals have been placed accordingly. Immunizations are up-to-date or declined.    Subjective:   Chief Complaint  Patient presents with   Diabetes   History of Present Illness Veronica Love is a 38 year old female with hyperthyroidism and diabetes who presents with elevated blood sugar levels.  She is accompanied by an onsite interpreter.  DM 2 Her blood sugar level has increased to 8.0, up from 7.5 previously. She monitors her blood sugar levels in the morning, afternoon, and at dinner, noting fluctuations with an average in the higher 140s. She is currently taking glimepiride  4 mg twice a day for her diabetes. She has previously used Victoza  but is hesitant to try Ozempic or similar medications at this time. Meter  averages as follows:  7 day 137 14 day 143 30 day 145 90 day 149 Lab Results  Component Value Date   HGBA1C 8.0 (A) 12/22/2023     She is followed by endocrinology for her hyperthyroidism. She is currently taking methimazole   LDL not at goal with high intensity statin. It does not appear she has picked up her prescription recently. Lab Results  Component Value Date   LDLCALC 102 (H) 12/22/2023    Review of Systems  Constitutional:  Negative for fever, malaise/fatigue and weight loss.  HENT: Negative.  Negative for nosebleeds.   Eyes: Negative.  Negative for blurred vision, double vision and photophobia.  Respiratory: Negative.  Negative for cough and shortness of breath.   Cardiovascular: Negative.  Negative for chest pain, palpitations and leg swelling.  Gastrointestinal: Negative.  Negative for heartburn, nausea and vomiting.  Musculoskeletal: Negative.  Negative for myalgias.  Neurological: Negative.  Negative for dizziness, focal weakness, seizures and headaches.  Psychiatric/Behavioral: Negative.  Negative for suicidal ideas.     Past Medical History:  Diagnosis Date   Diabetes mellitus, type 2 (HCC)    Gestational diabetes 2013   History of positive PPD 2009   with negative chest x-ray   Hypercholesteremia    No pertinent past medical history    Subclinical hyperthyroidism     Past Surgical History:  Procedure Laterality Date   NO PAST SURGERIES      Family History  Problem Relation Age of Onset   Diabetes Father     Social  History Reviewed with no changes to be made today.   Outpatient Medications Prior to Visit  Medication Sig Dispense Refill   Accu-Chek Softclix Lancets lancets Use to check twice daily. 100 each 6   Blood Glucose Monitoring Suppl (ACCU-CHEK GUIDE) w/Device KIT Use as directed to check blood glucose. 1 kit 0   cetirizine  (ZYRTEC ) 10 MG tablet Take 1 tablet (10 mg total) by mouth daily. 90 tablet 3   empagliflozin  (JARDIANCE ) 25 MG TABS  tablet Take 1 tablet (25 mg total) by mouth daily. FOR DIABETES. TAKE WITH LUNCH 90 tablet 1   etonogestrel  (NEXPLANON ) 68 MG IMPL implant Inject 1 each (68 mg total) into the skin once. 1 each 0   glimepiride  (AMARYL ) 4 MG tablet Take 1 tablet (4 mg total) by mouth 2 (two) times daily. TAKE WITH BREAKFAST AND DINNER> FOR DIABETES 180 tablet 1   glucose blood test strip USE AS INSTRUCTED. CHECK BLOOD GLUCOSE LEVEL BY FINGERSTICK TWICE PER DAY. 100 strip 2   lisinopril  (ZESTRIL ) 2.5 MG tablet Take 1 tablet (2.5 mg total) by mouth daily. FOR KIDNEY PROTECTION 90 tablet 1   methimazole  (TAPAZOLE ) 5 MG tablet Take 1 tablet (5 mg total) by mouth as directed. 2 tablets on Sundays and 1 tablet Monday through Saturday 96 tablet 3   sitaGLIPtin -metformin  (JANUMET ) 50-500 MG tablet Take 1 tablet by mouth 2 (two) times daily with a meal (BREAKFAST AND DINNER) FOR DIABETES. 180 tablet 1   atorvastatin  (LIPITOR) 40 MG tablet Take 1 tablet (40 mg total) by mouth daily. FOR CHOLESTEROL 90 tablet 1   metFORMIN  (GLUCOPHAGE ) 500 MG tablet Take 1 tablet (500 mg total) by mouth 2 (two) times daily with a meal. 180 tablet 1   No facility-administered medications prior to visit.    Allergies  Allergen Reactions   Metformin  And Related     Can not take 1000 mg of metformin  as it causes nausea and headaches.   Victoza  [Liraglutide ] Other (See Comments)    Dizziness       Objective:    BP 112/74 (BP Location: Left Arm, Patient Position: Sitting, Cuff Size: Normal)   Pulse 82   Resp 19   Ht 5' 1 (1.549 m)   Wt 150 lb 12.8 oz (68.4 kg)   LMP  (LMP Unknown)   SpO2 100%   BMI 28.49 kg/m  Wt Readings from Last 3 Encounters:  12/22/23 150 lb 12.8 oz (68.4 kg)  11/18/23 149 lb (67.6 kg)  09/15/23 149 lb 3.2 oz (67.7 kg)    Physical Exam Vitals and nursing note reviewed.  Constitutional:      Appearance: She is well-developed.  HENT:     Head: Normocephalic and atraumatic.  Cardiovascular:     Rate and  Rhythm: Normal rate and regular rhythm.     Heart sounds: Normal heart sounds. No murmur heard.    No friction rub. No gallop.  Pulmonary:     Effort: Pulmonary effort is normal. No tachypnea or respiratory distress.     Breath sounds: Normal breath sounds. No decreased breath sounds, wheezing, rhonchi or rales.  Chest:     Chest wall: No tenderness.  Abdominal:     General: Bowel sounds are normal.     Palpations: Abdomen is soft.  Musculoskeletal:        General: Normal range of motion.     Cervical back: Normal range of motion.  Skin:    General: Skin is warm and dry.  Neurological:  Mental Status: She is alert and oriented to person, place, and time.     Coordination: Coordination normal.  Psychiatric:        Behavior: Behavior normal. Behavior is cooperative.        Thought Content: Thought content normal.        Judgment: Judgment normal.          Patient has been counseled extensively about nutrition and exercise as well as the importance of adherence with medications and regular follow-up. The patient was given clear instructions to go to ER or return to medical center if symptoms don't improve, worsen or new problems develop. The patient verbalized understanding.   Follow-up: Return in about 3 months (around 04/02/2024).   Haze LELON Servant, FNP-BC [facility] [place], [place] [phone]   01/20/2024, 3:40 PM "

## 2023-12-24 ENCOUNTER — Ambulatory Visit: Payer: Self-pay | Admitting: Nurse Practitioner

## 2023-12-24 LAB — CMP14+EGFR
ALT: 30 IU/L (ref 0–32)
AST: 18 IU/L (ref 0–40)
Albumin: 4.6 g/dL (ref 3.9–4.9)
Alkaline Phosphatase: 73 IU/L (ref 41–116)
BUN/Creatinine Ratio: 25 — ABNORMAL HIGH (ref 9–23)
BUN: 14 mg/dL (ref 6–24)
Bilirubin Total: 0.3 mg/dL (ref 0.0–1.2)
CO2: 21 mmol/L (ref 20–29)
Calcium: 9.3 mg/dL (ref 8.7–10.2)
Chloride: 104 mmol/L (ref 96–106)
Creatinine, Ser: 0.57 mg/dL (ref 0.57–1.00)
Globulin, Total: 3 g/dL (ref 1.5–4.5)
Glucose: 102 mg/dL — ABNORMAL HIGH (ref 70–99)
Potassium: 4 mmol/L (ref 3.5–5.2)
Sodium: 140 mmol/L (ref 134–144)
Total Protein: 7.6 g/dL (ref 6.0–8.5)
eGFR: 118 mL/min/1.73 (ref >59)

## 2023-12-24 LAB — CBC WITH DIFFERENTIAL/PLATELET
Basophils Absolute: 0.1 x10E3/uL (ref 0.0–0.2)
Basos: 1 %
EOS (ABSOLUTE): 0.1 x10E3/uL (ref 0.0–0.4)
Eos: 1 %
Hematocrit: 45.5 % (ref 34.0–46.6)
Hemoglobin: 14.2 g/dL (ref 11.1–15.9)
Immature Grans (Abs): 0 x10E3/uL (ref 0.0–0.1)
Immature Granulocytes: 0 %
Lymphocytes Absolute: 3.1 x10E3/uL (ref 0.7–3.1)
Lymphs: 39 %
MCH: 24.2 pg — ABNORMAL LOW (ref 26.6–33.0)
MCHC: 31.2 g/dL — ABNORMAL LOW (ref 31.5–35.7)
MCV: 78 fL — ABNORMAL LOW (ref 79–97)
Monocytes Absolute: 0.5 x10E3/uL (ref 0.1–0.9)
Monocytes: 6 %
Neutrophils Absolute: 4.3 x10E3/uL (ref 1.4–7.0)
Neutrophils: 53 %
Platelets: 269 x10E3/uL (ref 150–450)
RBC: 5.86 x10E6/uL — ABNORMAL HIGH (ref 3.77–5.28)
RDW: 14.6 % (ref 11.7–15.4)
WBC: 8.1 x10E3/uL (ref 3.4–10.8)

## 2023-12-24 LAB — MICROALBUMIN / CREATININE URINE RATIO
Creatinine, Urine: 49 mg/dL
Microalb/Creat Ratio: <6 mg/g{creat} (ref 0–29)
Microalbumin, Urine: <3 ug/mL

## 2023-12-24 LAB — LIPID PANEL
Chol/HDL Ratio: 3.9 ratio (ref 0.0–4.4)
Cholesterol, Total: 163 mg/dL (ref 100–199)
HDL: 42 mg/dL (ref >39)
LDL Chol Calc (NIH): 102 mg/dL — ABNORMAL HIGH (ref 0–99)
Triglycerides: 103 mg/dL (ref 0–149)
VLDL Cholesterol Cal: 19 mg/dL (ref 5–40)

## 2024-01-06 ENCOUNTER — Other Ambulatory Visit: Payer: Self-pay

## 2024-01-07 ENCOUNTER — Other Ambulatory Visit: Payer: Self-pay

## 2024-01-09 DIAGNOSIS — Z419 Encounter for procedure for purposes other than remedying health state, unspecified: Secondary | ICD-10-CM | POA: Diagnosis not present

## 2024-01-20 ENCOUNTER — Encounter: Payer: Self-pay | Admitting: Nurse Practitioner

## 2024-02-05 ENCOUNTER — Other Ambulatory Visit: Payer: Self-pay

## 2024-04-05 ENCOUNTER — Ambulatory Visit: Admitting: Nurse Practitioner

## 2024-05-18 ENCOUNTER — Ambulatory Visit: Admitting: Internal Medicine
